# Patient Record
Sex: Male | Born: 1965 | Race: White | Hispanic: No | Marital: Married | Smoking: Current every day smoker
Health system: Southern US, Community
[De-identification: ages and names within clinical notes are randomized; demographics above are authoritative.]

## PROBLEM LIST (undated history)

## (undated) DIAGNOSIS — Z1211 Encounter for screening for malignant neoplasm of colon: Secondary | ICD-10-CM

## (undated) DIAGNOSIS — G5603 Carpal tunnel syndrome, bilateral upper limbs: Secondary | ICD-10-CM

## (undated) DIAGNOSIS — M792 Neuralgia and neuritis, unspecified: Secondary | ICD-10-CM

## (undated) DIAGNOSIS — E039 Hypothyroidism, unspecified: Secondary | ICD-10-CM

## (undated) DIAGNOSIS — R748 Abnormal levels of other serum enzymes: Secondary | ICD-10-CM

## (undated) DIAGNOSIS — E871 Hypo-osmolality and hyponatremia: Secondary | ICD-10-CM

## (undated) DIAGNOSIS — M4802 Spinal stenosis, cervical region: Secondary | ICD-10-CM

## (undated) DIAGNOSIS — M549 Dorsalgia, unspecified: Secondary | ICD-10-CM

## (undated) DIAGNOSIS — R0683 Snoring: Secondary | ICD-10-CM

## (undated) DIAGNOSIS — I700 Atherosclerosis of aorta: Secondary | ICD-10-CM

## (undated) DIAGNOSIS — R202 Paresthesia of skin: Secondary | ICD-10-CM

## (undated) DIAGNOSIS — Z8739 Personal history of other diseases of the musculoskeletal system and connective tissue: Secondary | ICD-10-CM

## (undated) DIAGNOSIS — M19031 Primary osteoarthritis, right wrist: Secondary | ICD-10-CM

## (undated) DIAGNOSIS — M19041 Primary osteoarthritis, right hand: Secondary | ICD-10-CM

## (undated) DIAGNOSIS — K760 Fatty (change of) liver, not elsewhere classified: Secondary | ICD-10-CM

## (undated) DIAGNOSIS — E139 Other specified diabetes mellitus without complications: Secondary | ICD-10-CM

## (undated) DIAGNOSIS — B070 Plantar wart: Secondary | ICD-10-CM

## (undated) DIAGNOSIS — M19032 Primary osteoarthritis, left wrist: Secondary | ICD-10-CM

## (undated) DIAGNOSIS — K3189 Other diseases of stomach and duodenum: Secondary | ICD-10-CM

## (undated) DIAGNOSIS — E785 Hyperlipidemia, unspecified: Secondary | ICD-10-CM

## (undated) DIAGNOSIS — G43909 Migraine, unspecified, not intractable, without status migrainosus: Secondary | ICD-10-CM

## (undated) DIAGNOSIS — F17200 Nicotine dependence, unspecified, uncomplicated: Secondary | ICD-10-CM

## (undated) DIAGNOSIS — I2510 Atherosclerotic heart disease of native coronary artery without angina pectoris: Secondary | ICD-10-CM

## (undated) HISTORY — PX: OTHER SURGICAL HISTORY: SHX169

## (undated) HISTORY — DX: Personal history of other diseases of the musculoskeletal system and connective tissue: Z87.39

## (undated) HISTORY — DX: Fatty (change of) liver, not elsewhere classified: K76.0

## (undated) HISTORY — DX: Primary osteoarthritis, right wrist: M19.031

## (undated) HISTORY — DX: Hyperlipidemia, unspecified: E78.5

## (undated) HISTORY — DX: Other diseases of stomach and duodenum: K31.89

## (undated) HISTORY — DX: Encounter for screening for malignant neoplasm of colon: Z12.11

## (undated) HISTORY — DX: Carpal tunnel syndrome, bilateral upper limbs: G56.03

## (undated) HISTORY — PX: WRIST SURGERY: SHX841

## (undated) HISTORY — DX: Nicotine dependence, unspecified, uncomplicated: F17.200

## (undated) HISTORY — DX: Other specified diabetes mellitus without complications: E13.9

## (undated) HISTORY — DX: Paresthesia of skin: R20.2

## (undated) HISTORY — DX: Abnormal levels of other serum enzymes: R74.8

## (undated) HISTORY — DX: Atherosclerosis of aorta: I70.0

## (undated) HISTORY — DX: Neuralgia and neuritis, unspecified: M79.2

## (undated) HISTORY — DX: Migraine, unspecified, not intractable, without status migrainosus: G43.909

## (undated) HISTORY — DX: Plantar wart: B07.0

## (undated) HISTORY — PX: CARPAL TUNNEL RELEASE: SHX101

## (undated) HISTORY — DX: Spinal stenosis, cervical region: M48.02

## (undated) HISTORY — DX: Atherosclerotic heart disease of native coronary artery without angina pectoris: I25.10

## (undated) HISTORY — DX: Primary osteoarthritis, right hand: M19.041

## (undated) HISTORY — DX: Primary osteoarthritis, right wrist: M19.032

## (undated) HISTORY — DX: Hypo-osmolality and hyponatremia: E87.1

## (undated) HISTORY — DX: Hypothyroidism, unspecified: E03.9

## (undated) HISTORY — DX: Snoring: R06.83

---

## 1998-01-15 ENCOUNTER — Emergency Department (HOSPITAL_COMMUNITY): Admission: EM | Admit: 1998-01-15 | Discharge: 1998-01-15 | Payer: Self-pay | Admitting: Emergency Medicine

## 1998-01-15 ENCOUNTER — Encounter: Payer: Self-pay | Admitting: Emergency Medicine

## 2002-04-21 ENCOUNTER — Emergency Department (HOSPITAL_COMMUNITY): Admission: EM | Admit: 2002-04-21 | Discharge: 2002-04-21 | Payer: Self-pay | Admitting: Emergency Medicine

## 2002-04-21 ENCOUNTER — Encounter: Payer: Self-pay | Admitting: Emergency Medicine

## 2003-04-30 ENCOUNTER — Encounter: Admission: RE | Admit: 2003-04-30 | Discharge: 2003-04-30 | Payer: Self-pay | Admitting: Family Medicine

## 2004-06-27 ENCOUNTER — Emergency Department (HOSPITAL_COMMUNITY): Admission: EM | Admit: 2004-06-27 | Discharge: 2004-06-27 | Payer: Self-pay | Admitting: Emergency Medicine

## 2004-07-02 ENCOUNTER — Ambulatory Visit (HOSPITAL_COMMUNITY): Admission: RE | Admit: 2004-07-02 | Discharge: 2004-07-03 | Payer: Self-pay | Admitting: Otolaryngology

## 2004-07-02 ENCOUNTER — Encounter (INDEPENDENT_AMBULATORY_CARE_PROVIDER_SITE_OTHER): Payer: Self-pay | Admitting: Specialist

## 2005-02-11 HISTORY — PX: NASAL SINUS SURGERY: SHX719

## 2007-02-12 HISTORY — PX: BACK SURGERY: SHX140

## 2007-03-04 ENCOUNTER — Inpatient Hospital Stay (HOSPITAL_COMMUNITY): Admission: RE | Admit: 2007-03-04 | Discharge: 2007-03-06 | Payer: Self-pay | Admitting: Neurosurgery

## 2007-03-04 ENCOUNTER — Ambulatory Visit: Payer: Self-pay | Admitting: *Deleted

## 2008-03-16 ENCOUNTER — Emergency Department (HOSPITAL_BASED_OUTPATIENT_CLINIC_OR_DEPARTMENT_OTHER): Admission: EM | Admit: 2008-03-16 | Discharge: 2008-03-16 | Payer: Self-pay | Admitting: Emergency Medicine

## 2008-03-16 ENCOUNTER — Ambulatory Visit: Payer: Self-pay | Admitting: Radiology

## 2009-10-09 ENCOUNTER — Ambulatory Visit: Payer: Self-pay | Admitting: Internal Medicine

## 2009-10-09 ENCOUNTER — Encounter (INDEPENDENT_AMBULATORY_CARE_PROVIDER_SITE_OTHER): Payer: Self-pay | Admitting: *Deleted

## 2009-10-09 DIAGNOSIS — B070 Plantar wart: Secondary | ICD-10-CM | POA: Insufficient documentation

## 2009-10-09 DIAGNOSIS — M6740 Ganglion, unspecified site: Secondary | ICD-10-CM | POA: Insufficient documentation

## 2009-10-09 DIAGNOSIS — E039 Hypothyroidism, unspecified: Secondary | ICD-10-CM | POA: Insufficient documentation

## 2009-10-09 DIAGNOSIS — R599 Enlarged lymph nodes, unspecified: Secondary | ICD-10-CM | POA: Insufficient documentation

## 2009-10-09 HISTORY — DX: Plantar wart: B07.0

## 2009-10-09 LAB — CONVERTED CEMR LAB
Alkaline Phosphatase: 115 units/L (ref 39–117)
BUN: 3 mg/dL — ABNORMAL LOW (ref 6–23)
Basophils Relative: 0 % (ref 0–1)
CO2: 30 meq/L (ref 19–32)
Chloride: 99 meq/L (ref 96–112)
Creatinine, Ser: 0.8 mg/dL (ref 0.40–1.50)
HCT: 41.2 % (ref 39.0–52.0)
Hemoglobin: 14.4 g/dL (ref 13.0–17.0)
Indirect Bilirubin: 0.4 mg/dL (ref 0.0–0.9)
LDL Cholesterol: 72 mg/dL (ref 0–99)
MCHC: 35 g/dL (ref 30.0–36.0)
Monocytes Absolute: 0.8 10*3/uL (ref 0.1–1.0)
Monocytes Relative: 8 % (ref 3–12)
Neutro Abs: 5.2 10*3/uL (ref 1.7–7.7)
RBC: 4.57 M/uL (ref 4.22–5.81)
Thyroperoxidase Ab SerPl-aCnc: 1680 — ABNORMAL HIGH (ref <35.0)
Total Bilirubin: 0.5 mg/dL (ref 0.3–1.2)
Triglycerides: 90 mg/dL (ref <150)

## 2009-10-12 ENCOUNTER — Encounter: Payer: Self-pay | Admitting: Internal Medicine

## 2009-10-12 ENCOUNTER — Telehealth: Payer: Self-pay | Admitting: Internal Medicine

## 2009-10-17 ENCOUNTER — Encounter: Payer: Self-pay | Admitting: Internal Medicine

## 2009-10-24 ENCOUNTER — Encounter: Payer: Self-pay | Admitting: Internal Medicine

## 2009-10-30 ENCOUNTER — Telehealth: Payer: Self-pay | Admitting: Internal Medicine

## 2010-03-13 NOTE — Letter (Signed)
Summary: Generic Letter  [place] at [facility]  [address]   [place], [place] [address]   Phone: [phone]  Fax: [phone]      10/17/2009    Daniel Stevenson [address] [place], [place]  [address]     Dear Mr. Miler,  I have attempted to contact you at [phone], a recording was reached stating the number was no longer is service. A call was placed to [phone], voice recording reached, however a different name was stated in the recording. Please call our office to update your contact number.  Dr Artist Pais asked that you be made aware your blood test has confirmed that you have hypothyroidism. [facility] rx [facility]. Dr Artist Pais would like for you to return in 2 months to recheck your thyroid level.   If there are any questions regarding this letter. please call our office.         Sincerely,   Glendell Docker CMA Dr Thomos Lemons

## 2010-03-13 NOTE — Letter (Signed)
Summary: Primary Care Consult Scheduled Letter  [place] at [facility]  [address]   [place], [place] [address]   Phone: [phone]  Fax: [phone]      10/09/2009 MRN: [id number]  Daniel Stevenson [address] [place], [place]  [address]    Dear [name],      We have scheduled an appointment for you.  At the recommendation of Dr.YOO, we have scheduled you a consult with [facility] , DR Ralene Cork on _SEPTEMBER 13, 2011 at 9AM, ARRIVE  8:45AM.  Their address is_[address] . The office phone number is [phone].  If this appointment day and time is not convenient for you, please feel free to call the office of the doctor you are being referred to at the number listed above and reschedule the appointment.     It is important for you to keep your scheduled appointments. We are here to make sure you are given good patient care.     Thank you,  Darral Dash Patient Care Coordinator [facility]

## 2010-03-13 NOTE — Assessment & Plan Note (Signed)
Summary: new to est/mhf-- Rm 3   Vital Signs:  Patient profile:   45 year old male Height:      65 inches Weight:      150.50 pounds BMI:     25.14 O2 Sat:      100 % on Room air Temp:     98.1 degrees F oral Pulse rate:   87 / minute Pulse rhythm:   regular Resp:     12 per minute BP sitting:   112 / 72  (right arm) Cuff size:   regular  Vitals Entered By: Mervin Kung CMA (AAMA) (October 09, 2009 1:28 PM)  O2 Flow:  Room air Is Patient Diabetic? No Pain Assessment Patient in pain? yes     Location: back, foot and wrist Comments Pt states he is having pain and swelling in right foot x 2 months. Pt has knot on left wrist x 1 year. Nicki Guadalajara Fergerson CMA Duncan Dull)  October 09, 2009 1:39 PM    Primary Care Provider:  Dondra Spry DO   History of Present Illness: 45 y/o white male to establish (wife is Seward Meth) Prev PCP - Dr. Yehuda Budd  hx of hypothyroidism   started taking synthroid 2007 stopped taking thyroid medication 2009 after back surgery stopped taking all meds - no specific reason (hard headed)  hx of low back pain - Jan, 2009 - Dr. Lovell Sheehan [facility]) takes hydrocodone since surgery also valium for muscle spasm last seen by Dr. Lovell Sheehan  - 6/20.  he gets checks q 6 months  knot on left wrist  3 months - right foot pain,  wart - becoming more painful      Preventive Screening-Counseling & Management  Alcohol-Tobacco     Alcohol drinks/day: 6 pack q weekend     Alcohol type: beer     Smoking Status: current     Smoking Cessation Counseling: yes     Packs/Day: 1.0     Pack years: 18  Caffeine-Diet-Exercise     Caffeine use/day: 3-4 sodas daily     Does Patient Exercise: yes     Type of exercise: walking     Exercise (avg: min/session): 30-60     Times/week: 3  Allergies (verified): 1)  ! Tylox  Past History:  Past Medical History: ? Hx of thyroid disorder--stopped med 2009 Herniated Disc-- January, 2009 Hx of Hematuria? Tobacco user  - since age  Past Surgical History: herniated disc--2009  Family History: Mother-- alive and well Father-- alive and well 1 daughter-- alive and well no colon cancer no prostate cancer  Social History: Occupation: Probation officer Married - wife (Diane Field seismologist) Current Smoker Alcohol use-yes Regular exercise-yes Smoking Status:  current Packs/Day:  1.0 Caffeine use/day:  3-4 sodas daily Does Patient Exercise:  yes  Review of Systems       The patient complains of dyspnea on exertion.  The patient denies fever, weight gain, chest pain, abdominal pain, melena, hematochezia, severe indigestion/heartburn, depression, and unusual weight change.    Physical Exam  General:  alert, well-developed, and well-nourished.   Head:  normocephalic and atraumatic.   Mouth:  pharynx pink and moist and poor dentition.   Neck:  No deformities, masses, or tenderness noted. Lungs:  normal respiratory effort, normal breath sounds, no crackles, and no wheezes.   Heart:  normal rate, regular rhythm, and no gallop.   Abdomen:  soft, non-tender, normal bowel sounds, no hepatomegaly, and no splenomegaly.   Extremities:  No lower extremity edema  Neurologic:  cranial nerves II-XII intact and gait normal.   Skin:  right foot - plantar wart Cervical Nodes:  right ant LN slightly prominent Psych:  normally interactive, good eye contact, not anxious appearing, and not depressed appearing.     Impression & Recommendations:  Problem # 1:  PLANTAR WART, RIGHT (BTD-176.16)  Orders: Podiatry Referral (Podiatry)  Problem # 2:  CERVICAL LYMPHADENOPATHY (ICD-785.6)  slight prominence of right ant cervical chain LN.  pt has very poor dentition.   I suspect LN reactive to dental issues.  pt has appt with dentist to have all his teeth pulled.  tx with augmentin.  reassess in 2 months.  If persistent prominence, we discussed obtaining CT of Neck.  pt advised to notify us if any significant change in size of  LN.  His updated medication list for this problem includes:    Amoxicillin-pot Clavulanate 500-125 Mg Tabs (Amoxicillin-pot clavulanate) ..... One by mouth two times a day  Orders: T-CBC w/Diff ([id number])  Problem # 3:  HYPOTHYROIDISM (ICD-244.9)  non compliance in the past.   restart thyroid replacement.    Orders: T-Basic Metabolic Panel [phone]) T-TSH [phone]) T- * Misc. Laboratory test [phone])  His updated medication list for this problem includes:    Levothyroxine Sodium 50 Mcg Tabs (Levothyroxine sodium) .Marland Kitchen... 1/2 by mouth once daily x 2 weeks, then one tab by mouth once daily  Complete Medication List: 1)  Valium 5 Mg Tabs (Diazepam) .... Take 1 tablet by mouth three times a day 2)  Vicodin 5-500 Mg Tabs (Hydrocodone-acetaminophen) .... Take 1 tablet by mouth two times a day 3)  Amoxicillin-pot Clavulanate 500-125 Mg Tabs (Amoxicillin-pot clavulanate) .... One by mouth two times a day 4)  Levothyroxine Sodium 50 Mcg Tabs (Levothyroxine sodium) .... 1/2 by mouth once daily x 2 weeks, then one tab by mouth once daily  Other Orders: T-Lipid Profile ([id number]) T-Hepatic Function ([id number])  Contraindications/Deferment of Procedures/Staging:    Test/Procedure: FLU VAX    Reason for deferment: patient declined   Patient Instructions: 1)  Please schedule a follow-up appointment in 2 months. Prescriptions: AMOXICILLIN-POT CLAVULANATE 500-125 MG TABS (AMOXICILLIN-POT CLAVULANATE) one by mouth two times a day  #20 x 0   Entered and Authorized by:   D. Thomos Lemons DO   Signed by:   D. Thomos Lemons DO on 10/09/2009   Method used:   [facility]  [address] [id number]       [address]       [place], [place]  [address]       Ph: [phone] or [phone]       Fax: [phone]   RxID:   [id number]    Current Allergies (reviewed today): ! TYLOX   Preventive Care Screening  Last Tetanus Booster:    Date:   02/12/2007    Results:  Historical     Vital Signs:  Patient Profile:   45 year old male Height:     65 inches Weight:      150.50 pounds BMI:     25.14 O2 Sat:      100 % Temp:     98.1 degrees F oral Pulse rate:   87 / minute Pulse rhythm:   regular Resp:     12 per minute BP sitting:   112 / 72 Cuff size:   regular    Location:  back, foot and wrist

## 2010-03-13 NOTE — Letter (Signed)
   [place] at [facility] [address] [place], [place]  [address]  [place] Phone: [phone]      October 12, 2009   Daniel Stevenson [address] [place], [place] [address]  RE:  LAB RESULTS  Dear  Mr. Dulac,  The following is an interpretation of your most recent lab tests.  Please take note of any instructions provided or changes to medications that have resulted from your lab work.  ELECTROLYTES:  Good - no changes needed  KIDNEY FUNCTION TESTS:  Good - no changes needed  LIVER FUNCTION TESTS:  Good - no changes needed  LIPID PANEL:  Good - no changes needed Triglyceride: 90   Cholesterol: 132   LDL: 72   HDL: 42   Chol/HDL%:  3.1 Ratio  THYROID STUDIES:  Thyroid level too low TSH: 24.369     CBC:  Good - no changes needed Thyroid antibodies - positive       Sincerely Yours,    Dr. Thomos Lemons

## 2010-03-13 NOTE — Progress Notes (Signed)
Summary: Reaction to thyroid med  Phone Note Call from Patient Call back at [phone]   Caller: Spouse Summary of Call: Pt having allergic reaction, sick on stomach, hoarse, not sleeping well, is "itching all over" , today has been worse, he took his first full dose of thyroid meds, the reaction wasn't as bad when he was taking 1/2 pill Initial call taken by: Lannette Donath,  October 30, 2009 4:56 PM  Follow-up for Phone Call        change to armour thyroid -  see rx Follow-up by: D. Thomos Lemons DO,  October 30, 2009 5:50 PM  Additional Follow-up for Phone Call Additional follow up Details #1::        patient wife called advised what Dr Artist Pais had said and told her where the new rx is Daniel Stevenson  October 31, 2009 9:23 AM    New/Updated Medications: NP THYROID 30 MG TABS (THYROID) one by mouth once daily Prescriptions: NP THYROID 30 MG TABS (THYROID) one by mouth once daily  #30 x 1   Entered and Authorized by:   D. Thomos Lemons DO   Signed by:   D. Thomos Lemons DO on 10/30/2009   Method used:   [facility]  [id number]       [address]       [place], [place]  [address]       Ph: [phone] or [phone]       Fax: [phone]   RxID:   [id number]

## 2010-03-13 NOTE — Consult Note (Signed)
Summary: [facility]  [facility]   Imported By: Lanelle Bal 11/09/2009 12:44:19  _____________________________________________________________________  External Attachment:    Type:   Image     Comment:   External Document

## 2010-03-13 NOTE — Progress Notes (Signed)
Summary: Thyroid Results  Phone Note Outgoing Call   Summary of Call: call pt - thyroid blood test confirms hypothyroid.  see rx for thyroid medication.  repeat TSH in 2 months 244.9 Initial call taken by: D. Thomos Lemons DO,  October 12, 2009 6:39 PM  Follow-up for Phone Call        attempted to contact patient at [phone], recording reached stating the number is no longer in service,c all placed to [phone], voice recording stating you have reached the [facility] family, no voice message left. A letter has been mailed to patient regarding instructions Follow-up by: Glendell Docker CMA,  October 17, 2009 8:05 AM    New/Updated Medications: LEVOTHYROXINE SODIUM 50 MCG TABS (LEVOTHYROXINE SODIUM) 1/2 by mouth once daily x 2 weeks, then one tab by mouth once daily Prescriptions: LEVOTHYROXINE SODIUM 50 MCG TABS (LEVOTHYROXINE SODIUM) 1/2 by mouth once daily x 2 weeks, then one tab by mouth once daily  #30 x 2   Entered and Authorized by:   D. Thomos Lemons DO   Signed by:   D. Thomos Lemons DO on 10/12/2009   Method used:   [id number]       [address]       [place], [place]  [address]       Ph: [phone] or [phone]       Fax: [phone]   RxID:   [id number]

## 2010-06-26 NOTE — Op Note (Signed)
[name], Daniel Stevenson NO.:  [id number]   MEDICAL RECORD NO.:  [id number]          PATIENT TYPE:  INP   LOCATION:  3172                         FACILITY:  [facility]   PHYSICIAN:  Cristi Loron, M.D.DATE OF BIRTH:  [birth year]   DATE OF PROCEDURE:  DATE OF DISCHARGE:                               OPERATIVE REPORT   BRIEF HISTORY:  The patient is a 45 year old white male who was  suffering from back and leg pain.  He failed medical management worked  up with a lumbar MRI which demonstrated he had spondylosis.  He had  degenerative disc disease and had mild spondylolisthesis at L4-5.  He  failed nonsurgical management.  I discussed surgery with him.  The  patient has weighed the risks, benefits, and alternatives of surgery,  and has decided to proceed with L4-5 anterior lumbar interbody fusion  and placement of interbody prosthesis and anterior instrumentation.   PREOPERATIVE DIAGNOSES:  L4-5 degenerative disc disease,  spondylolisthesis, lumbago, and radiculopathy.   POSTOPERATIVE DIAGNOSES:  L4-5 degenerative disc disease,  spondylolisthesis, lumbago, and radiculopathy.   PROCEDURE:  L4-5 anterior lumbar interbody fusion; insertion of L4-5  interbody prosthesis (SynFix 15 mm 8-degree prosthesis); anterior spinal  instrumentation with vertebral body screws.   SURGEON:  Cristi Loron, M.D.   ASSISTANT:  Stefani Dama, M.D. (Vascular Access surgeon is Dr. Liliane Bade).   DRAINS:  None.   COMPLICATIONS:  None.   SPECIMEN:  None.   DESCRIPTION OF PROCEDURE:  The patient was brought to operating room by  anesthesia team.  General endotracheal anesthesia was induced.  The  patient remained in supine position where the anterior abdomen was then  prepared with Betadine scrub and Betadine solution.  Sterile drapes were  applied.  Dr. Madilyn Fireman performed the vascular access surgery.  For that  part of the operation, please refer to his operative note.   After  Dr. Madilyn Fireman had provided exposure at L4-5 disc anteriorly and placed  retractors, I began my part of the operation.  We confirmed that we are  indeed at the right level using fluoroscopy.  I then incised the L4-5  intervertebral disc with 15 blade scalpel.  We perform a intervertebral  discectomy using a pituitary forceps and the curettes.  After we had a  good intervertebral discectomy, we prepared the vertebral endplates for  fusion by removing all the soft tissue using curettes and the rasp.  We  then used trial spacers and determined to use a 15-mm 8-degree interbody  prosthesis.  We prefilled this prosthesis with combination of bone  morphogenic protein soaked collagen sponges and  bone graft extender.  We then inserted the prosthesis into the interspace.  There was a good  snug fit.  We confirmed this as a good position with AP and lateral  fluoroscopy.  We then used the awl to create the pilot holes in L4-L5  vertebral bodies.  We then secured the prosthesis with the titanium  screws which were tightened appropriately.  We then filled laterally to  the prosthesis with Actifuse bone graft extender.  This completed  the  instrumentation, insertion of prosthesis, and anterior fusion.  We  obtained hemostasis using bipolar electrocautery and then irrigated the  wound out with bacitracin solution.  We removed the retractors.  Felt  the iliac vessels, there were good pulsations.  We then reapproximated  the patient's anterior rectus sheath with a running 0-0 Vicryl suture  and the subcutaneous tissue with interrupted 2-0 Vicryl suture.  The  skin was reapproximated using Dermabond.  Drapes were then removed.  The  patient was subsequently extubated by the anesthesia team and  transported to post-anesthesia care unit in stable condition.  All  sponge, instrument, and needle counts were correct in this case.      Cristi Loron, M.D.  Electronically Signed     JDJ/MEDQ  D:   03/04/2007  T:  03/05/2007  Job:  [id number]

## 2010-06-27 ENCOUNTER — Emergency Department (INDEPENDENT_AMBULATORY_CARE_PROVIDER_SITE_OTHER): Payer: 59

## 2010-06-27 ENCOUNTER — Emergency Department (HOSPITAL_BASED_OUTPATIENT_CLINIC_OR_DEPARTMENT_OTHER)
Admission: EM | Admit: 2010-06-27 | Discharge: 2010-06-27 | Disposition: A | Discharge status: Home / Self Care | Payer: 59 | Attending: Emergency Medicine | Admitting: Emergency Medicine

## 2010-06-27 DIAGNOSIS — E039 Hypothyroidism, unspecified: Secondary | ICD-10-CM | POA: Insufficient documentation

## 2010-06-27 DIAGNOSIS — R1110 Vomiting, unspecified: Secondary | ICD-10-CM

## 2010-06-27 DIAGNOSIS — R51 Headache: Secondary | ICD-10-CM

## 2010-06-27 DIAGNOSIS — R112 Nausea with vomiting, unspecified: Secondary | ICD-10-CM | POA: Insufficient documentation

## 2010-06-27 DIAGNOSIS — J329 Chronic sinusitis, unspecified: Secondary | ICD-10-CM | POA: Insufficient documentation

## 2010-06-29 NOTE — H&P (Signed)
[name], Daniel Stevenson NO.:  [id number]   MEDICAL RECORD NO.:  [id number]          PATIENT TYPE:  OIB   LOCATION:  2899                         FACILITY:  [facility]   PHYSICIAN:  Hermelinda Medicus, M.D.   DATE OF BIRTH:  24-May-1965   DATE OF ADMISSION:  07/02/2004  DATE OF DISCHARGE:                                HISTORY & PHYSICAL   This patient is a 45 year old male who has had considerable migraine  headaches in the past.  He has also had sinusitis problems in the past.  Has  a severe septal deviation.  Had been evaluated for headache and has been  seen also in the emergency room with severe headache and was felt to have a  combination of migraine and sinus difficulties where on CAT scan he showed a  left frontal sinus obstruction with polyp, bilateral ethmoid sinusitis, and  a severe spur septal deviation.  The spur on the left was pushing into the  side of his nose.  He now enters for a functional endoscopic sinus surgery  with a left frontolotomy, bilateral ethmoidectomies, reduction of inferior  turbinates, and a septal reconstruction.  The patient has been on  antibiotics, the Avelox 400 mg daily.  He has also been with severe pain,  was given Dilaudid 2 mg.  He is also on Synthroid 50 mcg daily.  He has no  allergies to medication.  He does smoke a pack a day and does not drink  alcohol.  He does have some allergy history also to pollen and also to bee  sting.  His past history is quite unremarkable.  Further, he has never had  surgery before.  He has had the headache with vertigo typical of migraine,  but also has had some sinus congestion and his really severe septal  deviation with sinusitis as seen on CAT scan and MRI.   PHYSICAL EXAMINATION:  VITAL SIGNS:  Blood pressure 117/73, weight 149,  height 5 feet 5 inches, heart rate 83, temperature 97.3.  HEENT:  Ears are clear.  Tympanic membranes are clear.  His nose shows a  severe septal deviation with  spurring to the left and an ethmoid deviation  off to the right.  His sinuses on CAT scan are showing frontoethmoid left  and ethmoid right.  The MRI done shows a normal examination of his head just  showing the sinus problem.  The oral cavity is clear.  He has some dentition  problems, but no ulceration or abnormalities, tumor, or mass.  His larynx is  clear.  True cord, false cord, epiglottis, base of tongue are clear.  True  cord mobility, gag reflex, tongue mobility, EOMs, facial nerve are all  symmetrical.  LUNGS:  Clear.  No rales, rhonchi, or wheezes.  CARDIOVASCULAR:  No snaps, rubs, murmurs, or gallops.  EXTREMITIES:  Essentially unremarkable.   INITIAL DIAGNOSES:  Bilateral ethmoid sinusitis, left frontal sinusitis with  septal deviation with history of severe pain and history of migraine  headache, history of hypothyroidism.      JC/MEDQ  D:  07/02/2004  T:  07/02/2004  Job:  [id number]   cc:   Clabe Seal. Meryl Crutch, M.D.  [address]  [place]  [place] [address]  Fax: [phone]   Nelva Nay, MD  Fax: [phone]   Reather Littler, M.D.  [address]  [place]  [place] [address]  Fax: [phone]   Quintella Reichert, M.D.  [facility]

## 2010-06-29 NOTE — H&P (Signed)
[name], TESTA NO.:  [id number]   MEDICAL RECORD NO.:  [id number]          PATIENT TYPE:  EMS   LOCATION:  MAJO                         FACILITY:  [facility]   PHYSICIAN:  Nelva Nay, MD      DATE OF BIRTH:  [birth year]   DATE OF ADMISSION:  06/27/2004  DATE OF DISCHARGE:                                HISTORY & PHYSICAL   CHIEF COMPLAINT:  Headache.   HISTORY:  A 45 year old male [facility].  Recently he had an MRI which showed a frontal sinusitis.  Patient  sneezed last night and the headache got worse.  He came in for unremitting  headache.  He has an appointment scheduled for a week from tomorrow with Dr.  Esaw Dace, at [facility].   PHYSICAL EXAMINATION:  GENERAL APPEARANCE:  He is awake, alert and oriented.  VITAL SIGNS:  Afebrile with a blood pressure of 113/39, temperature 97.7.  HEENT:  Normal cranial nerves.  TMs are clear. His headache is located over  the frontal area.  NECK:  Supple with no meningeal signs.  CHEST:  Clear to auscultation.  ABDOMEN:  Soft.  NEUROLOGIC:  He moves all extremities.  No focal or lateralized neurologic  findings.  No sensory deficit.   TREATMENT IN THE EMERGENCY ROOM:  IV Dilaudid was given which offered him a  moderate amount of headache relief.  MRI scan was obtained. Results from the  [facility] reveal sinusitis, otherwise unremarkable.   PLAN:  I spoke with Dr. Allayne Stack office and they moved the appointment up  to 48 hours at 1:30 on Friday, which is 48 hours from this time.  I plan on  treating outpatient with Dilaudid p.o. for headache control so will manage  his headache until he can seen Dr. Haroldine Laws for evaluation of his chronic  sinusitis.  Instructed to return should he develop fever, chills or change  in mental status.      RB/MEDQ  D:  06/27/2004  T:  06/27/2004  Job:  [id number]

## 2010-06-29 NOTE — Op Note (Signed)
[name], Daniel Stevenson              ACCOUNT NO.:  [id number]   MEDICAL RECORD NO.:  [id number]          PATIENT TYPE:  OIB   LOCATION:  2899                         FACILITY:  [facility]   PHYSICIAN:  Hermelinda Medicus, M.D.   DATE OF BIRTH:  [birth year]   DATE OF PROCEDURE:  07/02/2004  DATE OF DISCHARGE:                                 OPERATIVE REPORT   PREOPERATIVE DIAGNOSIS:  Left frontal ethmoid sinusitis with ethmoid  sinusitis, right, with a severe septal deviation and spurring to the left  and ethmoid to the right, turbinate hypertrophy.   POSTOPERATIVE DIAGNOSIS:  Left frontal ethmoid sinusitis with ethmoid  sinusitis, right, with a severe septal deviation and spurring to the left  and ethmoid to the right, turbinate hypertrophy.   OPERATION:  Septal reconstruction, turbinate reduction, with a left frontal  ethmoidectomy and right ethmoidectomy via functional endoscopic sinus  surgery.   SURGEON:  Hermelinda Medicus, M.D.   ANESTHESIA:  Local MAC with Dr. Ivin Booty and Dr. Noreene Larsson.   DESCRIPTION OF PROCEDURE:  The patient was placed in the supine position and  under local anesthesia, was prepped and draped under the usual plan and then  1% Xylocaine with epinephrine and topical cocaine 200 mg was used. A total  use of 1% Xylocaine with epinephrine was 11 mL. Once anesthesia was  completed, we first approached the septum with a hemitransfixion incision on  the right, carried around to the left back to the quadrilateral cartilage. A  strip of cartilage was taken and then a portion of ethmoid septum, which was  deviated to the right, was also taken. Then, the spur which was on the left  representing the vomerian septum was also corrected by using the 4 mm  chisel, and the open and close Laren Boom, as well as the [employer]  forceps. Once we removed this huge spur as seen on CAT scan at least a  centimeter in length or deviation, we were able to see better into the  ethmoid and  frontal sinus region, and the septum was established on the  midline. Closure of this incision was with 5-0 plain catgut and a through-  and-through septal suture using 4-0 plain. Once that was completed, we then  approached the left ethmoid sinus. We re-anesthetized around the ethmoid and  pushed the inferior turbinate and inferiorly we used the [employer]Med 12 watts as  a coagulation, the bipolar cautery, and then we approached the middle  turbinates by pushing those medial. The frontal ethmoid area was viewed  using the zero and 70 degrees scope. We could see the polypoid debris going  up into the left frontal sinuses, seen on CAT scan, and we were able to  remove this using the upbiting 90 and 70 degrees Blakesley-Wilder forceps.  We also removed a considerable amount of debris from his ethmoid sinus which  was impairing this drainage from his frontal. On the right side, the ethmoid  sinus again had a considerable amount of debris, and using the zero  and 70  degree scopes, we removed this but the frontal sinus of the natural ostium  was in quite good condition as was seen again on CAT scan. Once this was  completed, we then placed Gelfoam within the ethmoid sinuses, Gelfilm to  hold the medial turbinates, middle  turbinates medial, and then we used Merocel packing to act as a nasal of  dressing. The patient tolerated the procedure well. Loss of blood was  approximately 30 mL and the patient is doing well in the recovery room. Will  follow him tomorrow and then will see him in one week, then 3 weeks, 6  weeks, 3 months, 6 months, and a year.      JC/MEDQ  D:  07/02/2004  T:  07/02/2004  Job:  [id number]   cc:   Clabe Seal. Meryl Crutch, M.D.  [address]  [place]  [place] [address]  Fax: [phone]   Lacretia Leigh. Quintella Reichert, M.D.   Nelva Nay, MD  Fax: [phone]   Reather Littler, M.D.  [address], [address]  [place]  [place] [address]  Fax: [phone]

## 2010-10-10 ENCOUNTER — Encounter: Payer: Self-pay | Admitting: *Deleted

## 2010-10-10 ENCOUNTER — Emergency Department (INDEPENDENT_AMBULATORY_CARE_PROVIDER_SITE_OTHER): Payer: 59

## 2010-10-10 ENCOUNTER — Emergency Department (HOSPITAL_BASED_OUTPATIENT_CLINIC_OR_DEPARTMENT_OTHER)
Admission: EM | Admit: 2010-10-10 | Discharge: 2010-10-10 | Disposition: A | Discharge status: Home / Self Care | Payer: 59 | Attending: Emergency Medicine | Admitting: Emergency Medicine

## 2010-10-10 DIAGNOSIS — R51 Headache: Secondary | ICD-10-CM

## 2010-10-10 DIAGNOSIS — E079 Disorder of thyroid, unspecified: Secondary | ICD-10-CM | POA: Insufficient documentation

## 2010-10-10 DIAGNOSIS — F17200 Nicotine dependence, unspecified, uncomplicated: Secondary | ICD-10-CM | POA: Insufficient documentation

## 2010-10-10 HISTORY — DX: Dorsalgia, unspecified: M54.9

## 2010-10-10 MED ORDER — KETOROLAC TROMETHAMINE 30 MG/ML IJ SOLN
30.0000 mg | Freq: Once | INTRAMUSCULAR | Status: AC
  Administered 2010-10-10: 30 mg via INTRAVENOUS
  Filled 2010-10-10: qty 1

## 2010-10-10 MED ORDER — SODIUM CHLORIDE 0.9 % IV SOLN
Freq: Once | INTRAVENOUS | Status: AC
  Administered 2010-10-10: 16:00:00 via INTRAVENOUS

## 2010-10-10 MED ORDER — DEXAMETHASONE SODIUM PHOSPHATE 10 MG/ML IJ SOLN
10.0000 mg | Freq: Once | INTRAMUSCULAR | Status: AC
  Administered 2010-10-10: 10 mg via INTRAVENOUS
  Filled 2010-10-10: qty 1

## 2010-10-10 MED ORDER — DIPHENHYDRAMINE HCL 50 MG/ML IJ SOLN
25.0000 mg | Freq: Once | INTRAMUSCULAR | Status: AC
  Administered 2010-10-10: 25 mg via INTRAVENOUS
  Filled 2010-10-10: qty 1

## 2010-10-10 NOTE — ED Notes (Signed)
Continues to have headache 8/10

## 2010-10-10 NOTE — ED Provider Notes (Signed)
History     CSN: [id number] Arrival date & time: 10/10/2010  2:28 PM  Chief Complaint  Patient presents with  . Headache   Patient is a 44 y.o. male presenting with headaches. The history is provided by the patient.  Headache  The current episode started 2 days ago. The problem occurs constantly. The problem has been gradually worsening. The headache is associated with nothing. The pain is located in the left unilateral region. The quality of the pain is described as dull and throbbing. The pain is severe. The pain does not radiate. Pertinent negatives include no fever, no malaise/fatigue, no near-syncope, no syncope, no shortness of breath, no nausea and no vomiting. He has tried oral narcotic analgesics for the symptoms. The treatment provided no relief.    Past Medical History  Diagnosis Date  . Thyroid disease   . Back pain     Past Surgical History  Procedure Date  . Back surgery     History reviewed. No pertinent family history.  History  Substance Use Topics  . Smoking status: Current Everyday Smoker -- 1.0 packs/day  . Smokeless tobacco: Not on file  . Alcohol Use: No      Review of Systems  Constitutional: Negative for fever, chills, malaise/fatigue, diaphoresis and fatigue.  HENT: Negative for ear pain, congestion, rhinorrhea, sneezing, neck pain, neck stiffness and tinnitus.   Eyes: Negative.   Respiratory: Negative for cough, chest tightness and shortness of breath.   Cardiovascular: Negative for chest pain, leg swelling, syncope and near-syncope.  Gastrointestinal: Negative for nausea, vomiting, abdominal pain, diarrhea and blood in stool.  Genitourinary: Negative for frequency, hematuria, flank pain and difficulty urinating.  Musculoskeletal: Negative for back pain and arthralgias.  Skin: Negative for rash.  Neurological: Positive for headaches. Negative for dizziness, speech difficulty, weakness and numbness.    Physical Exam  BP 136/88  Pulse 67   Temp(Src) 98.3 F (36.8 C) (Oral)  Resp 16  Wt 130 lb (58.968 kg)  SpO2 100%  Physical Exam  Constitutional: He is oriented to person, place, and time. He appears well-developed and well-nourished.  HENT:  Head: Normocephalic and atraumatic.  Eyes: Pupils are equal, round, and reactive to light.  Neck: Normal range of motion. Neck supple.  Cardiovascular: Normal rate, regular rhythm and normal heart sounds.   Pulmonary/Chest: Effort normal and breath sounds normal. No respiratory distress. He has no wheezes. He has no rales. He exhibits no tenderness.  Abdominal: Soft. Bowel sounds are normal. There is no tenderness. There is no rebound and no guarding.  Musculoskeletal: Normal range of motion. He exhibits no edema.  Lymphadenopathy:    He has no cervical adenopathy.  Neurological: He is alert and oriented to person, place, and time. He has normal strength. No cranial nerve deficit or sensory deficit. Coordination normal.  Skin: Skin is warm and dry. No rash noted.  Psychiatric: He has a normal mood and affect.    ED Course  Procedures  MDM  Results for orders placed in visit on 10/09/09  CONVERTED [facility] LAB      Component Value Range   Thyroid Peroxidase Antibody 1680.0 (*) <35.0    Thyroglobulin Ab <20.0 U/mL  <40.0    WBC 9.0  4.0-10.5 (10*3/microliter)   RBC 4.57  4.22-5.81 (M/uL)   Hemoglobin 14.4  13.0-17.0 (g/dL)   HCT 91.4  78.2-95.6 (%)   MCV 90.2  78.0-100.0 (fL)   MCHC 35.0  30.0-36.0 (g/dL)   RDW 21.3  08.6-57.8 (%)  Platelets 293  150-400 (K/uL)   Neutrophils Relative 57  43-77 (%)   Neutro Abs 5.2  1.7-7.7 (K/uL)   Lymphocytes Relative 33  12-46 (%)   Lymphs Abs 2.9  0.7-4.0 (K/uL)   Monocytes Relative 8  3-12 (%)   Monocytes Absolute 0.8  0.1-1.0 (K/uL)   Eosinophils Relative 2  0-5 (%)   Eosinophils Absolute 0.1  0.0-0.7 (K/uL)   Basophils Relative 0  0-1 (%)   Basophils Absolute 0.0  0.0-0.1 (K/uL)   Sodium 137  135-145 (meq/L)   Potassium 4.5   3.5-5.3 (meq/L)   Chloride 99  96-112 (meq/L)   CO2 30  19-32 (meq/L)   Glucose, Bld 80  70-99 (mg/dL)   BUN 3 (*) 4-69 (mg/dL)   Creatinine, Ser 6.29  0.40-1.50 (mg/dL)   Calcium 9.4  5.2-84.1 (mg/dL)   Cholesterol 324  4-010 (mg/dL)   Triglycerides 90  <272 (mg/dL)   HDL 42  >53 (mg/dL)   Total CHOL/HDL Ratio 3.1 Ratio     VLDL 18  0-40 (mg/dL)   LDL Cholesterol 72  0-99 (mg/dL)   Total Bilirubin 0.5  0.3-1.2 (mg/dL)   Bilirubin, Direct 0.1  0.0-0.3 (mg/dL)   Indirect Bilirubin 0.4  0.0-0.9 (mg/dL)   Alkaline Phosphatase 115  39-117 (units/L)   AST 16  0-37 (units/L)   ALT 12  0-53 (units/L)   Total Protein 7.4  6.0-8.3 (g/dL)   Albumin 4.6  6.6-4.4 (g/dL)   TSH 03.474 (*) 2.595-6.387 (microintl units/mL)   Ct Head Wo Contrast  10/10/2010   *RADIOLOGY REPORT*  Clinical Data:  Headache  CT HEAD WITHOUT CONTRAST  Technique:  Contiguous axial images were obtained from the base of the skull through the vertex without contrast  Comparison:  06/27/2010  Findings:  The brain has a normal appearance without evidence for hemorrhage, acute infarction, hydrocephalus, or mass lesion.  There is no extra axial fluid collection.  The skull and paranasal sinuses are normal.  IMPRESSION: Normal CT of the head without contrast.  Original Report Authenticated By: Camelia Phenes, M.D.    PT given migraine cocktail.  Feeling much better.  Pain feels the same as his past migraines.  I did a head CT because he said he has had this headache before with a nasal mass which was removed.  Headaches are increasing in frequency.  CT done to r/o mass.  Will refer to neuro.  No unusual symptoms to suggest other etiology such as meningitis or SAH.  Not the worst headache of his life      Rolan Bucco, MD 10/10/10 [phone]

## 2010-10-10 NOTE — ED Notes (Signed)
Pt acuity changed from 4 to 3 based on evaluation and treatment plan.

## 2010-10-10 NOTE — ED Notes (Signed)
Pt c/o h/a and vomiting x 2 days

## 2010-11-01 LAB — CBC
HCT: 43.5
Hemoglobin: 12.8 — ABNORMAL LOW
Hemoglobin: 15
MCHC: 34.6
MCHC: 34.6
MCV: 93.1
MCV: 94.1
RBC: 3.92 — ABNORMAL LOW
RBC: 4.67

## 2010-11-01 LAB — BASIC METABOLIC PANEL
CO2: 33 — ABNORMAL HIGH
Glucose, Bld: 118 — ABNORMAL HIGH
Potassium: 4.1
Sodium: 134 — ABNORMAL LOW

## 2010-11-20 ENCOUNTER — Encounter: Payer: Self-pay | Admitting: Family Medicine

## 2010-11-20 ENCOUNTER — Ambulatory Visit (INDEPENDENT_AMBULATORY_CARE_PROVIDER_SITE_OTHER): Payer: 59 | Admitting: Family Medicine

## 2010-11-20 ENCOUNTER — Telehealth: Payer: Self-pay | Admitting: Family Medicine

## 2010-11-20 DIAGNOSIS — J410 Simple chronic bronchitis: Secondary | ICD-10-CM

## 2010-11-20 DIAGNOSIS — Z720 Tobacco use: Secondary | ICD-10-CM

## 2010-11-20 DIAGNOSIS — E039 Hypothyroidism, unspecified: Secondary | ICD-10-CM

## 2010-11-20 DIAGNOSIS — F17200 Nicotine dependence, unspecified, uncomplicated: Secondary | ICD-10-CM

## 2010-11-20 DIAGNOSIS — G43009 Migraine without aura, not intractable, without status migrainosus: Secondary | ICD-10-CM

## 2010-11-20 DIAGNOSIS — J40 Bronchitis, not specified as acute or chronic: Secondary | ICD-10-CM

## 2010-11-20 MED ORDER — RIZATRIPTAN BENZOATE 10 MG PO TABS: 10.0000 mg | ORAL_TABLET | Freq: Once | ORAL | Status: DC | PRN

## 2010-11-20 MED ORDER — AZITHROMYCIN 250 MG PO TABS: ORAL_TABLET | ORAL | Status: AC

## 2010-11-20 MED ORDER — VARENICLINE TARTRATE 1 MG PO TABS: 1.0000 mg | ORAL_TABLET | Freq: Two times a day (BID) | ORAL | Status: AC

## 2010-11-20 MED ORDER — VARENICLINE TARTRATE 0.5 MG X 11 & 1 MG X 42 PO MISC: ORAL | Status: DC

## 2010-11-20 MED ORDER — PREDNISONE 20 MG PO TABS: ORAL_TABLET | ORAL | Status: DC

## 2010-11-20 NOTE — Telephone Encounter (Signed)
Done

## 2010-11-20 NOTE — Progress Notes (Signed)
Office Note 11/20/2010  CC:  Chief Complaint  Patient presents with  . Establish Care    new patient (transfer from [place]) and smoking cessation  . Cough    HPI:  Daniel Stevenson is a 45 y.o. White male who is here to establish care/transfer from Dr. Olegario Messier care. Old records in [facility] were reviewed prior to or during today's visit.  Pt c/o frequent recurrent episodes of coughing, chest hurting w/cough, increased mucous production, nasal congestion/runny nose, mild ST. Latest episode lasting the last 10d or so.  No fevers, no prolonged SOB or wheezing or chest tightness.  No difference in sx's inside home vs outside (has pets in home, though).  He is a long time smoker of marlboro reds, usually smokes w/in 30 min of waking up, wants to try chantix now--is ready to quit.  Has never tried to quit with smoking aid before.  Denies hx of nightmare problems and denies hx of depression or psych instability of any kind.    Has long hx of HAs, seems to be getting worse last 8-12 mo; 2-3 HA's per week, always start right after waking up in the morning, throbbing pain in frontal area/forehead diffusely, always with nausea and occasionally with vomiting.  No visual sx's and no dizziness.  Bayer migraine med OTC sometimes helpful.  No migraine meds rx'd in the past per pt report.  No triggers identified.  Recent poor control of hypothyroidism but pt says HAs no different before this. No FH of episodic HA syndrome.  Recent hx: hypothyroidism dx'd several years ago and stopped taking synthroid at some point and it is unclear why. Then at some point he started seeing Dr. Morrison Old (endo) in [place] and has been getting dose titrated up: last TSH in [facility] was about 6 wks ago.  Pt says he started his latest dose increase (112 mcg qd) on 11/08/10 and there was no plan made for f/u TSH per pt report. Still c/o fatigue, also feels like his throat is more sore since the increase in dose.   Past Medical History    Diagnosis Date  . Hypothyroidism   . Back pain     s/p back surgery  . Migraines   . Tobacco dependence   . PLANTAR WART, RIGHT 10/09/2009    Past Surgical History  Procedure Date  . Back surgery 2009    lower lumbar fusion- rubber disc in back  . Nasal sinus surgery 2007    Family History  Problem Relation Age of Onset  . Emphysema Mother     smoker  . Obesity Father     History   Social History  . Marital Status: Married    Spouse Name: N/A    Number of Children: N/A  . Years of Education: N/A   Occupational History  . Not on file.   Social History Main Topics  . Smoking status: Current Everyday Smoker -- 1.5 packs/day for 27 years    Types: Cigarettes  . Smokeless tobacco: Never Used  . Alcohol Use: Yes     rarely  . Drug Use: No  . Sexually Active: No   Other Topics Concern  . 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  Rare ETOH, no drugs.No exercise.    Outpatient Encounter Prescriptions as of 11/20/2010  Medication Sig Dispense Refill  . diazepam (VALIUM)  5 MG tablet Take 5 mg by mouth at bedtime as needed.        Marland Kitchen HYDROcodone-acetaminophen (NORCO) 5-325 MG per tablet Take 1 tablet by mouth every 6 (six) hours as needed.        Marland Kitchen levothyroxine (SYNTHROID, LEVOTHROID) 100 MCG tablet Take 100 mcg by mouth daily.        Marland Kitchen azithromycin (ZITHROMAX) 250 MG tablet 2 tabs po qd x 1d, then 1 tab po qd x 4d  6 each  0  . predniSONE (DELTASONE) 20 MG tablet 3 tabs po qd x 5d, then 2 tabs po qd x 5d  25 tablet  0  . rizatriptan (MAXALT) 10 MG tablet Take 1 tablet (10 mg total) by mouth once as needed for migraine. May repeat in 2 hours if needed  12 tablet  1  . varenicline (CHANTIX CONTINUING MONTH PAK) 1 MG tablet Take 1 tablet (1 mg total) by mouth 2 (two) times daily.  60 tablet  1  . varenicline (CHANTIX STARTING  MONTH PAK) 0.5 MG X 11 & 1 MG X 42 tablet Take one 0.5mg  tablet by mouth once daily for 3 days, then increase to one 0.5mg  tablet twice daily for 3 days, then increase to one 1mg  tablet twice daily.  53 tablet  0  . DISCONTD: cyclobenzaprine (FLEXERIL) 10 MG tablet Take 10 mg by mouth 3 (three) times daily as needed.          Allergies  Allergen Reactions  . Oxycodone-Acetaminophen     REACTION: itching    ROS Review of Systems  HENT: Negative for hearing loss, neck pain and tinnitus.   Respiratory:       No exertional CP  Cardiovascular: Negative for palpitations and leg swelling.  Gastrointestinal: Negative for nausea and abdominal pain.  Musculoskeletal: Positive for back pain (chronic mild; long term narcotic managment as per Dr. Lovell Sheehan (Neurosurgery)). Negative for joint swelling.  Neurological: Positive for headaches (as per HPI). Negative for dizziness, tremors, seizures and syncope.  Psychiatric/Behavioral: Negative for suicidal ideas, behavioral problems, confusion, dysphoric mood and agitation. The patient is not nervous/anxious.      PE; Blood pressure 131/88, pulse 71, temperature 98.1 F (36.7 C), temperature source Oral, height 5\' 5"  (1.651 m), weight 139 lb (63.05 kg), SpO2 99.00%. Gen: Alert, well appearing.  Patient is oriented to person, place, time, and situation. VS: noted--normal. Gen: alert, NAD, NONTOXIC APPEARING. HEENT: eyes without injection, drainage, or swelling.  Ears: EACs clear, TMs with normal light reflex and landmarks.  Nose: Clear rhinorrhea, with some dried, crusty exudate adherent to mildly injected mucosa.  No purulent d/c.  No paranasal sinus TTP.  No facial swelling.  Throat and mouth without focal lesion.  No pharyngial swelling, erythema, or exudate.   Neck: supple, no LAD.   LUNGS: CTA bilat, nonlabored resps.  Exp phase not prolonged. CV: RRR, no m/r/g. EXT: no c/c/e SKIN: no rash   Pertinent labs:  None today.  ASSESSMENT AND PLAN:     Transfer pt: need old records from Dr. Yehuda Budd and Dr. Morrison Old  Bronchitis due to tobacco use With sx's of superimposed infection.   Viral more likely etiology but smoking makes him higher risk of bacterial infection so will start empiric azithromycin x 5d. Prednisone 60mg  qd x 5d, then 40mg  qd x 5d, then stop. He will quit smoking --quit date 1 wk.  Tobacco dependence Discussed cessation plan in detail: quit date 1 wk after starting chantix -rxs for  start pack and maintenance dose given today. Therapeutic expectations and side effect profile of medication discussed today.  Patient's questions answered. F/u 2 wks.  HYPOTHYROIDISM Lab Results  Component Value Date   TSH 24.369* 10/09/2009   Last dose increase per pt was 11/08/10 (increased to 112 mcg qd), so will recheck TSH around 12/22/10 at the earliest.  Migraine headache without aura Discussed basic approach to tx: avoid triggers (none identified), find an abortive med that works and then start prophylactic med. Maxalt 10mg  rx'd today.  Therapeutic expectations and side effect profile of medication discussed today.  Patient's questions answered.    He is UTD on flu vaccine already (10/25/10 at his employer's).  Return in about 2 weeks (around 12/04/2010) for f/u bronchitis, tob cess, migraines.

## 2010-11-20 NOTE — Assessment & Plan Note (Signed)
With sx's of superimposed infection.   Viral more likely etiology but smoking makes him higher risk of bacterial infection so will start empiric azithromycin x 5d. Prednisone 60mg  qd x 5d, then 40mg  qd x 5d, then stop. He will quit smoking --quit date 1 wk.

## 2010-11-20 NOTE — Telephone Encounter (Signed)
Pls request records from Dr. Yehuda Budd at [facility] and from Dr. Morrison Old (Endocrinologist in [place]).  Thx--PM

## 2010-11-20 NOTE — Assessment & Plan Note (Signed)
Discussed basic approach to tx: avoid triggers (none identified), find an abortive med that works and then start prophylactic med. Maxalt 10mg  rx'd today.  Therapeutic expectations and side effect profile of medication discussed today.  Patient's questions answered.

## 2010-11-20 NOTE — Assessment & Plan Note (Signed)
Discussed cessation plan in detail: quit date 1 wk after starting chantix -rxs for start pack and maintenance dose given today. Therapeutic expectations and side effect profile of medication discussed today.  Patient's questions answered. F/u 2 wks.

## 2010-11-20 NOTE — Assessment & Plan Note (Signed)
Lab Results  Component Value Date   TSH 24.369* 10/09/2009   Last dose increase per pt was 11/08/10 (increased to 112 mcg qd), so will recheck TSH around 12/22/10 at the earliest.

## 2010-12-04 ENCOUNTER — Encounter: Payer: Self-pay | Admitting: Family Medicine

## 2010-12-04 ENCOUNTER — Ambulatory Visit (INDEPENDENT_AMBULATORY_CARE_PROVIDER_SITE_OTHER): Payer: 59 | Admitting: Family Medicine

## 2010-12-04 DIAGNOSIS — J410 Simple chronic bronchitis: Secondary | ICD-10-CM

## 2010-12-04 DIAGNOSIS — F17200 Nicotine dependence, unspecified, uncomplicated: Secondary | ICD-10-CM

## 2010-12-04 DIAGNOSIS — J40 Bronchitis, not specified as acute or chronic: Secondary | ICD-10-CM

## 2010-12-04 DIAGNOSIS — E039 Hypothyroidism, unspecified: Secondary | ICD-10-CM

## 2010-12-04 NOTE — Assessment & Plan Note (Addendum)
MUCH IMPROVED. Apparently he had a good amount of sinusitis that cleared up as well.

## 2010-12-04 NOTE — Progress Notes (Signed)
OFFICE NOTE  12/04/2010  CC:  Chief Complaint  Patient presents with  . Follow-up    2 week follow up on Bronchitis     HPI:   Patient is a 45 y.o. Caucasian male who is here for f/u sinusits/bronchitis and tobacco cessation. Says he feels much better since taking prednisone and zithromax.  Says a large amount of mucous and some bleeding came from nose and he felt much better after that.  Says he has not had a headache again since his sinusitis sx's resolved.  Says he had pharmacy/insurer problems with getting the maxalt filled, but did not have opportunity to use it anyway.  He's cut back to about 5 cigarettes a day since starting chantix 2 wks ago.  Denies side effect from this med. He does report having a few mouth ulcers (inside of cheeks and tip of tongue)and these started clearing up right after finishing his 5d of steroids, and he wonders if this could have been from one of his new meds recently.   Pertinent PMH:  Tobacco dependence Smoker's bronchitis Hypothyroidism  MEDS;   Outpatient Prescriptions Prior to Visit  Medication Sig Dispense Refill  . diazepam (VALIUM) 5 MG tablet Take 5 mg by mouth at bedtime as needed.        Marland Kitchen HYDROcodone-acetaminophen (NORCO) 5-325 MG per tablet Take 1 tablet by mouth every 6 (six) hours as needed.        Marland Kitchen levothyroxine (SYNTHROID, LEVOTHROID) 100 MCG tablet Take 100 mcg by mouth daily.        . varenicline (CHANTIX CONTINUING MONTH PAK) 1 MG tablet Take 1 tablet (1 mg total) by mouth 2 (two) times daily.  60 tablet  1  . rizatriptan (MAXALT) 10 MG tablet Take 1 tablet (10 mg total) by mouth once as needed for migraine. May repeat in 2 hours if needed  12 tablet  1  . predniSONE (DELTASONE) 20 MG tablet 3 tabs po qd x 5d, then 2 tabs po qd x 5d  25 tablet  0  . varenicline (CHANTIX STARTING MONTH PAK) 0.5 MG X 11 & 1 MG X 42 tablet Take one 0.5mg  tablet by mouth once daily for 3 days, then increase to one 0.5mg  tablet twice daily for 3  days, then increase to one 1mg  tablet twice daily.  53 tablet  0    PE: Blood pressure 113/71, pulse 65, temperature 98.1 F (36.7 C), temperature source Oral, height 5\' 5"  (1.651 m), weight 142 lb 1.9 oz (64.465 kg), SpO2 99.00%. Gen: Alert, well appearing.  Patient is oriented to person, place, time, and situation. HEENT:  Ears: EACs clear, normal epithelium.  TMs with good light reflex and landmarks bilaterally.  Eyes: no injection, icteris, swelling, or exudate.  EOMI, PERRLA. Nose: no drainage or turbinate edema/swelling.  No injection or focal lesion.  Mouth: lips without lesion/swelling.  Oral mucosa pink and moist.  Left buccal mucosa with a 2mm scabbed-over ulcer, otherwise no focal lesions.   Oropharynx without erythema, exudate, or swelling.  Neck: supple, ROM full.   No lymphadenopathy, thyromegaly, or mass. Chest: symmetric expansion, nonlabored respirations.  Clear and equal breath sounds in all lung fields.   CV: RRR, no m/r/g.   EXT: no clubbing, cyanosis, or edema.    IMPRESSION AND PLAN:  Bronchitis due to tobacco use MUCH IMPROVED. Apparently he had a good amount of sinusitis that cleared up as well.     Tobacco dependence He's doing pretty well with quitting/cutting  back. Encouraged him.  Will continue with Chantix--plan for 37mo of this.  Encouraged pt to use the 1800-QUITNOW line.   HYPOTHYROIDISM Needs TSH recheck on/after 12/22/10 (this will be about 6 wks after his most recent dose increase).    Headaches: sinusitis-related.  If he starts having any recurrence of episodic HA's with nausea and/or photophobia then we can reconsider a triptan.  FOLLOW UP:  Return in about 3 weeks (around 12/25/2010) for f/u hypothyroidism and tob cess.

## 2010-12-04 NOTE — Assessment & Plan Note (Signed)
Needs TSH recheck on/after 12/22/10 (this will be about 6 wks after his most recent dose increase).

## 2010-12-04 NOTE — Assessment & Plan Note (Signed)
He's doing pretty well with quitting/cutting back. Encouraged him.  Will continue with Chantix--plan for 45mo of this.  Encouraged pt to use the 1800-QUITNOW line.

## 2010-12-25 ENCOUNTER — Encounter: Payer: Self-pay | Admitting: Family Medicine

## 2010-12-25 ENCOUNTER — Ambulatory Visit (INDEPENDENT_AMBULATORY_CARE_PROVIDER_SITE_OTHER): Payer: 59 | Admitting: Family Medicine

## 2010-12-25 VITALS — BP 110/73 | HR 71 | Ht 65.0 in | Wt 143.0 lb

## 2010-12-25 DIAGNOSIS — F17200 Nicotine dependence, unspecified, uncomplicated: Secondary | ICD-10-CM

## 2010-12-25 DIAGNOSIS — G43009 Migraine without aura, not intractable, without status migrainosus: Secondary | ICD-10-CM

## 2010-12-25 DIAGNOSIS — E039 Hypothyroidism, unspecified: Secondary | ICD-10-CM

## 2010-12-25 NOTE — Assessment & Plan Note (Signed)
He has been HA-free since his relatively recent bout of sinusitis.   It seems that these were sinusitis induced/related, not true migraine HA's.

## 2010-12-25 NOTE — Progress Notes (Signed)
OFFICE NOTE  12/25/2010  CC:  Chief Complaint  Patient presents with  . Hypothyroidism  . Tobacco Cessation     HPI:   Patient is a 45 y.o. Caucasian male who is here for f/u hypothyroidism and tobacco cessation. He has been on chantix about 5 wks now and his last cigarette was 6 days ago!.  Doing well, feels great. Breathing is feeling better already. Continues to be completely without any headaches since his sinusitis resolved last visit.   He is due for TSH recheck today. No new complaints.  Pertinent PMH:  Hypothyroidism Tobacco dependence Anxiety  MEDS;  Currently on levothyroxine 112 mcg once daily, chantix 1mg  bid, Norco q6h prn, valium 5mg  qhs prn  Outpatient Prescriptions Prior to Visit  Medication Sig Dispense Refill  . diazepam (VALIUM) 5 MG tablet Take 5 mg by mouth at bedtime as needed.        Marland Kitchen HYDROcodone-acetaminophen (NORCO) 5-325 MG per tablet Take 1 tablet by mouth every 6 (six) hours as needed.        . varenicline (CHANTIX CONTINUING MONTH PAK) 1 MG tablet Take 1 tablet (1 mg total) by mouth 2 (two) times daily.  60 tablet  1  . rizatriptan (MAXALT) 10 MG tablet Take 1 tablet (10 mg total) by mouth once as needed for migraine. May repeat in 2 hours if needed  12 tablet  1  . levothyroxine (SYNTHROID, LEVOTHROID) 100 MCG tablet Take 100 mcg by mouth daily.          PE: Blood pressure 110/73, pulse 71, height 5\' 5"  (1.651 m), weight 143 lb (64.864 kg), SpO2 98.00%. Gen: Alert, well appearing.  Patient is oriented to person, place, time, and situation. CV: RRR, no m/r/g.   LUNGS: CTA bilat, nonlabored resps, good aeration in all lung fields. EXT: no clubbing, cyanosis, or edema.   LAB: none today  IMPRESSION AND PLAN:  Tobacco dependence Doing great: last cig was 6d/a! Encouraged pt.   Finish chantix 46mo course.  HYPOTHYROIDISM Feeling well. Recheck TSH today (it has been about 6+ wks since last dose increase from 100 to 112 mcg  daily).  Migraine headache without aura He has been HA-free since his relatively recent bout of sinusitis.   It seems that these were sinusitis induced/related, not true migraine HA's.     FOLLOW UP:  Return in about 6 months (around 06/24/2011).

## 2010-12-25 NOTE — Assessment & Plan Note (Signed)
Doing great: last cig was 6d/a! Encouraged pt.   Finish chantix 57mo course.

## 2010-12-25 NOTE — Assessment & Plan Note (Signed)
Feeling well. Recheck TSH today (it has been about 6+ wks since last dose increase from 100 to 112 mcg daily).

## 2011-03-20 ENCOUNTER — Other Ambulatory Visit: Payer: Self-pay | Admitting: Neurosurgery

## 2011-03-20 ENCOUNTER — Other Ambulatory Visit (HOSPITAL_COMMUNITY): Payer: Self-pay | Admitting: Neurosurgery

## 2011-03-20 DIAGNOSIS — M5416 Radiculopathy, lumbar region: Secondary | ICD-10-CM

## 2011-03-27 ENCOUNTER — Other Ambulatory Visit: Payer: Self-pay | Admitting: Neurosurgery

## 2011-04-09 ENCOUNTER — Ambulatory Visit (HOSPITAL_COMMUNITY)
Admission: RE | Admit: 2011-04-09 | Discharge: 2011-04-09 | Disposition: A | Discharge status: Home / Self Care | Payer: 59 | Source: Ambulatory Visit | Attending: Neurosurgery | Admitting: Neurosurgery

## 2011-04-09 DIAGNOSIS — G43909 Migraine, unspecified, not intractable, without status migrainosus: Secondary | ICD-10-CM | POA: Insufficient documentation

## 2011-04-09 DIAGNOSIS — F17200 Nicotine dependence, unspecified, uncomplicated: Secondary | ICD-10-CM | POA: Insufficient documentation

## 2011-04-09 DIAGNOSIS — M5416 Radiculopathy, lumbar region: Secondary | ICD-10-CM

## 2011-04-09 DIAGNOSIS — M545 Low back pain, unspecified: Secondary | ICD-10-CM | POA: Insufficient documentation

## 2011-04-09 DIAGNOSIS — M79609 Pain in unspecified limb: Secondary | ICD-10-CM | POA: Insufficient documentation

## 2011-04-09 DIAGNOSIS — M25559 Pain in unspecified hip: Secondary | ICD-10-CM | POA: Insufficient documentation

## 2011-04-09 DIAGNOSIS — E039 Hypothyroidism, unspecified: Secondary | ICD-10-CM | POA: Insufficient documentation

## 2011-04-09 DIAGNOSIS — Z981 Arthrodesis status: Secondary | ICD-10-CM | POA: Insufficient documentation

## 2011-04-09 MED ORDER — IOHEXOL 180 MG/ML  SOLN
20.0000 mL | Freq: Once | INTRAMUSCULAR | Status: AC | PRN
  Administered 2011-04-09: 20 mL via INTRATHECAL

## 2011-04-09 MED ORDER — ONDANSETRON HCL 4 MG/2ML IJ SOLN: 4.0000 mg | Freq: Four times a day (QID) | INTRAMUSCULAR | Status: DC | PRN

## 2011-04-09 MED ORDER — HYDROMORPHONE HCL 4 MG PO TABS
4.0000 mg | ORAL_TABLET | ORAL | Status: DC | PRN
  Administered 2011-04-09: 4 mg via ORAL

## 2011-04-09 MED ORDER — DIAZEPAM 5 MG PO TABS
ORAL_TABLET | ORAL | Status: AC
  Filled 2011-04-09: qty 2

## 2011-04-09 MED ORDER — DIAZEPAM 5 MG PO TABS: 10.0000 mg | ORAL_TABLET | Freq: Once | ORAL | Status: DC

## 2011-04-09 MED ORDER — MORPHINE SULFATE 4 MG/ML IJ SOLN: 2.0000 mg | INTRAMUSCULAR | Status: DC | PRN

## 2011-04-09 NOTE — Op Note (Signed)
Brief history: The patient complains of back pain.  Procedure: Lumbar myelogram  Surgeon: Dr. Jeff Nickalos Petersen  Asst.: None  Level injected:L5/S1  Dye injected: 15 cc of Omnipaque 180  CSF appearance: Clear  Estimated blood loss minimal  Complications: None    

## 2011-04-09 NOTE — H&P (Signed)
Subjective: The patient is a 46 year old white male who I performed an L4-5-1 level in several years ago. The patient has had recurrent back and leg pain. I discussed working up further with a lumbar mild CT. Patient was to proceed with that study.   Past Medical History  Diagnosis Date  . Hypothyroidism   . Back pain     s/p back surgery  . Migraines   . Tobacco dependence   . PLANTAR WART, RIGHT 10/09/2009    Past Surgical History  Procedure Date  . Back surgery 2009    lower lumbar fusion- rubber disc in back  . Nasal sinus surgery 2007    Allergies  Allergen Reactions  . Oxycodone-Acetaminophen     REACTION: itching    History  Substance Use Topics  . Smoking status: Former Smoker -- 0.5 packs/day for 27 years    Types: Cigarettes    Quit date: 12/19/2010  . Smokeless tobacco: Never Used  . Alcohol Use: Yes     rarely    Family History  Problem Relation Age of Onset  . Emphysema Mother     smoker  . Obesity Father    Prior to Admission medications   Medication Sig Start Date End Date Taking? Authorizing Provider  diazepam (VALIUM) 5 MG tablet Take 5 mg by mouth at bedtime as needed.     Yes Historical Provider, MD  HYDROcodone-acetaminophen (NORCO) 5-325 MG per tablet Take 1 tablet by mouth every 6 (six) hours as needed.     Yes Historical Provider, MD  levothyroxine (SYNTHROID) 112 MCG tablet Take 112 mcg by mouth daily. Allergy to generic.  Brand only.    Yes Historical Provider, MD     Review of Systems  Positive ROS: As above  All other systems have been reviewed and were otherwise negative with the exception of those mentioned in the HPI and as above.  Objective: Vital signs in last 24 hours: Temp:  [96.7 F (35.9 C)] 96.7 F (35.9 C) (02/26 0630) Pulse Rate:  [64] 64  (02/26 0630) Resp:  [18] 18  (02/26 0630) BP: (142)/(84) 142/84 mmHg (02/26 0630) SpO2:  [99 %] 99 % (02/26 0630) Weight:  [66.679 kg (147 lb)] 66.679 kg (147 lb) (02/26  0630)  General Appearance: Alert, cooperative, no distress, appears stated age Head: Normocephalic, without obvious abnormality, atraumatic Eyes: PERRL, conjunctiva/corneas clear, EOM's intact, fundi benign, both eyes      Ears: Normal TM's and external ear canals, both ears Throat: Lips, mucosa, and tongue normal; teeth and gums normal Neck: Supple, symmetrical, trachea midline, no adenopathy; thyroid: No enlargement/tenderness/nodules; no carotid bruit or JVD Back: Symmetric, no curvature, ROM normal, no CVA tenderness Lungs: Clear to auscultation bilaterally, respirations unlabored Heart: Regular rate and rhythm, S1 and S2 normal, no murmur, rub or gallop Abdomen: Soft, non-tender, bowel sounds active all four quadrants, no masses, no organomegaly Extremities: Extremities normal, atraumatic, no cyanosis or edema Pulses: 2+ and symmetric all extremities Skin: Skin color, texture, turgor normal, no rashes or lesions  NEUROLOGIC:   Mental status: alert and oriented, no aphasia, good attention span, Fund of knowledge/ memory ok Motor Exam - grossly normal Sensory Exam - grossly normal Reflexes:  Coordination - grossly normal Gait - grossly normal Balance - grossly normal Cranial Nerves: I: smell Not tested  II: visual acuity  OS: Normal    OD: Normal   II: visual fields Full to confrontation  II: pupils Equal, round, reactive to light  III,VII: ptosis  None  III,IV,VI: extraocular muscles  Full ROM  V: mastication Normal  V: facial light touch sensation  Normal  V,VII: corneal reflex  Present  VII: facial muscle function - upper  Normal  VII: facial muscle function - lower Normal  VIII: hearing Not tested  IX: soft palate elevation  Normal  IX,X: gag reflex Present  XI: trapezius strength  5/5  XI: sternocleidomastoid strength 5/5  XI: neck flexion strength  5/5  XII: tongue strength  Normal    Data Review Lab Results  Component Value Date   WBC 9.0 10/09/2009   HGB  14.4 10/09/2009   HCT 41.2 10/09/2009   MCV 90.2 10/09/2009   PLT 293 10/09/2009   Lab Results  Component Value Date   NA 137 10/09/2009   K 4.5 10/09/2009   CL 99 10/09/2009   CO2 30 10/09/2009   BUN 3* 10/09/2009   CREATININE 0.80 10/09/2009   GLUCOSE 80 10/09/2009   No results found for this basename: INR, PROTIME    Assessment/Plan: Lumbago, lumbar radiculopathy: As above I discussed a lumbar mild CT would patient want to proceed with that study.   Elyce Zollinger D 04/09/2011 8:01 AM

## 2011-04-10 ENCOUNTER — Telehealth (HOSPITAL_COMMUNITY): Payer: Self-pay

## 2011-06-11 ENCOUNTER — Other Ambulatory Visit: Payer: Self-pay | Admitting: Family Medicine

## 2011-06-11 MED ORDER — LEVOTHYROXINE SODIUM 112 MCG PO TABS: 112.0000 ug | ORAL_TABLET | Freq: Every day | ORAL | Status: DC

## 2011-06-11 NOTE — Telephone Encounter (Signed)
RX sent

## 2011-06-24 ENCOUNTER — Encounter: Payer: Self-pay | Admitting: Family Medicine

## 2011-06-24 ENCOUNTER — Ambulatory Visit (INDEPENDENT_AMBULATORY_CARE_PROVIDER_SITE_OTHER): Payer: 59 | Admitting: Family Medicine

## 2011-06-24 VITALS — BP 130/87 | HR 72 | Ht 65.0 in | Wt 149.0 lb

## 2011-06-24 DIAGNOSIS — E039 Hypothyroidism, unspecified: Secondary | ICD-10-CM

## 2011-06-24 DIAGNOSIS — Z8739 Personal history of other diseases of the musculoskeletal system and connective tissue: Secondary | ICD-10-CM

## 2011-06-24 DIAGNOSIS — R5381 Other malaise: Secondary | ICD-10-CM

## 2011-06-24 DIAGNOSIS — R5383 Other fatigue: Secondary | ICD-10-CM

## 2011-06-24 DIAGNOSIS — F17200 Nicotine dependence, unspecified, uncomplicated: Secondary | ICD-10-CM

## 2011-06-24 HISTORY — DX: Personal history of other diseases of the musculoskeletal system and connective tissue: Z87.39

## 2011-06-24 NOTE — Progress Notes (Signed)
OFFICE VISIT  06/24/2011   CC:  Chief Complaint  Patient presents with  . Follow-up    migraines-better; also still has no energy; seeing pain management-has had nerve block for leg     HPI:    Patient is a 46 y.o. Caucasian male who presents for f/u hypothyroidism, chronic pain.   Unfortunately, he has begun smoking again some: "a few on the weekend only".  Got injection--nerve block--and started neurontin recently and feels like his back and leg pain is improved. Now takes avg of 1 vicodin per day. Says energy level still poor, no interest in sex with wife, has a bit of anhedonia.  No crying spells, no hx of panic/severe anxiety.  He used to think it was related to his back pain problem, but since this is better he hasn't noticed any difference in energy. No snoring with sleep.  No witnessed apneic events in sleep. Typically gets 6 hours of sleep per night--no recent change in this.  No relation to any specific medication--was feeling this prior to starting the neurontin. He has been on vicodin since 2008 for chronic back pain. Last TSH:  Lab Results  Component Value Date   TSH 1.463 12/25/2010   ROS: No GI c/o, no urinary c/o. Appetite is real good.  No new skin problems (chronic dryness). No HA's.      Past Medical History  Diagnosis Date  . Hypothyroidism   . Back pain     s/p back surgery  . Migraines   . Tobacco dependence   . PLANTAR WART, RIGHT 10/09/2009    Past Surgical History  Procedure Date  . Back surgery 2009    lower lumbar fusion- rubber disc in back  . Nasal sinus surgery 2007    MEDS: levothyroxine 112 mcg qd, cyclobenzaprine 10mg  q8h prn, vicodin q6h prn, neurontin 300mg  tid   Allergies  Allergen Reactions  . Oxycodone-Acetaminophen     REACTION: itching    ROS As per HPI  PE: Blood pressure 130/87, pulse 72, height 5\' 5"  (1.651 m), weight 149 lb (67.586 kg). Gen: Alert, well appearing.  Patient is oriented to person, place, time, and  situation. ENT: Ears: EACs clear, normal epithelium.  TMs with good light reflex and landmarks bilaterally.  Eyes: no injection, icteris, swelling, or exudate.  EOMI, PERRLA. Nose: no drainage or turbinate edema/swelling.  No injection or focal lesion.  Mouth: lips without lesion/swelling.  Oral mucosa pink and moist.  Dentition intact and without obvious caries or gingival swelling.  Oropharynx without erythema, exudate, or swelling.  Neck - No masses or thyromegaly or limitation in range of motion CV: RRR, no m/r/g.   LUNGS: CTA bilat, nonlabored resps, good aeration in all lung fields. ABD: soft, NT, ND, BS normal.  No hepatospenomegaly or mass.  No bruits. EXT: no clubbing, cyanosis, or edema.   LABS:  None today  IMPRESSION AND PLAN:  Fatigue Nonspecific anhedonia/dythymia, but will r/o hypogonadism as well as anemia and will check metabolic panel and TSH. He has no sx's to suggest OSA.  History of low back pain Improved s/p recent injection. Continue neurontin and prn vicodin as per pain mgmt md.  HYPOTHYROIDISM Routine TSH recheck ASAP (lab tech is out sick today)--lab visit in near future.  Tobacco dependence Pretty much complete cessation. I did warn him about the potential to pick back up to more of a "full time" smoker if he's not careful with his "weekend smoking" that he has begun doing again.  FOLLOW UP: Return for o/v with me in 70mo; lab visit in 3d (not fasting) here.

## 2011-06-24 NOTE — Assessment & Plan Note (Signed)
Routine TSH recheck ASAP (lab tech is out sick today)--lab visit in near future.

## 2011-06-24 NOTE — Assessment & Plan Note (Signed)
Nonspecific anhedonia/dythymia, but will r/o hypogonadism as well as anemia and will check metabolic panel and TSH. He has no sx's to suggest OSA.

## 2011-06-24 NOTE — Assessment & Plan Note (Signed)
Pretty much complete cessation. I did warn him about the potential to pick back up to more of a "full time" smoker if he's not careful with his "weekend smoking" that he has begun doing again.

## 2011-06-24 NOTE — Assessment & Plan Note (Signed)
Improved s/p recent injection. Continue neurontin and prn vicodin as per pain mgmt md.

## 2011-06-25 ENCOUNTER — Ambulatory Visit: Payer: 59 | Admitting: Family Medicine

## 2011-06-27 ENCOUNTER — Other Ambulatory Visit: Payer: 59

## 2011-06-27 DIAGNOSIS — R5383 Other fatigue: Secondary | ICD-10-CM

## 2011-06-27 LAB — COMPREHENSIVE METABOLIC PANEL
ALT: 14 U/L (ref 0–53)
AST: 15 U/L (ref 0–37)
Albumin: 4.3 g/dL (ref 3.5–5.2)
Alkaline Phosphatase: 103 U/L (ref 39–117)
BUN: 9 mg/dL (ref 6–23)
CO2: 26 mEq/L (ref 19–32)
Calcium: 9.4 mg/dL (ref 8.4–10.5)
Chloride: 99 mEq/L (ref 96–112)
Creat: 0.71 mg/dL (ref 0.50–1.35)
Glucose, Bld: 91 mg/dL (ref 70–99)
Potassium: 3.8 mEq/L (ref 3.5–5.3)
Sodium: 135 mEq/L (ref 135–145)
Total Bilirubin: 0.5 mg/dL (ref 0.3–1.2)
Total Protein: 6.7 g/dL (ref 6.0–8.3)

## 2011-06-27 LAB — CBC WITH DIFFERENTIAL/PLATELET
Basophils Absolute: 0 10*3/uL (ref 0.0–0.1)
Basophils Relative: 0 % (ref 0–1)
Eosinophils Absolute: 0.3 10*3/uL (ref 0.0–0.7)
Eosinophils Relative: 3 % (ref 0–5)
HCT: 40.1 % (ref 39.0–52.0)
Hemoglobin: 13.8 g/dL (ref 13.0–17.0)
Lymphocytes Relative: 37 % (ref 12–46)
Lymphs Abs: 4.1 10*3/uL — ABNORMAL HIGH (ref 0.7–4.0)
MCH: 31 pg (ref 26.0–34.0)
MCHC: 34.4 g/dL (ref 30.0–36.0)
MCV: 90.1 fL (ref 78.0–100.0)
Monocytes Absolute: 0.5 10*3/uL (ref 0.1–1.0)
Monocytes Relative: 4 % (ref 3–12)
Neutro Abs: 6.3 10*3/uL (ref 1.7–7.7)
Neutrophils Relative %: 56 % (ref 43–77)
Platelets: 301 10*3/uL (ref 150–400)
RBC: 4.45 MIL/uL (ref 4.22–5.81)
RDW: 13.7 % (ref 11.5–15.5)
WBC: 11.2 10*3/uL — ABNORMAL HIGH (ref 4.0–10.5)

## 2011-06-27 LAB — TSH: TSH: 1.836 u[IU]/mL (ref 0.350–4.500)

## 2011-06-28 LAB — TESTOSTERONE: Testosterone: 362.11 ng/dL (ref 300–890)

## 2011-07-04 ENCOUNTER — Ambulatory Visit (INDEPENDENT_AMBULATORY_CARE_PROVIDER_SITE_OTHER): Payer: 59 | Admitting: Family Medicine

## 2011-07-04 ENCOUNTER — Encounter: Payer: Self-pay | Admitting: Family Medicine

## 2011-07-04 VITALS — BP 128/84 | HR 65 | Ht 65.0 in | Wt 147.0 lb

## 2011-07-04 DIAGNOSIS — F411 Generalized anxiety disorder: Secondary | ICD-10-CM

## 2011-07-04 MED ORDER — CITALOPRAM HYDROBROMIDE 20 MG PO TABS: 20.0000 mg | ORAL_TABLET | Freq: Every day | ORAL | Status: DC

## 2011-07-04 NOTE — Progress Notes (Signed)
OFFICE NOTE  07/04/2011  CC:  Chief Complaint  Patient presents with  . Depression     HPI: Patient is a 46 y.o. Caucasian male who is here for discussion of recent symptoms of decreased interest in sex and decreased energy.  His exam and all lab work at recent o/v was normal. He reiterates that his problem is primarily decreased interest in sex with his wife, but once she initiates sex he can proceed and perform fine--no erectile dysfunction.  He has noted this as a problem for about a year, ever since his mother passed away and he has been distracted with more responsibility and more financial concerns than his usual.  He worries excessively about "everything", "even things I shouldn't be worrying about".  He says this was worse when he was dealing with his LBP but this has recently been alleviated quite a bit since getting a back injection and also getting on neurontin.   Denies a feeling of sadness or depression, denies isolation or crying spells, denies sleep problems, denies, guilt, denies suicidal thoughts or psychomotor slowing.  No hx of hypomania or mania, no hx of depression or anxiety treatment in the past.  He admits he has some periods of sadness that briefly hit him when he thinks about his mom's death last year, but nothing that lasts more than a few minutes. No panic sx's.    Pertinent PMH:  Past Medical History  Diagnosis Date  . Hypothyroidism   . Back pain     s/p back surgery  . Migraines   . Tobacco dependence   . PLANTAR WART, RIGHT 10/09/2009   Past surgical, social, and family history reviewed and no changes noted since last office visit.  MEDS:  Outpatient Prescriptions Prior to Visit  Medication Sig Dispense Refill  . cyclobenzaprine (FLEXERIL) 10 MG tablet Take 1 tablet by mouth Three times daily as needed.      . gabapentin (NEURONTIN) 300 MG capsule Take 1 capsule by mouth Three times a day.      Marland Kitchen HYDROcodone-acetaminophen (NORCO) 5-325 MG per tablet  Take 1 tablet by mouth every 6 (six) hours as needed.        Marland Kitchen levothyroxine (SYNTHROID) 112 MCG tablet Take 1 tablet (112 mcg total) by mouth daily. Allergy to generic.  Brand only.  30 tablet  1    PE: Blood pressure 128/84, pulse 65, height 5\' 5"  (1.651 m), weight 147 lb (66.679 kg). Gen: Alert, well appearing.  Patient is oriented to person, place, time, and situation. No further exam today.  IMPRESSION AND PLAN:  GAD (generalized anxiety disorder) Start trial of citalopram 20mg  qd.  Therapeutic expectations and side effect profile of medication discussed today.  Patient's questions answered. F/u in office in 1 mo.  Call or return for any problems prior to this.    FOLLOW UP: 48mo

## 2011-07-04 NOTE — Assessment & Plan Note (Signed)
Start trial of citalopram 20mg  qd.  Therapeutic expectations and side effect profile of medication discussed today.  Patient's questions answered. F/u in office in 1 mo.  Call or return for any problems prior to this.

## 2011-08-05 ENCOUNTER — Ambulatory Visit (INDEPENDENT_AMBULATORY_CARE_PROVIDER_SITE_OTHER): Payer: 59 | Admitting: Family Medicine

## 2011-08-05 ENCOUNTER — Encounter: Payer: Self-pay | Admitting: Family Medicine

## 2011-08-05 VITALS — BP 130/83 | HR 79 | Ht 65.0 in | Wt 146.0 lb

## 2011-08-05 DIAGNOSIS — M6740 Ganglion, unspecified site: Secondary | ICD-10-CM | POA: Insufficient documentation

## 2011-08-05 DIAGNOSIS — F411 Generalized anxiety disorder: Secondary | ICD-10-CM

## 2011-08-05 MED ORDER — CITALOPRAM HYDROBROMIDE 20 MG PO TABS: 20.0000 mg | ORAL_TABLET | Freq: Every day | ORAL | Status: DC

## 2011-08-05 NOTE — Assessment & Plan Note (Signed)
Reassured pt that as long as it isn't bothering him or enlarging rapidly then he can leave it alone and not worry about it. He says he'll leave it alone.

## 2011-08-05 NOTE — Assessment & Plan Note (Signed)
Improving appropriately on citalopram x 34mo. Cont. 20mg  qd dosing, f/u in 2 mo to reassess for complete remission.

## 2011-08-05 NOTE — Progress Notes (Signed)
OFFICE NOTE  08/05/2011  CC:  Chief Complaint  Patient presents with  . Follow-up    anxiety     HPI: Patient is a 46 y.o. Caucasian male who is here for GAD, started citalopram about 1 month ago. He says he has recently started to feel improvement in overall sense of well being, calmer, sleep is better.  Tolerating med w/out any side effect (initially had mild sedation but this wore off after 3-4 doses).  Asks me to look at a bump that has been on his wrist for years and has not changes size or bothered him in any way.     Pertinent PMH:  Past Medical History  Diagnosis Date  . Hypothyroidism   . Back pain     s/p back surgery  . Migraines   . Tobacco dependence   . PLANTAR WART, RIGHT 10/09/2009    MEDS:  Outpatient Prescriptions Prior to Visit  Medication Sig Dispense Refill  . citalopram (CELEXA) 20 MG tablet Take 1 tablet (20 mg total) by mouth daily.  30 tablet  1  . cyclobenzaprine (FLEXERIL) 10 MG tablet Take 1 tablet by mouth Three times daily as needed.      . gabapentin (NEURONTIN) 300 MG capsule Take 1 capsule by mouth Three times a day.      . levothyroxine (SYNTHROID) 112 MCG tablet Take 1 tablet (112 mcg total) by mouth daily. Allergy to generic.  Brand only.  30 tablet  1  . HYDROcodone-acetaminophen (NORCO) 5-325 MG per tablet Take 1 tablet by mouth every 6 (six) hours as needed.          PE: Blood pressure 130/83, pulse 79, height 5\' 5"  (1.651 m), weight 146 lb (66.225 kg). Wt Readings from Last 2 Encounters:  08/05/11 146 lb (66.225 kg)  07/04/11 147 lb (66.679 kg)  Gen: alert, oriented x 4, affect pleasant.  Lucid thinking and conversation noted. HEENT: PERRLA, EOMI.   Neck: no LAD, mass, or thyromegaly. CV: RRR, no m/r/g LUNGS: CTA bilat, nonlabored. NEURO: no tremor or tics noted on observation.  Coordination intact. CN 2-12 grossly intact bilaterally, strength 5/5 in all extremeties.  No ataxia. Left wrist volar surface with marble sized soft  cystic lesion palpable just superficial to the radial artery.  It is nontender and it can be palpated separate from the radial artery pulse.  Wrist ROM fully intact without pain.  IMPRESSION AND PLAN:  GAD (generalized anxiety disorder) Improving appropriately on citalopram x 447mo. Cont. 20mg  qd dosing, f/u in 2 mo to reassess for complete remission.  Ganglion cyst of flexor tendon sheath Reassured pt that as long as it isn't bothering him or enlarging rapidly then he can leave it alone and not worry about it. He says he'll leave it alone.     FOLLOW UP: 47mo

## 2011-08-09 ENCOUNTER — Other Ambulatory Visit: Payer: Self-pay | Admitting: *Deleted

## 2011-08-09 MED ORDER — LEVOTHYROXINE SODIUM 112 MCG PO TABS: 112.0000 ug | ORAL_TABLET | Freq: Every day | ORAL | Status: DC

## 2011-10-04 ENCOUNTER — Ambulatory Visit (INDEPENDENT_AMBULATORY_CARE_PROVIDER_SITE_OTHER): Payer: 59 | Admitting: Family Medicine

## 2011-10-04 ENCOUNTER — Encounter: Payer: Self-pay | Admitting: Family Medicine

## 2011-10-04 VITALS — BP 128/82 | HR 85 | Ht 65.0 in | Wt 153.0 lb

## 2011-10-04 DIAGNOSIS — F411 Generalized anxiety disorder: Secondary | ICD-10-CM

## 2011-10-04 MED ORDER — CITALOPRAM HYDROBROMIDE 20 MG PO TABS: 20.0000 mg | ORAL_TABLET | Freq: Every day | ORAL | Status: DC

## 2011-10-04 NOTE — Assessment & Plan Note (Signed)
Problem stable.  Continue current medications and diet appropriate for this condition.  We have reviewed our general long term plan for this problem and also reviewed symptoms and signs that should prompt the patient to call or return to the office. RF'd citalopram 20mg , #30, with RF x 6 today.

## 2011-10-04 NOTE — Progress Notes (Signed)
OFFICE NOTE  10/04/2011  CC:  Chief Complaint  Patient presents with  . Follow-up    anxiety, doing OK on citalopram     HPI: Patient is a 46 y.o. Caucasian male who is here for 2 mo f/u for anxiety.  He has been on 20mg  citalopram for 3 months now.  He is doing well.  He has hx of right sciatica so he got an epidural injection 2d/a and so far he feels like this has helped him a lot. His citalopram rx ran out 4d/a.  He says he feels like his anxiety is doing well on citalopram 20mg  qd.  Mood stable.  No side effects. He is holding off on anymore neurontin and hydrocodone for now (neither of these helped his leg pain any).  Pertinent PMH:  Past Medical History  Diagnosis Date  . Hypothyroidism   . Back pain     s/p back surgery  . Migraines   . Tobacco dependence   . PLANTAR WART, RIGHT 10/09/2009    MEDS:  Outpatient Prescriptions Prior to Visit  Medication Sig Dispense Refill  . citalopram (CELEXA) 20 MG tablet Take 1 tablet (20 mg total) by mouth daily.  30 tablet  5  . cyclobenzaprine (FLEXERIL) 10 MG tablet Take 1 tablet by mouth Three times daily as needed.      . gabapentin (NEURONTIN) 300 MG capsule Take 1 capsule by mouth Three times a day.      Marland Kitchen HYDROcodone-acetaminophen (NORCO) 5-325 MG per tablet Take 1 tablet by mouth every 6 (six) hours as needed.        Marland Kitchen levothyroxine (SYNTHROID) 112 MCG tablet Take 1 tablet (112 mcg total) by mouth daily. Allergy to generic.  Brand only.  30 tablet  5    PE: Blood pressure 128/82, pulse 85, height 5\' 5"  (1.651 m), weight 153 lb (69.4 kg). Gen: Alert, well appearing.  Patient is oriented to person, place, time, and situation. AFFECT: pleasant, lucid thought and speech. CV: RRR, no m/r/g.   LUNGS: CTA bilat, nonlabored resps, good aeration in all lung fields. EXT: no clubbing, cyanosis, or edema.   IMPRESSION AND PLAN:  GAD (generalized anxiety disorder) Problem stable.  Continue current medications and diet appropriate  for this condition.  We have reviewed our general long term plan for this problem and also reviewed symptoms and signs that should prompt the patient to call or return to the office. RF'd citalopram 20mg , #30, with RF x 6 today.     FOLLOW UP: 6 mo

## 2011-12-16 ENCOUNTER — Encounter: Payer: Self-pay | Admitting: *Deleted

## 2011-12-16 ENCOUNTER — Ambulatory Visit (INDEPENDENT_AMBULATORY_CARE_PROVIDER_SITE_OTHER): Payer: 59 | Admitting: Family Medicine

## 2011-12-16 ENCOUNTER — Encounter: Payer: Self-pay | Admitting: Family Medicine

## 2011-12-16 VITALS — BP 144/87 | HR 78 | Temp 99.2°F | Ht 65.0 in | Wt 152.0 lb

## 2011-12-16 DIAGNOSIS — J209 Acute bronchitis, unspecified: Secondary | ICD-10-CM

## 2011-12-16 MED ORDER — PREDNISONE 20 MG PO TABS: ORAL_TABLET | ORAL | Status: DC

## 2011-12-16 MED ORDER — AZITHROMYCIN 250 MG PO TABS: ORAL_TABLET | ORAL | Status: DC

## 2011-12-16 MED ORDER — ALBUTEROL SULFATE HFA 108 (90 BASE) MCG/ACT IN AERS: 2.0000 | INHALATION_SPRAY | Freq: Four times a day (QID) | RESPIRATORY_TRACT | Status: DC | PRN

## 2011-12-16 NOTE — Patient Instructions (Addendum)
Buy Mucinex DM over the counter for cough OR robitussin DM for cough.  Take as directed on packaging.

## 2011-12-16 NOTE — Assessment & Plan Note (Signed)
Mild RAD component. Prednisone 40mg  qdx 5d, then 20mg  qd x 5d. Ventolin HFA, 1-2 puffs q4h prn.  My CMA did inhaler teaching/education for his in exam room today. He is a smoker and has higher risk of bacterial infection as the etiology for this, so I did rx a Z-pack today. Recommended mucinex DM or robitussin DM prn coughing.

## 2011-12-16 NOTE — Progress Notes (Signed)
OFFICE NOTE  12/16/2011  CC:  Chief Complaint  Patient presents with  . URI    congestion, fever/chills since Sat. night     HPI: Patient is a 46 y.o. Caucasian male who is here for respiratory complaints. Pt presents complaining of respiratory symptoms for 2 days.  Primary symptoms are: fever and cough, pain in chest with coughing, chest tightness.  Worst symptoms seems to be the cough and aching all over.  Lately the symptoms seem to be worsening. Pertinent negatives:   No runny nose.  No pain in face or teeth.  No significant HA.  ST mild at most.   Symptoms made worse by activity, cold air.  Symptoms improved by rest. Smoker? yes Recent sick contact? None known Muscle or joint aches? Yes--diffuse Flu shot this season at least 2 wks ago? Yes (around 10/13/11).  Additional ROS: no n/v/d or abdominal pain.  No rash.  No neck stiffness.   +Mild fatigue.  +Mild appetite loss.   Pertinent PMH:  Past Medical History  Diagnosis Date  . Hypothyroidism   . Back pain     s/p back surgery  . Migraines   . Tobacco dependence   . PLANTAR WART, RIGHT 10/09/2009    MEDS:  Outpatient Prescriptions Prior to Visit  Medication Sig Dispense Refill  . citalopram (CELEXA) 20 MG tablet Take 1 tablet (20 mg total) by mouth daily.  30 tablet  6  . cyclobenzaprine (FLEXERIL) 10 MG tablet Take 1 tablet by mouth Three times daily as needed.      . gabapentin (NEURONTIN) 300 MG capsule Take 1 capsule by mouth Three times a day.      Marland Kitchen HYDROcodone-acetaminophen (NORCO) 5-325 MG per tablet Take 1 tablet by mouth every 6 (six) hours as needed.        Marland Kitchen levothyroxine (SYNTHROID) 112 MCG tablet Take 1 tablet (112 mcg total) by mouth daily. Allergy to generic.  Brand only.  30 tablet  5   Last reviewed on 12/16/2011 11:00 AM by Jeoffrey Massed, MD  PE: VS: noted--normal. Gen: alert, NAD, NONTOXIC APPEARING. HEENT: eyes without injection, drainage, or swelling.  Ears: EACs clear, TMs with normal light  reflex and landmarks.  Nose: Mild crusty exudate adherent to mildly injected mucosa.  No purulent d/c.  No paranasal sinus TTP.  No facial swelling.  Throat and mouth without focal lesion.  No pharyngial swelling, erythema, or exudate.   Neck: supple, no LAD.   LUNGS: CTA bilat, nonlabored resps.  With forced expiratory maneuver I could hear a few end expiratory, coarse wheezes. CV: RRR, no m/r/g. EXT: no c/c/e SKIN: no rash  LAB: none today  IMPRESSION AND PLAN:  Acute bronchitis Mild RAD component. Prednisone 40mg  qdx 5d, then 20mg  qd x 5d. Ventolin HFA, 1-2 puffs q4h prn.  My CMA did inhaler teaching/education for his in exam room today. He is a smoker and has higher risk of bacterial infection as the etiology for this, so I did rx a Z-pack today. Recommended mucinex DM or robitussin DM prn coughing.   An After Visit Summary was printed and given to the patient.  FOLLOW UP: prn

## 2011-12-25 ENCOUNTER — Ambulatory Visit: Payer: 59 | Admitting: Family Medicine

## 2012-02-03 ENCOUNTER — Other Ambulatory Visit: Payer: Self-pay | Admitting: Family Medicine

## 2012-04-02 ENCOUNTER — Ambulatory Visit: Payer: 59 | Admitting: Family Medicine

## 2012-05-13 ENCOUNTER — Encounter: Payer: Self-pay | Admitting: Family Medicine

## 2012-05-13 ENCOUNTER — Ambulatory Visit (INDEPENDENT_AMBULATORY_CARE_PROVIDER_SITE_OTHER): Payer: 59 | Admitting: Family Medicine

## 2012-05-13 VITALS — BP 120/80 | HR 72 | Ht 65.0 in | Wt 149.0 lb

## 2012-05-13 DIAGNOSIS — E039 Hypothyroidism, unspecified: Secondary | ICD-10-CM

## 2012-05-13 DIAGNOSIS — F17200 Nicotine dependence, unspecified, uncomplicated: Secondary | ICD-10-CM

## 2012-05-13 DIAGNOSIS — M4716 Other spondylosis with myelopathy, lumbar region: Secondary | ICD-10-CM

## 2012-05-13 DIAGNOSIS — Z8739 Personal history of other diseases of the musculoskeletal system and connective tissue: Secondary | ICD-10-CM

## 2012-05-13 NOTE — Assessment & Plan Note (Signed)
TSH today. He is feeling well and compliant with med.

## 2012-05-13 NOTE — Assessment & Plan Note (Signed)
Hx of L4-5 fusion, ongoing pain in LB + radiculopathy. I ordered referral to [facility] as per pt's request for 2nd opinion.

## 2012-05-13 NOTE — Assessment & Plan Note (Signed)
He is slowly cutting back. I encouraged complete cessation.

## 2012-05-13 NOTE — Progress Notes (Signed)
OFFICE NOTE  05/13/2012  CC:  Chief Complaint  Patient presents with  . Follow-up    thyroid     HPI: Patient is a 47 y.o. Caucasian male who is here for f/u hypothyroidism. Feeling well, no acute complaints.  Taking thyroid med daily.  He asks for a referral to [facility]) for second opinion about his low back pain with radiculopathy. He reports a hx of ongoing care with [facility] (Dr. Lovell Sheehan), plus another MD who has given epidural injections and treated him for neuropathic pain with neurontin (among other meds that the patient can't recall).  Pt says he feels like the meds (esp neurontin) made him feel "bad" and since he has stopped all meds except occasional ibuprofen or vicodin he feels better.  He is interested in seeing if another neurosurgeon group has anything different to recommend.  He continues to smoke but says he is slowly cutting back and hopes to completely quit by his birthday this year ([birth year]).  Pertinent PMH:  Past Medical History  Diagnosis Date  . Hypothyroidism   . Back pain     s/p back surgery  . Migraines   . Tobacco dependence   . PLANTAR WART, RIGHT 10/09/2009    MEDS:  Outpatient Prescriptions Prior to Visit  Medication Sig Dispense Refill  . ibuprofen (ADVIL,MOTRIN) 200 MG tablet Take 200 mg by mouth every 6 (six) hours as needed.      Marland Kitchen SYNTHROID 112 MCG tablet TAKE 1 TABLET BY MOUTH EVERY DAY  30 tablet  5  . HYDROcodone-acetaminophen (NORCO) 5-325 MG per tablet Take 1 tablet by mouth every 6 (six) hours as needed.        Marland Kitchen albuterol (VENTOLIN HFA) 108 (90 BASE) MCG/ACT inhaler Inhale 2 puffs into the lungs every 6 (six) hours as needed for wheezing.  1 Inhaler  0  . azithromycin (ZITHROMAX) 250 MG tablet 2 tabs po qd x 1d, then 1 tab po qd x 4d  6 each  0  . citalopram (CELEXA) 20 MG tablet Take 1 tablet (20 mg total) by mouth daily.  30 tablet  6  . cyclobenzaprine (FLEXERIL) 10 MG tablet Take 1 tablet by  mouth Three times daily as needed.      . gabapentin (NEURONTIN) 300 MG capsule Take 1 capsule by mouth Three times a day.      . predniSONE (DELTASONE) 20 MG tablet 2 tabs po qd x 5d, then 1 tab po qd x 5d  15 tablet  0   No facility-administered medications prior to visit.   Past Surgical History  Procedure Laterality Date  . Back surgery  2009    lower lumbar fusion- rubber disc in back  . Nasal sinus surgery  2007     PE: Blood pressure 120/80, pulse 72, height 5\' 5"  (1.651 m), weight 149 lb (67.586 kg). Gen: Alert, well appearing.  Patient is oriented to person, place, time, and situation. ENT:   Eyes: no injection, icteris, swelling, or exudate.  EOMI, PERRLA. Nose: no drainage or turbinate edema/swelling.  No injection or focal lesion.  Mouth: lips without lesion/swelling.  Oral mucosa pink and moist.  Dentition intact and without obvious caries or gingival swelling.  Oropharynx without erythema, exudate, or swelling.  Neck - No masses or thyromegaly or limitation in range of motion CV: RRR, no m/r/g.   LUNGS: CTA bilat, nonlabored resps, good aeration in all lung fields. EXT: no clubbing, cyanosis, or edema.  IMPRESSION AND PLAN:  HYPOTHYROIDISM TSH today. He is feeling well and compliant with med.  Tobacco dependence He is slowly cutting back. I encouraged complete cessation.  History of low back pain Hx of L4-5 fusion, ongoing pain in LB + radiculopathy. I ordered referral to [facility] as per pt's request for 2nd opinion.   An After Visit Summary was printed and given to the patient.  FOLLOW UP: 61mo

## 2012-08-17 ENCOUNTER — Other Ambulatory Visit: Payer: Self-pay | Admitting: Family Medicine

## 2012-08-17 MED ORDER — SYNTHROID 112 MCG PO TABS: ORAL_TABLET | ORAL | Status: DC

## 2012-08-18 ENCOUNTER — Encounter: Payer: Self-pay | Admitting: Family Medicine

## 2012-08-18 ENCOUNTER — Ambulatory Visit (INDEPENDENT_AMBULATORY_CARE_PROVIDER_SITE_OTHER): Payer: 59 | Admitting: Family Medicine

## 2012-08-18 VITALS — BP 132/86 | HR 108 | Temp 98.4°F | Resp 16 | Ht 65.0 in | Wt 139.0 lb

## 2012-08-18 DIAGNOSIS — R7309 Other abnormal glucose: Secondary | ICD-10-CM

## 2012-08-18 DIAGNOSIS — M79609 Pain in unspecified limb: Secondary | ICD-10-CM

## 2012-08-18 DIAGNOSIS — E119 Type 2 diabetes mellitus without complications: Secondary | ICD-10-CM

## 2012-08-18 DIAGNOSIS — B370 Candidal stomatitis: Secondary | ICD-10-CM

## 2012-08-18 DIAGNOSIS — E162 Hypoglycemia, unspecified: Secondary | ICD-10-CM

## 2012-08-18 DIAGNOSIS — M79604 Pain in right leg: Secondary | ICD-10-CM

## 2012-08-18 DIAGNOSIS — R739 Hyperglycemia, unspecified: Secondary | ICD-10-CM

## 2012-08-18 LAB — POCT URINALYSIS DIPSTICK
Bilirubin, UA: NEGATIVE
Leukocytes, UA: NEGATIVE
Nitrite, UA: NEGATIVE
Urobilinogen, UA: 0.2
pH, UA: 5.5

## 2012-08-18 LAB — CBC WITH DIFFERENTIAL/PLATELET
Basophils Absolute: 0.1 10*3/uL (ref 0.0–0.1)
Eosinophils Absolute: 0.1 10*3/uL (ref 0.0–0.7)
HCT: 46.1 % (ref 39.0–52.0)
Lymphs Abs: 2.9 10*3/uL (ref 0.7–4.0)
Monocytes Absolute: 0.7 10*3/uL (ref 0.1–1.0)
Monocytes Relative: 5.6 % (ref 3.0–12.0)
Platelets: 228 10*3/uL (ref 150.0–400.0)
RDW: 12.9 % (ref 11.5–14.6)

## 2012-08-18 LAB — GLUCOSE, POCT (MANUAL RESULT ENTRY): POC Glucose: 481 mg/dl — AB (ref 70–99)

## 2012-08-18 LAB — HEMOGLOBIN A1C: Hgb A1c MFr Bld: 10.4 % — ABNORMAL HIGH (ref 4.6–6.5)

## 2012-08-18 MED ORDER — NYSTATIN 100000 UNIT/ML MT SUSP: OROMUCOSAL | Status: DC

## 2012-08-18 MED ORDER — AMITRIPTYLINE HCL 25 MG PO TABS: ORAL_TABLET | ORAL | Status: DC

## 2012-08-18 MED ORDER — INSULIN PEN NEEDLE 32G X 6 MM MISC: 1.0000 | Freq: Four times a day (QID) | Status: DC | PRN

## 2012-08-18 NOTE — Progress Notes (Signed)
OFFICE NOTE  08/18/2012  CC:  Chief Complaint  Patient presents with  . Extremity Weakness  . Eye Problem  . Polydipsia  . Insomnia     HPI: Patient is a 47 y.o. Caucasian male who is here for multiple complaints. Since back surgery 2009, he has had on/off right leg numbness with mild aching pain, and since approx 10d ago it has been constantly aching/throbbing + some usual numbness and says it feels weak and he stumbles at times.  No injury or strenuous activity prior. Went to [facility] spine MD yest and had w/u for LB problem and everything was neg regarding imaging and he was told his leg problem shouldn't be coming from his back.  Multiple spinal steroid injections locally over the last few yrs have not helped leg sx's. Feels the sx's from top of thigh down to foot.  It is eased off briefly by changing position.  Not consistently brought on by walking.  No sx's in left leg.  Low back hurts some on and off--no recent changes.  Denies perineal numbness/saddle anesthesia and denies any prob w/control of bowel or bladder.  He has worsening of the sx's when trying to lie still sleeping, +urge to move both legs.  No problems getting an erection.  ROS: +polydipsia x 10d, polyuria as well.  Polyphagia.  Wt is down 10 lb compared to last wt here 3 mo ago.  +Blurry vision constant last 10d.  Fatigue.  No fevers.  Pertinent PMH:  Past Medical History  Diagnosis Date  . Hypothyroidism   . Back pain     s/p back surgery  . Migraines   . Tobacco dependence   . PLANTAR WART, RIGHT 10/09/2009   Past Surgical History  Procedure Laterality Date  . Back surgery  2009    lower lumbar fusion- rubber disc in back  . Nasal sinus surgery  2007   History   Social History Narrative   Married, 1 biologic child, 3 step children (all grown).   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   Smoker: 1-2 ppd x 17 yrs.  Rare ETOH, no drugs.   No exercise.             MEDS:   Outpatient Prescriptions Prior to Visit  Medication Sig Dispense Refill  . HYDROcodone-acetaminophen (NORCO) 10-325 MG per tablet Take 1 tablet by mouth 3 (three) times daily.      Marland Kitchen ibuprofen (ADVIL,MOTRIN) 200 MG tablet Take 200 mg by mouth every 6 (six) hours as needed.      Marland Kitchen SYNTHROID 112 MCG tablet TAKE 1 TABLET BY MOUTH EVERY DAY  30 tablet  5   No facility-administered medications prior to visit.    PE: Blood pressure 132/86, pulse 108, temperature 98.4 F (36.9 C), temperature source Temporal, resp. rate 16, height 5\' 5"  (1.651 m), weight 139 lb (63.05 kg), SpO2 99.00%. Gen: appears tired, mildly ill, but is alert and has lucid thought and speech.  Cries intermittently. ENT: no icterus.  Nose is clear.  Mouth shows a brownish-white film adherent to tongue.  Dentures are in.  Oral mucosa is pretty moist. Neck - No masses or thyromegaly or limitation in range of motion CV: Regular, tachy to 125-135, without m/r/g LUNGS: CTA bilat, nonlabored ABD: soft, NT, ND, BS normal.  No hepatospenomegaly or mass.  No bruits. EXT: no clubbing, cyanosis, or edema.  Neuro: CN 2-12 intact bilaterally, strength 5/5 in proximal and distal upper extremities.  Fine touch  sensation diminished in right leg diffusely.  Right leg strength 4-/5 prox and dist, with lower leg flexion a bit stronger than extension.  Left leg strength 5/5 prox and dist.    No tremor.  DTRs in LE's: right patellar is 1+, left patellar is 2+, achilles reflex 1+ bilat.  No clonus.  LAB: Random cap glucose today 481 CC UA today: Gluc >1000 mg/dl, Ket 40 mg/dl, protein trace, blood moderate  IMPRESSION AND PLAN:  Diabetes mellitus, new onset Classic symptoms + random glucose 481. Initially, will start him on insulin to get things controlled quicker and get him feeling better. Levemir 10 U qhs--first dose given in office today to demonstrate injection technique to pt--sample given. Novolog 5 units SQ qAC-sample given.   Check  HbA1c, CMET, CBC. Discussed basic dietary adjustments to make at this time but will get him to nutritionist soon for more detailed/expert education.  Right leg pain Suspect worsened neuropathic pain is resulting from his recent severe hyperglycemic state. Hopefully this will subside significantly as his glucoses normalize. Has failed adequate neurontin trial in the past.  Will start low dose amitriptyline: 25mg  qhs x 3d, then 25mg  bid.  Therapeutic expectations and side effect profile of medication discussed today.  Patient's questions answered.   Oral thrush Nystatin suspension rx'd today.  Therapeutic expectations and side effect profile of medication discussed today.  Patient's questions answered.    An After Visit Summary was printed and given to the patient.  Spent 50 min with pt today, with >50% of this time spent in counseling and care coordination regarding the above problems.   FOLLOW UP: 3d

## 2012-08-18 NOTE — Patient Instructions (Signed)
Take Levemir insulin 10 Units subQ once every night (skip tonight's dose b/c your first dose was given in office today). Take 5 units of Novolog insulin at the time of each meal. Check your glucose fasting in the morning every day, and also prior to each meal and at bedtime (4 times per day).

## 2012-08-18 NOTE — Assessment & Plan Note (Signed)
Nystatin suspension rx'd today.  Therapeutic expectations and side effect profile of medication discussed today.  Patient's questions answered.

## 2012-08-18 NOTE — Assessment & Plan Note (Signed)
Classic symptoms + random glucose 481. Initially, will start him on insulin to get things controlled quicker and get him feeling better. Levemir 10 U qhs--first dose given in office today to demonstrate injection technique to pt--sample given. Novolog 5 units SQ qAC-sample given.   Check HbA1c, CMET, CBC. Discussed basic dietary adjustments to make at this time but will get him to nutritionist soon for more detailed/expert education.

## 2012-08-18 NOTE — Assessment & Plan Note (Signed)
Suspect worsened neuropathic pain is resulting from his recent severe hyperglycemic state. Hopefully this will subside significantly as his glucoses normalize. Has failed adequate neurontin trial in the past.  Will start low dose amitriptyline: 25mg  qhs x 3d, then 25mg  bid.  Therapeutic expectations and side effect profile of medication discussed today.  Patient's questions answered.

## 2012-08-19 ENCOUNTER — Telehealth: Payer: Self-pay | Admitting: Family Medicine

## 2012-08-19 LAB — COMPREHENSIVE METABOLIC PANEL
ALT: 21 U/L (ref 0–53)
AST: 13 U/L (ref 0–37)
Alkaline Phosphatase: 155 U/L — ABNORMAL HIGH (ref 39–117)
BUN: 16 mg/dL (ref 6–23)
Calcium: 9.6 mg/dL (ref 8.4–10.5)
Chloride: 91 mEq/L — ABNORMAL LOW (ref 96–112)
Creatinine, Ser: 1 mg/dL (ref 0.4–1.5)
Total Bilirubin: 1 mg/dL (ref 0.3–1.2)

## 2012-08-19 NOTE — Telephone Encounter (Signed)
Glucose 170 at 8am.  After eating tomato and toast and 5units novolog glucose at 218.  Patients wife states he feels a little better.   He will continue current treatment and be here Friday for appt.

## 2012-08-19 NOTE — Telephone Encounter (Signed)
Noted  

## 2012-08-21 ENCOUNTER — Encounter: Payer: Self-pay | Admitting: Family Medicine

## 2012-08-21 ENCOUNTER — Ambulatory Visit (INDEPENDENT_AMBULATORY_CARE_PROVIDER_SITE_OTHER): Payer: 59 | Admitting: Family Medicine

## 2012-08-21 VITALS — BP 122/75 | HR 90 | Temp 98.6°F | Resp 16 | Ht 65.0 in | Wt 146.0 lb

## 2012-08-21 DIAGNOSIS — M79609 Pain in unspecified limb: Secondary | ICD-10-CM

## 2012-08-21 DIAGNOSIS — E119 Type 2 diabetes mellitus without complications: Secondary | ICD-10-CM

## 2012-08-21 DIAGNOSIS — B370 Candidal stomatitis: Secondary | ICD-10-CM

## 2012-08-21 DIAGNOSIS — M79604 Pain in right leg: Secondary | ICD-10-CM

## 2012-08-21 DIAGNOSIS — E162 Hypoglycemia, unspecified: Secondary | ICD-10-CM

## 2012-08-21 MED ORDER — INSULIN PEN NEEDLE 32G X 6 MM MISC: Status: DC

## 2012-08-21 MED ORDER — GLUCOSE BLOOD VI STRP: ORAL_STRIP | Status: DC

## 2012-08-21 MED ORDER — METFORMIN HCL 500 MG PO TABS: ORAL_TABLET | ORAL | Status: DC

## 2012-08-21 MED ORDER — FREESTYLE LANCETS MISC: Status: DC

## 2012-08-21 NOTE — Assessment & Plan Note (Signed)
Improving appropriately with nystatin suspension.

## 2012-08-21 NOTE — Progress Notes (Signed)
OFFICE NOTE  08/21/2012  CC:  Chief Complaint  Patient presents with  . Diabetes     HPI: Patient is a 47 y.o. Caucasian male who is here for 3 day f/u new onset DM 2. Started Levemir 10 U qhs and novolog 5 U qAC last visit and got labs.  Also started amitriptyline for right leg neuropathic pain. Also gave nystatin for thrush on tongue. He feels much improved.  Vision is less blurry, right leg hurts less and the feeling is a bit better in it. Ginette Pitman is resolving, taste of food is improving.  Reviewed glucoses and insulin dosing: he titrated his mealtime insulin to 10 U novolog after about 1 and 1/2 days on 5 mg qAC b/c he was still having 2h PP readings in 300s or so.  His last couple of fasting glucoses have been around 115. He is unable to work at this time due to generalized weakness and poor vision----his job duties require him to have good vision.   Pertinent PMH:  Past Medical History  Diagnosis Date  . Hypothyroidism   . Back pain     s/p back surgery  . Migraines   . Tobacco dependence   . PLANTAR WART, RIGHT 10/09/2009  DM 2, no onset.  Past surgical, social, and family history reviewed and no changes noted since last office visit.  MEDS: see HPI above Outpatient Prescriptions Prior to Visit  Medication Sig Dispense Refill  . amitriptyline (ELAVIL) 25 MG tablet 1 tab po qhs x 3d then 1 tab po bid  60 tablet  1  . HYDROcodone-acetaminophen (NORCO) 10-325 MG per tablet Take 1 tablet by mouth 3 (three) times daily.      Marland Kitchen ibuprofen (ADVIL,MOTRIN) 200 MG tablet Take 200 mg by mouth every 6 (six) hours as needed.      . Insulin Pen Needle 32G X 6 MM MISC 1 Syringe by Does not apply route 4 (four) times daily as needed.  30 each  0  . nystatin (MYCOSTATIN) 100000 UNIT/ML suspension 1 tsp orally--swish, gargle, and spit--4 times per day  240 mL  0  . SYNTHROID 112 MCG tablet TAKE 1 TABLET BY MOUTH EVERY DAY  30 tablet  5   No facility-administered medications prior to  visit.    PE: Blood pressure 122/75, pulse 90, temperature 98.6 F (37 C), temperature source Temporal, resp. rate 16, height 5\' 5"  (1.651 m), weight 146 lb (66.225 kg), SpO2 100.00%. Gen: Alert, well appearing.  Patient is oriented to person, place, time, and situation. ENT: tonuge with minimal white film, oral mucosa pink and moist. CV: RRR, no m/r/g.   LUNGS: CTA bilat, nonlabored resps, good aeration in all lung fields. ABD: soft, NT/ND EXT: no clubbing, cyanosis, or edema.  Right leg with a bit more sensation to light touch compared to 3 d/a, leg strength normal.  LABS:    Chemistry      Component Value Date/Time   NA 131* 08/18/2012 1149   K 4.1 08/18/2012 1149   CL 91* 08/18/2012 1149   CO2 31 08/18/2012 1149   BUN 16 08/18/2012 1149   CREATININE 1.0 08/18/2012 1149   CREATININE 0.71 06/27/2011 1516      Component Value Date/Time   CALCIUM 9.6 08/18/2012 1149   ALKPHOS 155* 08/18/2012 1149   AST 13 08/18/2012 1149   ALT 21 08/18/2012 1149   BILITOT 1.0 08/18/2012 1149     Lab Results  Component Value Date   WBC 12.0* 08/18/2012  HGB 15.7 08/18/2012   HCT 46.1 08/18/2012   MCV 92.3 08/18/2012   PLT 228.0 08/18/2012   Lab Results  Component Value Date   HGBA1C 10.4* 08/18/2012     IMPRESSION AND PLAN:  Diabetes mellitus, new onset Improving appropriately. Start metformin 500mg  bid, increase to 1000 mg bid in 1 week. Discussed decreasing levemir back to 8 qhs and novolog back to 5 U qac--and we discussed close monitoring and appropriate insulin adjustments in detail. Hope to have him on oral meds only in 76mo.  Discussed some basics of HbA1c monitoring, routine diabetes f/u care and monitoring for long term complications, diet, etc. At next f/u visit I'll order referral to nutritionist. Out of work until 08/31/12, and I'll see him again in the office on that same day.   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 Improving with glucose normalization  and recent start of amitriptyline 25mg --he starts the bid dosing today or tomorrow.  Oral thrush Improving appropriately with nystatin suspension.   An After Visit Summary was printed and given to the patient.  FOLLOW UP: 10d

## 2012-08-21 NOTE — Assessment & Plan Note (Signed)
Chronic neuropathic pain worsened by recent hyperglycemic state. Improving with glucose normalization and recent start of amitriptyline 25mg --he starts the bid dosing today or tomorrow.

## 2012-08-21 NOTE — Assessment & Plan Note (Addendum)
Improving appropriately. Start metformin 500mg  bid, increase to 1000 mg bid in 1 week. Discussed decreasing levemir back to 8 qhs and novolog back to 5 U qac--and we discussed close monitoring and appropriate insulin adjustments in detail. Hope to have him on oral meds only in 73mo.  Discussed some basics of HbA1c monitoring, routine diabetes f/u care and monitoring for long term complications, diet, etc. At next f/u visit I'll order referral to nutritionist. Out of work until 08/31/12, and I'll see him again in the office on that same day.

## 2012-08-24 DIAGNOSIS — Z0279 Encounter for issue of other medical certificate: Secondary | ICD-10-CM

## 2012-08-31 ENCOUNTER — Ambulatory Visit (INDEPENDENT_AMBULATORY_CARE_PROVIDER_SITE_OTHER): Payer: 59 | Admitting: Family Medicine

## 2012-08-31 ENCOUNTER — Encounter: Payer: Self-pay | Admitting: Family Medicine

## 2012-08-31 VITALS — BP 111/74 | HR 86 | Temp 97.4°F | Resp 16 | Ht 65.0 in | Wt 147.0 lb

## 2012-08-31 DIAGNOSIS — H538 Other visual disturbances: Secondary | ICD-10-CM | POA: Insufficient documentation

## 2012-08-31 DIAGNOSIS — M79604 Pain in right leg: Secondary | ICD-10-CM

## 2012-08-31 DIAGNOSIS — M79609 Pain in unspecified limb: Secondary | ICD-10-CM

## 2012-08-31 DIAGNOSIS — IMO0001 Reserved for inherently not codable concepts without codable children: Secondary | ICD-10-CM

## 2012-08-31 MED ORDER — AMITRIPTYLINE HCL 25 MG PO TABS: ORAL_TABLET | ORAL | Status: DC

## 2012-08-31 MED ORDER — GLIPIZIDE ER 5 MG PO TB24: 5.0000 mg | ORAL_TABLET | Freq: Every day | ORAL | Status: DC

## 2012-08-31 NOTE — Progress Notes (Addendum)
OFFICE NOTE  08/31/2012  CC:  Chief Complaint  Patient presents with  . Diabetes     HPI: Patient is a 47 y.o. Caucasian male who is here for 10d f/u new onset DM 2. Leg still feeling improved, glucoses down into 100s-200s, rare 70 or 60 with some tremulousness. He had given himself 10 U of insulin instead of his usual 5 U at the prev meal.  Gluc came up quick with food. Says he still has blurry vision and can't work.  Is going to eye doctor this week, maybe get glasses as early as next week (IF this is indeed what will help him). Ginette Pitman is gone.  No side effects from the amitriptyline for his leg, says leg pain improved quite a bit.  Pertinent PMH:  DM 2 Hypoth Right leg neuropathic pain  MEDS:  Outpatient Prescriptions Prior to Visit  Medication Sig Dispense Refill  . amitriptyline (ELAVIL) 25 MG tablet 1 tab po qhs x 3d then 1 tab po bid  60 tablet  1  . glucose blood (FREESTYLE INSULINX TEST) test strip Use as instructed  50 each  6  . HYDROcodone-acetaminophen (NORCO) 10-325 MG per tablet Take 1 tablet by mouth 3 (three) times daily.      Marland Kitchen ibuprofen (ADVIL,MOTRIN) 200 MG tablet Take 200 mg by mouth every 6 (six) hours as needed.      . Insulin Pen Needle 32G X 6 MM MISC Use with levemir injection once nightly and also with meal-time insulin (novolog or humalog) up to 3 times per day  50 each  1  . Lancets (FREESTYLE) lancets Use as instructed  100 each  6  . metFORMIN (GLUCOPHAGE) 500 MG tablet 1 tab po bid x 7d, then increase to 2 tabs po bid  60 tablet  1  . nystatin (MYCOSTATIN) 100000 UNIT/ML suspension 1 tsp orally--swish, gargle, and spit--4 times per day  240 mL  0  . SYNTHROID 112 MCG tablet TAKE 1 TABLET BY MOUTH EVERY DAY  30 tablet  5   No facility-administered medications prior to visit.    PE: Blood pressure 111/74, pulse 86, temperature 97.4 F (36.3 C), temperature source Temporal, resp. rate 16, height 5\' 5"  (1.651 m), weight 147 lb (66.679 kg), SpO2  99.00%. Gen: Alert, well appearing.  Patient is oriented to person, place, time, and situation. Mouth: no thrush. Legs: strength and sensation intact/symmetric  IMPRESSION AND PLAN:  1) Continue 1000 mg bid metformin.  Add glipizide XL 5mg  qd.   D/c mealtime insulin--as sugars allow.  Continue levemir, ween down dose if possible---reviewed target glucose range for fasting and 2H PP states. He is to schedule exam with his ophthalmologist ASAP. Change gluc monitoring to fasting qAM and ONE 2 H PP measurement daily. Nutrition referral. I have written a letter to excuse him from work another 2 wks due to ongoing blurry vision---date set to go back 09/21/12 stated in letter today.  2) His thrush is resolved.  3) Right leg pain (neuropathic): much improved.  Continue amitriptyline 25mg , 1 qAM and 2 qhs.  F/u 2 wks, at which time we'll likely to urine microalb/cr.  FOLLOW UP:  2 wks f/u DM 2, leg pain, blurry vision

## 2012-09-14 ENCOUNTER — Ambulatory Visit: Payer: 59 | Admitting: Family Medicine

## 2012-09-14 ENCOUNTER — Encounter: Payer: Self-pay | Admitting: Family Medicine

## 2012-09-14 ENCOUNTER — Ambulatory Visit (INDEPENDENT_AMBULATORY_CARE_PROVIDER_SITE_OTHER): Payer: 59 | Admitting: Family Medicine

## 2012-09-14 VITALS — BP 107/73 | HR 103 | Temp 99.2°F | Resp 16 | Ht 65.0 in | Wt 146.0 lb

## 2012-09-14 DIAGNOSIS — Z8739 Personal history of other diseases of the musculoskeletal system and connective tissue: Secondary | ICD-10-CM

## 2012-09-14 DIAGNOSIS — H538 Other visual disturbances: Secondary | ICD-10-CM

## 2012-09-14 DIAGNOSIS — IMO0001 Reserved for inherently not codable concepts without codable children: Secondary | ICD-10-CM

## 2012-09-14 DIAGNOSIS — M79604 Pain in right leg: Secondary | ICD-10-CM

## 2012-09-14 DIAGNOSIS — E119 Type 2 diabetes mellitus without complications: Secondary | ICD-10-CM

## 2012-09-14 DIAGNOSIS — M79609 Pain in unspecified limb: Secondary | ICD-10-CM

## 2012-09-14 LAB — MICROALBUMIN / CREATININE URINE RATIO
Microalb Creat Ratio: 1.7 mg/g (ref 0.0–30.0)
Microalb, Ur: 2.3 mg/dL — ABNORMAL HIGH (ref 0.0–1.9)

## 2012-09-14 MED ORDER — METFORMIN HCL 1000 MG PO TABS: 1000.0000 mg | ORAL_TABLET | Freq: Two times a day (BID) | ORAL | Status: DC

## 2012-09-14 MED ORDER — INSULIN DETEMIR 100 UNIT/ML FLEXPEN: PEN_INJECTOR | SUBCUTANEOUS | Status: DC

## 2012-09-14 NOTE — Patient Instructions (Signed)
Slowly ween away your levemir insulin 1 unit at a time---stay on this if fasting glucose is consistently >110. Continue your current dosing of your other medications.

## 2012-09-14 NOTE — Progress Notes (Signed)
OFFICE NOTE  09/14/2012  CC:  Chief Complaint  Patient presents with  . Diabetes    follow up     HPI: Patient is a 47 y.o. Caucasian male who is here for 2 week f/u DM 2. Doing well.  Got new eyeglasses--eye MD says no Diab retpthy.  Blurriness is gone. Avg fasting gluc 80-110, 2H PP avg 140-150 (highest was 180).  He is still taking 8 U levemir and also glipizide XL 5mg  qd and 1000 mg metformin bid.   His official return to work date is supposed to be 09/21/12 and he does feel ready to go back. He is ready to see nutritionist now.  Still doing well regarding leg pain --taking 1 25mg  elavil qAM and 2 qhs.  Denies polyuria, polydipsia, dizziness, tremors, or focal weakness.  Energy level good, appetite good.  Pertinent PMH:  Past Medical History  Diagnosis Date  . Hypothyroidism   . Back pain     s/p back surgery  . Migraines   . Tobacco dependence   . PLANTAR WART, RIGHT 10/09/2009  . Diabetes mellitus, type 2 08/2012--dx'd  . Neuropathic pain of right lower extremity   . History of low back pain 06/24/2011   Past surgical, social, and family history reviewed and no changes noted since last office visit.  MEDS:  Outpatient Prescriptions Prior to Visit  Medication Sig Dispense Refill  . amitriptyline (ELAVIL) 25 MG tablet 1 tab po qAM and 2 tabs po qhs  60 tablet  1  . glipiZIDE (GLUCOTROL XL) 5 MG 24 hr tablet Take 1 tablet (5 mg total) by mouth daily.  30 tablet  1  . glucose blood (FREESTYLE INSULINX TEST) test strip Use as instructed  50 each  6  . HYDROcodone-acetaminophen (NORCO) 10-325 MG per tablet Take 1 tablet by mouth 3 (three) times daily.      Marland Kitchen ibuprofen (ADVIL,MOTRIN) 200 MG tablet Take 200 mg by mouth every 6 (six) hours as needed.      . Insulin Pen Needle 32G X 6 MM MISC Use with levemir injection once nightly and also with meal-time insulin (novolog or humalog) up to 3 times per day  50 each  1  . Lancets (FREESTYLE) lancets Use as instructed  100 each  6   . nystatin (MYCOSTATIN) 100000 UNIT/ML suspension 1 tsp orally--swish, gargle, and spit--4 times per day  240 mL  0  . SYNTHROID 112 MCG tablet TAKE 1 TABLET BY MOUTH EVERY DAY  30 tablet  5  . metFORMIN (GLUCOPHAGE) 500 MG tablet 1 tab po bid x 7d, then increase to 2 tabs po bid  60 tablet  1   No facility-administered medications prior to visit.    PE: Blood pressure 107/73, pulse 103, temperature 99.2 F (37.3 C), temperature source Temporal, resp. rate 16, height 5\' 5"  (1.651 m), weight 146 lb (66.225 kg), SpO2 98.00%. Gen: Alert, well appearing.  Patient is oriented to person, place, time, and situation. AFFECT: pleasant, lucid thought and speech. No further exam today.  IMPRESSION AND PLAN:  Diabetes mellitus, new onset Doing nicely. Will have him slowly ween away lantus as his fasting glucose tolerates. Ordered referral to nutritionist today. Continue all orals at current doses.  May down-adjust frequency of glucose monitoring as appropriate--discussed details of this today. Check urine microalb/cr ratio today. First foot exam at next f/u visit in 70mo---HbA1c repeat at that time, too.    Right leg pain Chronic neuropathic pain related to past back  probs/radiculopathy plus this was worsened by his high sugars.   This is doing great on amitriptyline 25mg  qAM and 50mg  qhs PLUS good sugar control. Continue this.  Blurry vision, bilateral Resolved. Recent good report from ophtho---needs rescreen in 30mo per ophtho due to new dx.  Pt aware. He also is seeing better since glucoses are down + new bifocal lens rx.  History of low back pain Chronic.  Was seeing a colleauge of his neurosurgeon's who was giving back injections and prescribing his pain pills. Injections did not help.  Leg pain gone now.  LB pain is better, too, but he still occ requires some hydrocodone/APAP (says much less lately since dx of DM and time off work and taking amitriptyline).  He asks if he can get his  pain med through me if needed in the future.  I said yes, I would rx this for him as appropriate and he would not need to keep going to the neurosurgeon's office just for this.  He says he did NOT sign a pain contract with them (says he usually got #60 tabs and this always lasted > 37mo).   An After Visit Summary was printed and given to the patient.  Return to work 09/21/12 as previously planned.  FOLLOW UP: 2 mo

## 2012-09-14 NOTE — Assessment & Plan Note (Signed)
Chronic neuropathic pain related to past back probs/radiculopathy plus this was worsened by his high sugars.   This is doing great on amitriptyline 25mg  qAM and 50mg  qhs PLUS good sugar control. Continue this.

## 2012-09-14 NOTE — Assessment & Plan Note (Signed)
Doing nicely. Will have him slowly ween away lantus as his fasting glucose tolerates. Ordered referral to nutritionist today. Continue all orals at current doses.  May down-adjust frequency of glucose monitoring as appropriate--discussed details of this today. Check urine microalb/cr ratio today. First foot exam at next f/u visit in 56mo---HbA1c repeat at that time, too.

## 2012-09-14 NOTE — Assessment & Plan Note (Addendum)
Resolved. Recent good report from ophtho---needs rescreen in 39mo per ophtho due to new dx.  Pt aware. He also is seeing better since glucoses are down + new bifocal lens rx.

## 2012-09-14 NOTE — Assessment & Plan Note (Signed)
Chronic.  Was seeing a colleauge of his neurosurgeon's who was giving back injections and prescribing his pain pills. Injections did not help.  Leg pain gone now.  LB pain is better, too, but he still occ requires some hydrocodone/APAP (says much less lately since dx of DM and time off work and taking amitriptyline).  He asks if he can get his pain med through me if needed in the future.  I said yes, I would rx this for him as appropriate and he would not need to keep going to the neurosurgeon's office just for this.  He says he did NOT sign a pain contract with them (says he usually got #60 tabs and this always lasted > 65mo).

## 2012-09-16 ENCOUNTER — Encounter: Payer: Self-pay | Admitting: Family Medicine

## 2012-09-22 ENCOUNTER — Telehealth: Payer: Self-pay | Admitting: Family Medicine

## 2012-09-22 NOTE — Telephone Encounter (Signed)
Since patient started glipizide he has been having really bad indigestion that leads to vomiting, even with acid reducer.  Please advise.

## 2012-09-23 MED ORDER — GLIMEPIRIDE 2 MG PO TABS: 2.0000 mg | ORAL_TABLET | Freq: Every day | ORAL | Status: DC

## 2012-09-23 NOTE — Telephone Encounter (Signed)
He should be taking this food when he starts eating breakfast. If he is already taking it this way, then d/c this med and pls send in eRx for glimeperide 2mg , 1 tab po qAM, #30, RF x 2.-thx

## 2012-09-23 NOTE — Telephone Encounter (Signed)
Patients states that he is taking medication with food.  He is okay with switching medication.

## 2012-10-01 ENCOUNTER — Ambulatory Visit (INDEPENDENT_AMBULATORY_CARE_PROVIDER_SITE_OTHER): Payer: 59 | Admitting: Family Medicine

## 2012-10-01 ENCOUNTER — Encounter: Payer: Self-pay | Admitting: Family Medicine

## 2012-10-01 VITALS — BP 112/64 | HR 84 | Temp 98.1°F | Ht 65.0 in | Wt 150.2 lb

## 2012-10-01 DIAGNOSIS — E161 Other hypoglycemia: Secondary | ICD-10-CM | POA: Insufficient documentation

## 2012-10-01 DIAGNOSIS — E119 Type 2 diabetes mellitus without complications: Secondary | ICD-10-CM

## 2012-10-01 DIAGNOSIS — E162 Hypoglycemia, unspecified: Secondary | ICD-10-CM

## 2012-10-01 MED ORDER — ONETOUCH ULTRA SYSTEM W/DEVICE KIT: PACK | Status: DC

## 2012-10-01 MED ORDER — AMITRIPTYLINE HCL 25 MG PO TABS: ORAL_TABLET | ORAL | Status: DC

## 2012-10-01 MED ORDER — GLIMEPIRIDE 2 MG PO TABS: ORAL_TABLET | ORAL | Status: DC

## 2012-10-01 MED ORDER — GLUCAGON (RDNA) 1 MG IJ KIT: PACK | INTRAMUSCULAR | Status: DC

## 2012-10-01 NOTE — Progress Notes (Signed)
OFFICE NOTE  10/01/2012  CC:  Chief Complaint  Patient presents with  . Hypoglycemia     HPI: Patient is a 47 y.o. Caucasian male who is here for low glucose this morning: about 40.   Was pale, weak, not talking clearly, but was able to drink a mt dew and eat 1/2 of egg sandwich. Felt much better, gluc 120s at that time.  Gluc's have been normal to low normal since starting amaryl 2mg  qam 4 days ago. Has still been on 3 units levemir qhs but didn't take it last night b/c gluc at bedtime was 118. Currently he feels well, denies any symptoms at all.  Pertinent PMH:  Past Medical History  Diagnosis Date  . Hypothyroidism   . Back pain     s/p back surgery  . Migraines   . Tobacco dependence   . PLANTAR WART, RIGHT 10/09/2009  . Diabetes mellitus, type 2 08/2012--dx'd  . Neuropathic pain of right lower extremity   . History of low back pain 06/24/2011   Past surgical, social, and family history reviewed and no changes noted since last office visit.  MEDS:  Outpatient Prescriptions Prior to Visit  Medication Sig Dispense Refill  . amitriptyline (ELAVIL) 25 MG tablet 1 tab po qAM and 2 tabs po qhs  60 tablet  1  . glimepiride (AMARYL) 2 MG tablet Take 1 tablet (2 mg total) by mouth daily after breakfast.  30 tablet  3  . glipiZIDE (GLUCOTROL XL) 5 MG 24 hr tablet Take 1 tablet (5 mg total) by mouth daily.  30 tablet  1  . glucose blood (FREESTYLE INSULINX TEST) test strip Use as instructed  50 each  6  . HYDROcodone-acetaminophen (NORCO) 10-325 MG per tablet Take 1 tablet by mouth 3 (three) times daily.      Marland Kitchen ibuprofen (ADVIL,MOTRIN) 200 MG tablet Take 200 mg by mouth every 6 (six) hours as needed.      . Insulin Detemir (LEVEMIR FLEXTOUCH) 100 UNIT/ML SOPN 8 U qhs  3 mL  0  . Insulin Pen Needle 32G X 6 MM MISC Use with levemir injection once nightly and also with meal-time insulin (novolog or humalog) up to 3 times per day  50 each  1  . Lancets (FREESTYLE) lancets Use as  instructed  100 each  6  . metFORMIN (GLUCOPHAGE) 1000 MG tablet Take 1 tablet (1,000 mg total) by mouth 2 (two) times daily with a meal.  60 tablet  6  . nystatin (MYCOSTATIN) 100000 UNIT/ML suspension 1 tsp orally--swish, gargle, and spit--4 times per day  240 mL  0  . SYNTHROID 112 MCG tablet TAKE 1 TABLET BY MOUTH EVERY DAY  30 tablet  5   No facility-administered medications prior to visit.    PE: Blood pressure 112/64, pulse 84, temperature 98.1 F (36.7 C), temperature source Temporal, height 5\' 5"  (1.651 m), weight 150 lb 4 oz (68.153 kg), SpO2 98.00%. Gen: Alert, well appearing.  Patient is oriented to person, place, time, and situation. AFFECT: pleasant, lucid thought and speech. No further exam today.  IMPRESSION AND PLAN:  Hypoglycemic rxn: discussed what to do in various situations like this, including possible use of glucagon injection--rx'd today. We'll cut his amaryl dosing down to 1/2 of a 2mg  tab po qAM. Stop levemir.  Continue glucophage 1000 mg bid. Continue bid gluc monitoring; insurer apparently covers one touch system per pt report. All we have in our EMR is one touch ultra kit/device so  I rx'd this and a separate paper rx for additional strips for this. RF'd amitriptyline today per pt's request: 25mg  tab, 1 qAM and 2 qhs, #90 per month. Note written to return to work tomorrow, excused from duties today.  FOLLOW UP: prn (already scheduled routine f/u visit).

## 2012-10-13 ENCOUNTER — Ambulatory Visit: Payer: 59

## 2012-11-12 ENCOUNTER — Ambulatory Visit (INDEPENDENT_AMBULATORY_CARE_PROVIDER_SITE_OTHER): Payer: 59 | Admitting: Family Medicine

## 2012-11-12 ENCOUNTER — Encounter: Payer: Self-pay | Admitting: Family Medicine

## 2012-11-12 VITALS — BP 112/78 | HR 96 | Temp 98.8°F | Resp 16 | Ht 65.0 in | Wt 153.0 lb

## 2012-11-12 DIAGNOSIS — Z8739 Personal history of other diseases of the musculoskeletal system and connective tissue: Secondary | ICD-10-CM

## 2012-11-12 DIAGNOSIS — Z23 Encounter for immunization: Secondary | ICD-10-CM

## 2012-11-12 DIAGNOSIS — IMO0001 Reserved for inherently not codable concepts without codable children: Secondary | ICD-10-CM

## 2012-11-12 DIAGNOSIS — E039 Hypothyroidism, unspecified: Secondary | ICD-10-CM

## 2012-11-12 DIAGNOSIS — E119 Type 2 diabetes mellitus without complications: Secondary | ICD-10-CM

## 2012-11-12 MED ORDER — TRAMADOL HCL 50 MG PO TABS: 50.0000 mg | ORAL_TABLET | Freq: Three times a day (TID) | ORAL | Status: DC | PRN

## 2012-11-12 NOTE — Progress Notes (Addendum)
OFFICE NOTE  11/12/2012  CC:  Chief Complaint  Patient presents with  . Diabetes     HPI: Patient is a 47 y.o. Caucasian male who is here for f/u DM 2, hypothyroidism. Has weened off insulin. Fasting 90-110.  Mid morning 60s-70s about 2 hours after taking amaryl and metformin.  Late afternoon and evenings are 130s.  Compliant with oral meds, went to nutritionist x 1 and felt like it was helpful but he and dietitian agreed no further visits were needed. Denies any tingling, burning, or numbness in feet. No right leg PN pain as long as he takes amitriptyline at current dosing.  Has some diffuse LBP some now that he has been back at work, needs 1 vicodin 10/325 qd on avg. He is open to stepping down to tramadol for a trial, discussed need for controlled substance contract.  Pertinent PMH:  Past Medical History  Diagnosis Date  . Hypothyroidism   . Back pain     s/p back surgery  . Migraines   . Tobacco dependence   . PLANTAR WART, RIGHT 10/09/2009  . Diabetes mellitus, type 2 08/2012--dx'd  . Neuropathic pain of right lower extremity   . History of low back pain 06/24/2011   Past surgical, social, and family history reviewed and no changes noted since last office visit.  MEDS:  Outpatient Prescriptions Prior to Visit  Medication Sig Dispense Refill  . amitriptyline (ELAVIL) 25 MG tablet 1 tab po qAM and 2 tabs po qhs  90 tablet  6  . glimepiride (AMARYL) 2 MG tablet 1/2 tab po qAM  30 tablet  3  . glucose blood (FREESTYLE INSULINX TEST) test strip Use as instructed  50 each  6  . Lancets (FREESTYLE) lancets Use as instructed  100 each  6  . metFORMIN (GLUCOPHAGE) 1000 MG tablet Take 1 tablet (1,000 mg total) by mouth 2 (two) times daily with a meal.  60 tablet  6  . SYNTHROID 112 MCG tablet TAKE 1 TABLET BY MOUTH EVERY DAY  30 tablet  5  . HYDROcodone-acetaminophen (NORCO) 10-325 MG per tablet Take 1 tablet by mouth 3 (three) times daily.      . Blood Glucose Monitoring Suppl  (ONE TOUCH ULTRA SYSTEM KIT) W/DEVICE KIT Use as directed to monitor glucose  1 each  0  . glucagon 1 MG injection Inject 1 mg IM prn hypoglycemic emergency  1 each  3  . ibuprofen (ADVIL,MOTRIN) 200 MG tablet Take 200 mg by mouth every 6 (six) hours as needed.      . Insulin Pen Needle 32G X 6 MM MISC Use with levemir injection once nightly and also with meal-time insulin (novolog or humalog) up to 3 times per day  50 each  1   No facility-administered medications prior to visit.    PE: Blood pressure 112/78, pulse 96, temperature 98.8 F (37.1 C), temperature source Temporal, resp. rate 16, height 5\' 5"  (1.651 m), weight 153 lb (69.4 kg), SpO2 97.00%. Gen: Alert, well appearing.  Patient is oriented to person, place, time, and situation. Foot exam - bilateral normal; no swelling, tenderness or skin or vascular lesions. Color and temperature is normal. Sensation is intact. Peripheral pulses are palpable. Toenails are normal.  LAB: none today  IMPRESSION AND PLAN:  Diabetes mellitus, new onset Much improved, off of insulin. I recommended he stop amaryl due to some low glucoses before lunch and normal glucoses otherwise. Continue metformin and diabetic diet. Feet exam normal today. Recheck  HbA1c today. Will obtain fasting lipid panel at next f/u in 4 mo.  History of low back pain Has been requiring some vicodin 10/325--approx 1 tab a day since getting back into work. Right leg radiculopathy/peripheral neuropathy sx's essentially quiescent while on amitriptyline at current dosing. Controlled substances contract reviewed, signed, placed in chart today. Decided on trial of step down in pain med to tramadol 50mg , 1-2 q6h prn, #40, RF x 3.  HYPOTHYROIDISM Lab Results  Component Value Date   TSH 1.61 05/13/2012   Plan on recheck of TSH 05/2013.   Flu vaccine IM today. Pt states he got pneumovax at a visit here earlier this summer after dx of DM 2 but I cannot see this in chart--will  try to clarify prior to his next f/u.  FOLLOW UP: 4 mo    ADDENDUM: I see no documentation of pt getting pneumovax at any past visit, so I'll recommend he get this at future f/u visit.--PM

## 2012-11-12 NOTE — Assessment & Plan Note (Addendum)
Much improved, off of insulin. I recommended he stop amaryl due to some low glucoses before lunch and normal glucoses otherwise. Continue metformin and diabetic diet. Feet exam normal today. Recheck HbA1c today. Will obtain fasting lipid panel at next f/u in 4 mo.

## 2012-11-12 NOTE — Assessment & Plan Note (Signed)
Lab Results  Component Value Date   TSH 1.61 05/13/2012   Plan on recheck of TSH 05/2013.

## 2012-11-12 NOTE — Assessment & Plan Note (Signed)
Has been requiring some vicodin 10/325--approx 1 tab a day since getting back into work. Right leg radiculopathy/peripheral neuropathy sx's essentially quiescent while on amitriptyline at current dosing. Controlled substances contract reviewed, signed, placed in chart today. Decided on trial of step down in pain med to tramadol 50mg , 1-2 q6h prn, #40, RF x 3.

## 2012-11-13 LAB — HEMOGLOBIN A1C
Hgb A1c MFr Bld: 6.6 % — ABNORMAL HIGH (ref <5.7)
Mean Plasma Glucose: 143 mg/dL — ABNORMAL HIGH (ref <117)

## 2013-01-15 ENCOUNTER — Telehealth: Payer: Self-pay | Admitting: Family Medicine

## 2013-01-15 NOTE — Telephone Encounter (Signed)
Patient Information:  Caller Name: Lafonda Mosses  Phone: [phone]  Patient: Daniel Stevenson, Daniel Stevenson  Gender: Male  DOB: [birth year]  Age: 47 Years  PCP: Earley Favor [facility])  Office Follow Up:  Does the office need to follow up with this patient?: Yes  Instructions For The Office: Please advise about medication  RN Note:  Wife would like guidance about the Amaryl 2mg .  Would Dr. Marvel Plan like to restart.  Please contact Cell [phone]  Symptoms  Reason For Call & Symptoms: Wife states her husband is Diabetes and takes  Metformin 1000mg   bid. He checks his blood sugars 2-3 x daily. She reports that in the last two weeks he is receiving high readings in the 300's. this has occurred  three times..  Wife states that in October Dr. Marvel Plan stopped the Amaryl 2mg  .  Should he start the Amaryl again?  She states in the mornings his blood sugars is normally in 160's and during day 220.  The evening is when it goes to 300's. She states he is mointoring his diet.  GBS today 150 this morning.  Reviewed Health History In EMR: Yes  Reviewed Medications In EMR: Yes  Reviewed Allergies In EMR: Yes  Reviewed Surgeries / Procedures: Yes  Date of Onset of Symptoms: 12/25/2012  Treatments Tried: Metformin, Diet mointoring  Treatments Tried Worked: No  Guideline(s) Used:  Diabetes - High Blood Sugar  Disposition Per Guideline:   Discuss with PCP and Callback by Nurse Today  Reason For Disposition Reached:   Caller has NON-URGENT medication question about med that PCP prescribed and triager unable to answer question  Advice Given:  Treatment - Liquids  Drink at least one glass (8 oz or 240 ml) of water per hour for the next 4 hours. (Reason: adequate hydration will reduce hyperglycemia).  Generally, you should try to drink 6-8 glasses of water each day.  Measure and Record Your Blood Glucose  Every day you should measure your blood glucose before breakfast and before going to bed.  Record  the results and show them to your doctor at your next office visit.  Call Back If:  Blood glucose more than 300 mg/dL (29.5 mmol/l), 2 or more times in a row.  Vomiting lasting more than 4 hours or unable to drink any liquids.  Rapid breathing occurs  You become worse.  RN Overrode Recommendation:  Follow Up With Office Later  Please advise about medication

## 2013-01-18 ENCOUNTER — Ambulatory Visit (INDEPENDENT_AMBULATORY_CARE_PROVIDER_SITE_OTHER): Payer: 59 | Admitting: Family Medicine

## 2013-01-18 ENCOUNTER — Encounter: Payer: Self-pay | Admitting: Family Medicine

## 2013-01-18 VITALS — BP 124/84 | HR 88 | Temp 98.6°F | Resp 16 | Ht 65.0 in | Wt 153.0 lb

## 2013-01-18 DIAGNOSIS — K219 Gastro-esophageal reflux disease without esophagitis: Secondary | ICD-10-CM

## 2013-01-18 DIAGNOSIS — IMO0001 Reserved for inherently not codable concepts without codable children: Secondary | ICD-10-CM

## 2013-01-18 MED ORDER — INSULIN DETEMIR 100 UNIT/ML FLEXPEN: PEN_INJECTOR | SUBCUTANEOUS | Status: DC

## 2013-01-18 MED ORDER — GLIMEPIRIDE 2 MG PO TABS: 2.0000 mg | ORAL_TABLET | Freq: Every day | ORAL | Status: DC

## 2013-01-18 MED ORDER — LANSOPRAZOLE 30 MG PO TBDP: 30.0000 mg | ORAL_TABLET | Freq: Every day | ORAL | Status: DC

## 2013-01-18 NOTE — Progress Notes (Signed)
OFFICE NOTE Pre visit review using our clinic review tool, if applicable. No additional management support is needed unless otherwise documented below in the visit note.  01/18/2013  CC:  Chief Complaint  Patient presents with  . Blood Sugar Problem    glucose doesn't seem to be under control since Thanksgiving     HPI: Patient is a 47 y.o. Caucasian male who is here for elevated glucoses. Last 3 wks or so.  First noted his right leg neuropathic pain bothering him more and so he decided to start monitoring his gluc's again and noted they were 300s fasting and postprandial.  He denies change in diet or activity level during this time.  He has been compliant with metformin 1000 mg bid. Has been compliant with synthroid.  +Polyuria and polydipsia, fatigue lately.  No s/s of infection such as cough, ST, fevers, dysuria, vomiting, or diarrhea.  He has felt much more GER lately and takes 2 zantac daily and still has sx's.  Denies abd pain.  Pertinent PMH:  Past Medical History  Diagnosis Date  . Hypothyroidism   . Back pain     s/p back surgery  . Migraines   . Tobacco dependence   . PLANTAR WART, RIGHT 10/09/2009  . Diabetes mellitus, type 2 08/2012--dx'd  . Neuropathic pain of right lower extremity   . History of low back pain 06/24/2011   Past surgical, social, and family history reviewed and no changes noted since last office visit.  MEDS:  Outpatient Prescriptions Prior to Visit  Medication Sig Dispense Refill  . amitriptyline (ELAVIL) 25 MG tablet 1 tab po qAM and 2 tabs po qhs  90 tablet  6  . metFORMIN (GLUCOPHAGE) 1000 MG tablet Take 1 tablet (1,000 mg total) by mouth 2 (two) times daily with a meal.  60 tablet  6  . SYNTHROID 112 MCG tablet TAKE 1 TABLET BY MOUTH EVERY DAY  30 tablet  5  . Blood Glucose Monitoring Suppl (ONE TOUCH ULTRA SYSTEM KIT) W/DEVICE KIT Use as directed to monitor glucose  1 each  0  . glucagon 1 MG injection Inject 1 mg IM prn hypoglycemic emergency   1 each  3  . glucose blood (FREESTYLE INSULINX TEST) test strip Use as instructed  50 each  6  . ibuprofen (ADVIL,MOTRIN) 200 MG tablet Take 200 mg by mouth every 6 (six) hours as needed.      . Insulin Pen Needle 32G X 6 MM MISC Use with levemir injection once nightly and also with meal-time insulin (novolog or humalog) up to 3 times per day  50 each  1  . Lancets (FREESTYLE) lancets Use as instructed  100 each  6  . traMADol (ULTRAM) 50 MG tablet Take 1 tablet (50 mg total) by mouth every 8 (eight) hours as needed for pain.  40 tablet  3   No facility-administered medications prior to visit.    PE: Blood pressure 124/84, pulse 88, temperature 98.6 F (37 C), temperature source Temporal, resp. rate 16, height 5\' 5"  (1.651 m), weight 153 lb (69.4 kg), SpO2 100.00%. Gen: Alert, well appearing.  Patient is oriented to person, place, time, and situation. YNW:GNFA: no injection, icteris, swelling, or exudate.  EOMI, PERRLA. Mouth: lips without lesion/swelling.  Oral mucosa pink and moist. Oropharynx without erythema, exudate, or swelling.  Neck - No masses or thyromegaly or limitation in range of motion CV: RRR, no m/r/g.   LUNGS: CTA bilat, nonlabored resps, good aeration in all  lung fields. EXT: no clubbing, cyanosis, or edema.    IMPRESSION AND PLAN:  1) DM 2, worsening control/progression of dz. Lab Results  Component Value Date   HGBA1C 6.6* 11/12/2012   Instructions: Start 10 Units of Levemir at bedtime. Increase dose by 2 units every 3 days if fasting glucose not consistently in range of 100-110 or better.  After you have been on levemir for 3d, if your sugar is still > 170  Two hours after a meal then restart amaryl (glimepiride) at 1/2-1 tab with breakfast every day.   2) GERD, inadequately controlled on zantac 75mg  bid.  Needs to adjust diet better and will also start prevacid 30mg  qd.    FOLLOW UP: Keep appt set for 03/2013.

## 2013-01-18 NOTE — Patient Instructions (Signed)
Start 10 Units of Levemir at bedtime. Increase dose by 2 units every 3 days if fasting glucose not consistently in range of 100-110 or better.  After you have been on levemir for 3d, if your sugar is still > 170  Two hours after a meal then restart amaryl (glimepiride) at 1/2-1 tab with breakfast every day.

## 2013-02-16 ENCOUNTER — Other Ambulatory Visit: Payer: Self-pay | Admitting: Family Medicine

## 2013-02-16 MED ORDER — SYNTHROID 112 MCG PO TABS: ORAL_TABLET | ORAL | Status: DC

## 2013-03-15 ENCOUNTER — Encounter: Payer: Self-pay | Admitting: Family Medicine

## 2013-03-15 ENCOUNTER — Ambulatory Visit (INDEPENDENT_AMBULATORY_CARE_PROVIDER_SITE_OTHER): Payer: 59 | Admitting: Family Medicine

## 2013-03-15 VITALS — BP 138/95 | HR 94 | Temp 99.0°F | Resp 18 | Ht 65.0 in | Wt 157.0 lb

## 2013-03-15 DIAGNOSIS — E039 Hypothyroidism, unspecified: Secondary | ICD-10-CM

## 2013-03-15 DIAGNOSIS — K219 Gastro-esophageal reflux disease without esophagitis: Secondary | ICD-10-CM

## 2013-03-15 DIAGNOSIS — IMO0001 Reserved for inherently not codable concepts without codable children: Secondary | ICD-10-CM

## 2013-03-15 DIAGNOSIS — Z23 Encounter for immunization: Secondary | ICD-10-CM

## 2013-03-15 DIAGNOSIS — J019 Acute sinusitis, unspecified: Secondary | ICD-10-CM

## 2013-03-15 DIAGNOSIS — E1165 Type 2 diabetes mellitus with hyperglycemia: Principal | ICD-10-CM

## 2013-03-15 LAB — LIPID PANEL
CHOLESTEROL: 140 mg/dL (ref 0–200)
HDL: 38.8 mg/dL — AB (ref >39.00)
LDL Cholesterol: 88 mg/dL (ref 0–99)
TRIGLYCERIDES: 66 mg/dL (ref 0.0–149.0)
Total CHOL/HDL Ratio: 4
VLDL: 13.2 mg/dL (ref 0.0–40.0)

## 2013-03-15 LAB — HEMOGLOBIN A1C: HEMOGLOBIN A1C: 6.6 % — AB (ref 4.6–6.5)

## 2013-03-15 LAB — TSH: TSH: 2.25 u[IU]/mL (ref 0.35–5.50)

## 2013-03-15 MED ORDER — AZITHROMYCIN 250 MG PO TABS: ORAL_TABLET | ORAL | Status: DC

## 2013-03-15 MED ORDER — ASPIRIN 81 MG PO TABS: 81.0000 mg | ORAL_TABLET | Freq: Every day | ORAL | Status: DC

## 2013-03-15 NOTE — Progress Notes (Signed)
Pre visit review using our clinic review tool, if applicable. No additional management support is needed unless otherwise documented below in the visit note. 

## 2013-03-15 NOTE — Progress Notes (Signed)
OFFICE NOTE  03/15/2013  CC:  Chief Complaint  Patient presents with  . Follow-up     HPI: Patient is a 48 y.o. Caucasian male who is here for 4 mo f/u DM 2 and GERD. Fastings 85-105, 2H PP 140s or less.   He went to eye MD and his eyes have improved a lot. Prilosec too expensive/insurance problems: taking zantac usually once per day and says this is adequate.   He has not done any dietary changes.  Has had 2 wks of nasal congestion, runny nose, HA, pressure and pain in facial region.  No cough or fever. No ST.  Claritin D helpful short term.  Pertinent PMH:  Past Medical History  Diagnosis Date  . Hypothyroidism   . Back pain     s/p back surgery  . Migraines   . Tobacco dependence   . PLANTAR WART, RIGHT 10/09/2009  . Diabetes mellitus, type 2 08/2012--dx'd  . Neuropathic pain of right lower extremity   . History of low back pain 06/24/2011    MEDS:  Outpatient Prescriptions Prior to Visit  Medication Sig Dispense Refill  . amitriptyline (ELAVIL) 25 MG tablet 1 tab po qAM and 2 tabs po qhs  90 tablet  6  . glimepiride (AMARYL) 2 MG tablet Take 1 tablet (2 mg total) by mouth daily with breakfast.  30 tablet  6  . glucagon 1 MG injection Inject 1 mg IM prn hypoglycemic emergency  1 each  3  . Insulin Detemir (LEVEMIR FLEXTOUCH) 100 UNIT/ML SOPN 10 U sQ qhs  3 mL  6  . lansoprazole (PREVACID SOLUTAB) 30 MG disintegrating tablet Take 1 tablet (30 mg total) by mouth daily.  30 tablet  6  . metFORMIN (GLUCOPHAGE) 1000 MG tablet Take 1 tablet (1,000 mg total) by mouth 2 (two) times daily with a meal.  60 tablet  6  . SYNTHROID 112 MCG tablet TAKE 1 TABLET BY MOUTH EVERY DAY  30 tablet  3  . Blood Glucose Monitoring Suppl (ONE TOUCH ULTRA SYSTEM KIT) W/DEVICE KIT Use as directed to monitor glucose  1 each  0  . glucose blood (FREESTYLE INSULINX TEST) test strip Use as instructed  50 each  6  . ibuprofen (ADVIL,MOTRIN) 200 MG tablet Take 200 mg by mouth every 6 (six) hours as  needed.      . Insulin Pen Needle 32G X 6 MM MISC Use with levemir injection once nightly and also with meal-time insulin (novolog or humalog) up to 3 times per day  50 each  1  . Lancets (FREESTYLE) lancets Use as instructed  100 each  6  . traMADol (ULTRAM) 50 MG tablet Take 1 tablet (50 mg total) by mouth every 8 (eight) hours as needed for pain.  40 tablet  3   No facility-administered medications prior to visit.    PE: Blood pressure 138/95, pulse 94, temperature 99 F (37.2 C), temperature source Temporal, resp. rate 18, height $RemoveBe'5\' 5"'OCHyWUukN$  (1.651 m), weight 157 lb (71.215 kg), SpO2 100.00%. VS: noted--normal. Gen: alert, NAD, NONTOXIC APPEARING. HEENT: eyes without injection, drainage, or swelling.  Ears: EACs clear, TMs with normal light reflex and landmarks.  Nose: Clear rhinorrhea, with some dried, crusty exudate adherent to mildly injected mucosa.  No purulent d/c.  No paranasal sinus TTP.  No facial swelling.  Throat and mouth without focal lesion.  No pharyngial swelling, erythema, or exudate.   Neck: supple, no LAD.   LUNGS: CTA bilat, nonlabored resps.  CV: RRR, no m/r/g. EXT: no c/c/e SKIN: no rash  Labs: none today  IMPRESSION AND PLAN:  1) DM 2, control MUCH improved on current regimen. Check HbA1c today. Pneumovax today IM. Recent eye exam MUCH IMPROVED. Fasting cholesterol today. I recommended he start a daily 23m ASA today.  2) Acute sinusitis: azithromycin x 5d today. Nasal saline rinses discussed.  May continue claritin D prn.  3) Hypothyroidism: stable. Check annual TSH today.  4) GERD: improved/stable on daily OTC H2 blocker.  GERD diet encouraged.   FOLLOW UP: 4 mo for CPE

## 2013-03-15 NOTE — Addendum Note (Signed)
Addended by: Eulah PontALBRIGHT, Johathan Province M on: 03/15/2013 08:47 AM   Modules accepted: Orders

## 2013-03-16 MED ORDER — ATORVASTATIN CALCIUM 20 MG PO TABS: 20.0000 mg | ORAL_TABLET | Freq: Every day | ORAL | Status: DC

## 2013-03-16 NOTE — Addendum Note (Signed)
Addended by: Eulah PontALBRIGHT, LISA M on: 03/16/2013 04:44 PM   Modules accepted: Orders

## 2013-03-18 ENCOUNTER — Telehealth: Payer: Self-pay

## 2013-03-18 NOTE — Telephone Encounter (Signed)
Relevant patient education assigned to patient using Emmi. ° °

## 2013-03-24 ENCOUNTER — Telehealth: Payer: Self-pay | Admitting: Family Medicine

## 2013-03-24 NOTE — Telephone Encounter (Signed)
Patient's wife called stating that the levemir is now going to cost them $359/ month.  Please advise if there is an alternate insulin he can try.

## 2013-03-25 NOTE — Telephone Encounter (Signed)
We have discount/co-pay cards I'm pretty sure. We should look and make sure before I go changing his insulin.-thx

## 2013-03-29 NOTE — Telephone Encounter (Signed)
Spoke to patient's wife and she is going to fill out patient assistance forms and also take rx copay card to pharmacy to see if they qualify for any discounts.

## 2013-04-24 ENCOUNTER — Other Ambulatory Visit: Payer: Self-pay | Admitting: Family Medicine

## 2013-04-25 ENCOUNTER — Other Ambulatory Visit: Payer: Self-pay | Admitting: Family Medicine

## 2013-05-10 ENCOUNTER — Ambulatory Visit (INDEPENDENT_AMBULATORY_CARE_PROVIDER_SITE_OTHER): Payer: 59 | Admitting: Family Medicine

## 2013-05-10 ENCOUNTER — Encounter: Payer: Self-pay | Admitting: Family Medicine

## 2013-05-10 VITALS — BP 141/90 | HR 77 | Temp 97.1°F | Resp 16 | Ht 65.0 in | Wt 154.0 lb

## 2013-05-10 DIAGNOSIS — K219 Gastro-esophageal reflux disease without esophagitis: Secondary | ICD-10-CM

## 2013-05-10 DIAGNOSIS — R112 Nausea with vomiting, unspecified: Secondary | ICD-10-CM

## 2013-05-10 DIAGNOSIS — R197 Diarrhea, unspecified: Secondary | ICD-10-CM

## 2013-05-10 LAB — COMPREHENSIVE METABOLIC PANEL
ALT: 8 U/L (ref 0–53)
AST: 12 U/L (ref 0–37)
Albumin: 4.3 g/dL (ref 3.5–5.2)
Alkaline Phosphatase: 107 U/L (ref 39–117)
BILIRUBIN TOTAL: 0.3 mg/dL (ref 0.3–1.2)
BUN: 10 mg/dL (ref 6–23)
CO2: 28 mEq/L (ref 19–32)
CREATININE: 0.8 mg/dL (ref 0.4–1.5)
Calcium: 9.2 mg/dL (ref 8.4–10.5)
Chloride: 89 mEq/L — ABNORMAL LOW (ref 96–112)
GFR: 104.95 mL/min (ref >60.00)
Glucose, Bld: 89 mg/dL (ref 70–99)
Potassium: 4.1 mEq/L (ref 3.5–5.1)
SODIUM: 127 meq/L — AB (ref 135–145)
TOTAL PROTEIN: 7.8 g/dL (ref 6.0–8.3)

## 2013-05-10 LAB — H. PYLORI ANTIBODY, IGG: H Pylori IgG: NEGATIVE

## 2013-05-10 MED ORDER — PROMETHAZINE HCL 25 MG PO TABS: ORAL_TABLET | ORAL | Status: DC

## 2013-05-10 MED ORDER — PANTOPRAZOLE SODIUM 40 MG PO TBEC: 40.0000 mg | DELAYED_RELEASE_TABLET | Freq: Every day | ORAL | Status: DC

## 2013-05-10 NOTE — Progress Notes (Signed)
Pre visit review using our clinic review tool, if applicable. No additional management support is needed unless otherwise documented below in the visit note. 

## 2013-05-10 NOTE — Progress Notes (Signed)
OFFICE NOTE  05/10/2013  CC:  Chief Complaint  Patient presents with  . Nausea  . Diarrhea  . Emesis     HPI: Patient is a 48 y.o. Caucasian male who is here for n/v/d. About once a week for the last 4 weeks he has had morning burping, progresses to nausea and vomiting for approx 44mn and then he seems to recover for the rest of the day.  Drinking water helps, pepto helps.  No abd pain.  Also about a month of loose BMs every time he goes to have a BM--usually 3-4 per day.  He has noted no pus or blood in stool.  No known fevers.  No abd pain. CBGs have been normal.  He does not make a connection between ingestion of any certain food and his GI sx's.  The loose BMs usually follow eating by a couple of hours.  No known sick contacts.  No recent antibiotics. No camping or foreign travel.  He has well water.  He drinks no alcohol.  Pertinent PMH:  Past medical, surgical, social, and family history reviewed and no changes are noted since last office visit.  MEDS:  Not taking tramadol. Takes zantac 1544mqd (not prevacid as listed below)  Outpatient Prescriptions Prior to Visit  Medication Sig Dispense Refill  . amitriptyline (ELAVIL) 25 MG tablet TAKE 1 TABLET BY MOUTH IN THE MORNING & TAKE 2 TABLETS BY MOUTH AT BEDTIME  90 tablet  1  . aspirin 81 MG tablet Take 1 tablet (81 mg total) by mouth daily.  30 tablet  0  . atorvastatin (LIPITOR) 20 MG tablet Take 1 tablet (20 mg total) by mouth daily.  30 tablet  6  . glimepiride (AMARYL) 2 MG tablet Take 1 tablet (2 mg total) by mouth daily with breakfast.  30 tablet  6  . ibuprofen (ADVIL,MOTRIN) 200 MG tablet Take 200 mg by mouth every 6 (six) hours as needed.      . Insulin Detemir (LEVEMIR FLEXTOUCH) 100 UNIT/ML SOPN 10 U sQ qhs  3 mL  6  . lansoprazole (PREVACID SOLUTAB) 30 MG disintegrating tablet Take 1 tablet (30 mg total) by mouth daily.  30 tablet  6  . metFORMIN (GLUCOPHAGE) 1000 MG tablet TAKE 1 TABLET (1,000 MG TOTAL) BY  MOUTH 2 (TWO) TIMES DAILY WITH A MEAL.  60 tablet  1  . SYNTHROID 112 MCG tablet TAKE 1 TABLET BY MOUTH EVERY DAY  30 tablet  3  . Blood Glucose Monitoring Suppl (ONE TOUCH ULTRA SYSTEM KIT) W/DEVICE KIT Use as directed to monitor glucose  1 each  0  . glucagon 1 MG injection Inject 1 mg IM prn hypoglycemic emergency  1 each  3  . glucose blood (FREESTYLE INSULINX TEST) test strip Use as instructed  50 each  6  . Insulin Pen Needle 32G X 6 MM MISC Use with levemir injection once nightly and also with meal-time insulin (novolog or humalog) up to 3 times per day  50 each  1  . Lancets (FREESTYLE) lancets Use as instructed  100 each  6  . traMADol (ULTRAM) 50 MG tablet Take 1 tablet (50 mg total) by mouth every 8 (eight) hours as needed for pain.  40 tablet  3  . azithromycin (ZITHROMAX) 250 MG tablet 2 tabs po qd x 1d, then 1 tab po qd x 4d  6 each  0   No facility-administered medications prior to visit.    PE: Blood pressure 141/90,  pulse 77, temperature 97.1 F (36.2 C), temperature source Temporal, resp. rate 16, height 5' 5" (1.651 m), weight 154 lb (69.854 kg), SpO2 100.00%. Gen: Alert, well appearing.  Patient is oriented to person, place, time, and situation. ENT: Ears: EACs clear, normal epithelium.  TMs with good light reflex and landmarks bilaterally.  Eyes: no injection, icteris, swelling, or exudate.  EOMI, PERRLA. Nose: no drainage or turbinate edema/swelling.  No injection or focal lesion.  Mouth: lips without lesion/swelling.  Oral mucosa pink and moist.  Dentition intact and without obvious caries or gingival swelling.  Oropharynx without erythema, exudate, or swelling.  CV: RRR, no m/r/g.   LUNGS: CTA bilat, nonlabored resps, good aeration in all lung fields. ABD: just a hint of subxyphoid TTP, otherwise nontender.  No distention.  BS normal.  No HSM or bruit or mass. EXT: no clubbing, cyanosis, or edema.  Skin - no sores or suspicious lesions or rashes or color changes  LAB:  none today   IMPRESSION AND PLAN:  1) Episodic morning nauasea/vomiting, unclear etiology.  Possible severe GERD. No sign that this is related to biliary dz, also does not sound like diabetic gastroparesis. Will check CBC w/diff, CMET, H. Pylori ab. Start daily pantoprazole 40m qd and may stop zantac. Phenergan 270m 1/2-1 tab q6h prn.  2) Diarrhea: 1 mo duration.  Seems to be maintaining wt ok. Check stool culture, giardia/crypto, fecal lactoferrin, and c diff pcr. No antidiarrheal med recommended at this time.  An After Visit Summary was printed and given to the patient.  FOLLOW UP: To be determined based on results of pending workup.

## 2013-05-11 LAB — CBC WITH DIFFERENTIAL/PLATELET
BASOS ABS: 0 10*3/uL (ref 0.0–0.1)
Basophils Relative: 0.4 % (ref 0.0–3.0)
Eosinophils Absolute: 0.8 10*3/uL — ABNORMAL HIGH (ref 0.0–0.7)
Eosinophils Relative: 5.9 % — ABNORMAL HIGH (ref 0.0–5.0)
HCT: 42.3 % (ref 39.0–52.0)
Hemoglobin: 14.1 g/dL (ref 13.0–17.0)
LYMPHS ABS: 2.8 10*3/uL (ref 0.7–4.0)
Lymphocytes Relative: 21.4 % (ref 12.0–46.0)
MCHC: 33.2 g/dL (ref 30.0–36.0)
MCV: 92.2 fl (ref 78.0–100.0)
MONO ABS: 0.7 10*3/uL (ref 0.1–1.0)
Monocytes Relative: 5.4 % (ref 3.0–12.0)
Neutro Abs: 8.9 10*3/uL — ABNORMAL HIGH (ref 1.4–7.7)
Neutrophils Relative %: 66.9 % (ref 43.0–77.0)
PLATELETS: 284 10*3/uL (ref 150.0–400.0)
RBC: 4.58 Mil/uL (ref 4.22–5.81)
RDW: 13.6 % (ref 11.5–14.6)
WBC: 13.2 10*3/uL — ABNORMAL HIGH (ref 4.5–10.5)

## 2013-05-25 ENCOUNTER — Other Ambulatory Visit (INDEPENDENT_AMBULATORY_CARE_PROVIDER_SITE_OTHER): Payer: 59

## 2013-05-25 DIAGNOSIS — E871 Hypo-osmolality and hyponatremia: Secondary | ICD-10-CM

## 2013-05-28 LAB — COMPREHENSIVE METABOLIC PANEL
ALBUMIN: 4.3 g/dL (ref 3.5–5.2)
ALK PHOS: 100 U/L (ref 39–117)
ALT: 10 U/L (ref 0–53)
AST: 22 U/L (ref 0–37)
BUN: 18 mg/dL (ref 6–23)
CO2: 28 meq/L (ref 19–32)
Calcium: 9.8 mg/dL (ref 8.4–10.5)
Chloride: 91 mEq/L — ABNORMAL LOW (ref 96–112)
Creatinine, Ser: 1 mg/dL (ref 0.4–1.5)
GFR: 84.63 mL/min (ref >60.00)
Glucose, Bld: 84 mg/dL (ref 70–99)
POTASSIUM: 5.1 meq/L (ref 3.5–5.1)
SODIUM: 130 meq/L — AB (ref 135–145)
TOTAL PROTEIN: 7.8 g/dL (ref 6.0–8.3)
Total Bilirubin: 0.6 mg/dL (ref 0.3–1.2)

## 2013-06-20 ENCOUNTER — Other Ambulatory Visit: Payer: Self-pay | Admitting: Family Medicine

## 2013-07-01 ENCOUNTER — Other Ambulatory Visit: Payer: Self-pay | Admitting: Family Medicine

## 2013-07-02 ENCOUNTER — Other Ambulatory Visit: Payer: Self-pay | Admitting: Family Medicine

## 2013-07-12 ENCOUNTER — Other Ambulatory Visit: Payer: Self-pay | Admitting: Family Medicine

## 2013-07-13 ENCOUNTER — Encounter: Payer: Self-pay | Admitting: Family Medicine

## 2013-07-13 ENCOUNTER — Ambulatory Visit (INDEPENDENT_AMBULATORY_CARE_PROVIDER_SITE_OTHER): Payer: 59 | Admitting: Family Medicine

## 2013-07-13 VITALS — BP 137/91 | HR 83 | Temp 98.4°F | Resp 18 | Ht 65.0 in | Wt 153.0 lb

## 2013-07-13 DIAGNOSIS — E785 Hyperlipidemia, unspecified: Secondary | ICD-10-CM

## 2013-07-13 DIAGNOSIS — Z0000 Encounter for general adult medical examination without abnormal findings: Secondary | ICD-10-CM | POA: Insufficient documentation

## 2013-07-13 DIAGNOSIS — E119 Type 2 diabetes mellitus without complications: Secondary | ICD-10-CM

## 2013-07-13 DIAGNOSIS — R112 Nausea with vomiting, unspecified: Secondary | ICD-10-CM

## 2013-07-13 DIAGNOSIS — E871 Hypo-osmolality and hyponatremia: Secondary | ICD-10-CM

## 2013-07-13 LAB — BASIC METABOLIC PANEL
BUN: 12 mg/dL (ref 6–23)
CHLORIDE: 90 meq/L — AB (ref 96–112)
CO2: 31 mEq/L (ref 19–32)
Calcium: 9.4 mg/dL (ref 8.4–10.5)
Creatinine, Ser: 0.8 mg/dL (ref 0.4–1.5)
GFR: 106.36 mL/min (ref >60.00)
Glucose, Bld: 128 mg/dL — ABNORMAL HIGH (ref 70–99)
POTASSIUM: 4.6 meq/L (ref 3.5–5.1)
Sodium: 129 mEq/L — ABNORMAL LOW (ref 135–145)

## 2013-07-13 LAB — LIPID PANEL
CHOLESTEROL: 154 mg/dL (ref 0–200)
HDL: 40.9 mg/dL (ref >39.00)
LDL CALC: 99 mg/dL (ref 0–99)
NonHDL: 113.1
TRIGLYCERIDES: 70 mg/dL (ref 0.0–149.0)
Total CHOL/HDL Ratio: 4
VLDL: 14 mg/dL (ref 0.0–40.0)

## 2013-07-13 LAB — HEMOGLOBIN A1C: HEMOGLOBIN A1C: 6.5 % (ref 4.6–6.5)

## 2013-07-13 MED ORDER — ATORVASTATIN CALCIUM 20 MG PO TABS: 20.0000 mg | ORAL_TABLET | Freq: Every day | ORAL | Status: DC

## 2013-07-13 NOTE — Assessment & Plan Note (Addendum)
Reviewed age and gender appropriate health maintenance issues (prudent diet, regular exercise, health risks of tobacco and excessive alcohol, use of seatbelts, fire alarms in home, use of sunscreen).  Also reviewed age and gender appropriate health screening as well as vaccine recommendations. Repeat FLP, BMET, and HbA1c today. Continue current glucose monitoring. Pt instructions: Take synthroid w/out other meds and take on empty stomach. Take amaryl and metformin with some food.

## 2013-07-13 NOTE — Progress Notes (Signed)
Pre visit review using our clinic review tool, if applicable. No additional management support is needed unless otherwise documented below in the visit note. 

## 2013-07-13 NOTE — Progress Notes (Signed)
Office Note 07/13/2013  CC:  Chief Complaint  Patient presents with  . Annual Exam    HPI:  Daniel Stevenson is a 48 y.o. White male who is here for CPE. Fasting gluc 110 avg. 2H PP 150-180. Still with nausea with emesis x 1 a couple of mornings a week, w/out abd pain or diarrhea. Comes before a meal--? Takes amaryl too early and then waits too long to eat?   Says it doesn't seem to be linked with what he has eaten the night before.  Takes PPI daily. Walks most days, works in yard.  Still trying to cut back on cigs--hasn't totally quit yet.   Past Medical History  Diagnosis Date  . Hypothyroidism   . Back pain     s/p back surgery  . Migraines   . Tobacco dependence   . PLANTAR WART, RIGHT 10/09/2009  . Diabetes mellitus, type 2 08/2012--dx'd  . Neuropathic pain of right lower extremity   . History of low back pain 06/24/2011    Past Surgical History  Procedure Laterality Date  . Back surgery  2009    lower lumbar fusion- rubber disc in back  . Nasal sinus surgery  2007    Family History  Problem Relation Age of Onset  . Emphysema Mother     smoker  . Obesity Father   No FH of colon or prostate cancer  History   Social History  . Marital Status: Married    Spouse Name: N/A    Number of Children: N/A  . Years of Education: N/A   Occupational History  . Not on file.   Social History Main Topics  . Smoking status: Former Smoker -- 0.50 packs/day for 27 years    Types: Cigarettes    Quit date: 12/19/2010  . Smokeless tobacco: Never Used  . Alcohol Use: Yes     Comment: rarely  . Drug Use: No  . Sexual Activity: No   Other Topics Concern  . Not on file   Social History Narrative   Married, 1 biologic child, 3 step children (all grown).   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   Smoker: 1-2 ppd x 17 yrs.  Rare ETOH, no drugs.   No exercise.             Outpatient Prescriptions Prior to Visit  Medication Sig Dispense  Refill  . amitriptyline (ELAVIL) 25 MG tablet TAKE 1 TABLET BY MOUTH IN THE MORNING & TAKE 2 TABLETS BY MOUTH AT BEDTIME  90 tablet  1  . aspirin 81 MG tablet Take 1 tablet (81 mg total) by mouth daily.  30 tablet  0  . Blood Glucose Monitoring Suppl (ONE TOUCH ULTRA SYSTEM KIT) W/DEVICE KIT Use as directed to monitor glucose  1 each  0  . glimepiride (AMARYL) 2 MG tablet Take 1 tablet (2 mg total) by mouth daily with breakfast.  30 tablet  6  . glucose blood (FREESTYLE INSULINX TEST) test strip Use as instructed  50 each  6  . ibuprofen (ADVIL,MOTRIN) 200 MG tablet Take 200 mg by mouth every 6 (six) hours as needed.      . Insulin Detemir (LEVEMIR FLEXTOUCH) 100 UNIT/ML SOPN 10 U sQ qhs  3 mL  6  . Insulin Pen Needle 32G X 6 MM MISC Use with levemir injection once nightly and also with meal-time insulin (novolog or humalog) up to 3 times per day  50 each  1  .  Lancets (FREESTYLE) lancets Use as instructed  100 each  6  . metFORMIN (GLUCOPHAGE) 1000 MG tablet TAKE 1 TABLET (1,000 MG TOTAL) BY MOUTH 2 (TWO) TIMES DAILY WITH A MEAL.  60 tablet  1  . pantoprazole (PROTONIX) 40 MG tablet Take 1 tablet (40 mg total) by mouth daily. 1 tab po qAM prior to breakfast  30 tablet  6  . promethazine (PHENERGAN) 25 MG tablet 1/2-1 tab po q6h prn nausea  20 tablet  1  . SYNTHROID 112 MCG tablet TAKE 1 TABLET BY MOUTH EVERY DAY  30 tablet  3  . amitriptyline (ELAVIL) 25 MG tablet TAKE 1 TABLET BY MOUTH IN THE MORNING & TAKE 2 TABLETS BY MOUTH AT BEDTIME  90 tablet  1  . atorvastatin (LIPITOR) 20 MG tablet Take 1 tablet (20 mg total) by mouth daily.  30 tablet  6  . glimepiride (AMARYL) 2 MG tablet TAKE 1 TABLET BY MOUTH EVERY DAY BEFORE BREAKFAST  30 tablet  1  . metFORMIN (GLUCOPHAGE) 1000 MG tablet TAKE 1 TABLET (1,000 MG TOTAL) BY MOUTH 2 (TWO) TIMES DAILY WITH A MEAL.  60 tablet  1  . glucagon 1 MG injection Inject 1 mg IM prn hypoglycemic emergency  1 each  3   No facility-administered medications prior to  visit.    Allergies  Allergen Reactions  . Oxycodone-Acetaminophen     REACTION: itching    ROS Review of Systems  Constitutional: Negative for fever, chills, appetite change and fatigue.  HENT: Negative for congestion, dental problem, ear pain and sore throat.   Eyes: Negative for discharge, redness and visual disturbance.  Respiratory: Negative for cough, chest tightness, shortness of breath and wheezing.   Cardiovascular: Negative for chest pain, palpitations and leg swelling.  Gastrointestinal: Negative for nausea, vomiting, abdominal pain, diarrhea and blood in stool.  Genitourinary: Negative for dysuria, urgency, frequency, hematuria, flank pain and difficulty urinating.  Musculoskeletal: Negative for arthralgias, back pain, joint swelling, myalgias and neck stiffness.  Skin: Negative for pallor and rash.  Neurological: Negative for dizziness, speech difficulty, weakness and headaches.  Hematological: Negative for adenopathy. Does not bruise/bleed easily.  Psychiatric/Behavioral: Negative for confusion and sleep disturbance. The patient is not nervous/anxious.     PE; Blood pressure 137/91, pulse 83, temperature 98.4 F (36.9 C), temperature source Temporal, resp. rate 18, height $RemoveBe'5\' 5"'xbNascCBK$  (1.651 m), weight 153 lb (69.4 kg), SpO2 99.00%. Gen: Alert, well appearing.  Patient is oriented to person, place, time, and situation. AFFECT: pleasant, lucid thought and speech. ENT: Ears: EACs clear, normal epithelium.  TMs with good light reflex and landmarks bilaterally.  Eyes: no injection, icteris, swelling, or exudate.  EOMI, PERRLA. Nose: no drainage or turbinate edema/swelling.  No injection or focal lesion.  Mouth: lips without lesion/swelling.  Oral mucosa pink and moist.  He has upper and lower dentures.  Oropharynx without erythema, exudate, or swelling.  Neck: supple/nontender.  No LAD, mass, or TM.  Carotid pulses 2+ bilaterally, without bruits. CV: RRR, no m/r/g.   LUNGS: CTA  bilat, nonlabored resps, good aeration in all lung fields. ABD: soft, NT, ND, BS normal.  No hepatospenomegaly or mass.  No bruits. EXT: no clubbing, cyanosis, or edema.  Musculoskeletal: no joint swelling, erythema, warmth, or tenderness.  ROM of all joints intact. Skin - no sores or suspicious lesions or rashes or color changes GU and DRE deferred  Pertinent labs:  None today  ASSESSMENT AND PLAN:   Health maintenance examination Reviewed age  and gender appropriate health maintenance issues (prudent diet, regular exercise, health risks of tobacco and excessive alcohol, use of seatbelts, fire alarms in home, use of sunscreen).  Also reviewed age and gender appropriate health screening as well as vaccine recommendations. Repeat FLP, BMET, and HbA1c today. Continue current glucose monitoring. Pt instructions: Take synthroid w/out other meds and take on empty stomach. Take amaryl and metformin with some food.  Pt uses a denture cream/glue that contains zinc most of the time---he asks for zinc level to be checked to see if this could be related to his occ n/v in mornings.  I added this lab today.  An After Visit Summary was printed and given to the patient.  FOLLOW UP:  Return in about 6 months (around 01/12/2014) for routine chronic illness f/u.

## 2013-07-13 NOTE — Patient Instructions (Signed)
Take synthroid w/out other meds and take on empty stomach. Take amaryl and metformin with some food.

## 2013-07-15 ENCOUNTER — Other Ambulatory Visit: Payer: Self-pay | Admitting: Family Medicine

## 2013-07-17 LAB — ZINC: Zinc: 103 ug/dL (ref 60–130)

## 2013-08-15 ENCOUNTER — Other Ambulatory Visit: Payer: Self-pay | Admitting: Family Medicine

## 2013-08-18 ENCOUNTER — Other Ambulatory Visit: Payer: Self-pay | Admitting: Family Medicine

## 2013-08-19 ENCOUNTER — Other Ambulatory Visit: Payer: Self-pay | Admitting: Family Medicine

## 2013-08-19 MED ORDER — GLIMEPIRIDE 2 MG PO TABS: ORAL_TABLET | ORAL | Status: DC

## 2013-09-06 ENCOUNTER — Ambulatory Visit (INDEPENDENT_AMBULATORY_CARE_PROVIDER_SITE_OTHER): Payer: 59 | Admitting: Family Medicine

## 2013-09-06 ENCOUNTER — Encounter: Payer: Self-pay | Admitting: Family Medicine

## 2013-09-06 VITALS — BP 132/83 | HR 92 | Temp 98.6°F | Resp 18 | Ht 65.0 in | Wt 154.0 lb

## 2013-09-06 DIAGNOSIS — Z789 Other specified health status: Secondary | ICD-10-CM | POA: Insufficient documentation

## 2013-09-06 DIAGNOSIS — Z888 Allergy status to other drugs, medicaments and biological substances status: Secondary | ICD-10-CM

## 2013-09-06 DIAGNOSIS — E119 Type 2 diabetes mellitus without complications: Secondary | ICD-10-CM

## 2013-09-06 LAB — GLUCOSE, POCT (MANUAL RESULT ENTRY): POC GLUCOSE: 293 mg/dL — AB (ref 70–99)

## 2013-09-06 MED ORDER — GLIMEPIRIDE 4 MG PO TABS: 4.0000 mg | ORAL_TABLET | Freq: Every day | ORAL | Status: DC

## 2013-09-06 MED ORDER — AMITRIPTYLINE HCL 25 MG PO TABS: ORAL_TABLET | ORAL | Status: DC

## 2013-09-06 MED ORDER — PIOGLITAZONE HCL 15 MG PO TABS: 15.0000 mg | ORAL_TABLET | Freq: Every day | ORAL | Status: DC

## 2013-09-06 NOTE — Progress Notes (Signed)
Pre visit review using our clinic review tool, if applicable. No additional management support is needed unless otherwise documented below in the visit note. 

## 2013-09-06 NOTE — Progress Notes (Signed)
OFFICE NOTE  09/06/2013  CC:  Chief Complaint  Patient presents with  . Follow-up    not fasting   HPI: Patient is a 48 y.o. Caucasian male who is here for discussion of meds and GI complaints. He connects all his recurrent n/v episodes with taking his metformin.   Stopped metformin 3 d/a and has not had n/v/heartburn/loose stools sx's and has not required PPI. However, his sugars are much higher off this med and he has had to restart levemir and mealtime insulin.  Taking 5-10 U mealtime insulin last few days and 10 U levemir last few days since d/c metformin. He did not require insulin while taking metformin.  Pertinent PMH:  Past surgical, social, and family history reviewed and no changes noted since last office visit.  MEDS:  Outpatient Prescriptions Prior to Visit  Medication Sig Dispense Refill  . amitriptyline (ELAVIL) 25 MG tablet TAKE 1 TABLET BY MOUTH IN THE MORNING & TAKE 2 TABLETS BY MOUTH AT BEDTIME  90 tablet  1  . aspirin 81 MG tablet Take 1 tablet (81 mg total) by mouth daily.  30 tablet  0  . atorvastatin (LIPITOR) 20 MG tablet Take 1 tablet (20 mg total) by mouth daily.  30 tablet  6  . glimepiride (AMARYL) 2 MG tablet Take 1 tablet (2 mg total) by mouth daily with breakfast.  30 tablet  6  . Insulin Detemir (LEVEMIR FLEXTOUCH) 100 UNIT/ML SOPN 10 U sQ qhs  3 mL  6  . pantoprazole (PROTONIX) 40 MG tablet Take 1 tablet (40 mg total) by mouth daily. 1 tab po qAM prior to breakfast  30 tablet  6  . promethazine (PHENERGAN) 25 MG tablet TAKE 1/2-1 TABLET BY MOUTH EVERY 6 HOURS AS NEEDED FOR NAUSEA  20 tablet  1  . SYNTHROID 112 MCG tablet TAKE 1 TABLET BY MOUTH EVERY DAY  30 tablet  3  . Blood Glucose Monitoring Suppl (ONE TOUCH ULTRA SYSTEM KIT) W/DEVICE KIT Use as directed to monitor glucose  1 each  0  . glucagon 1 MG injection Inject 1 mg IM prn hypoglycemic emergency  1 each  3  . glucose blood (FREESTYLE INSULINX TEST) test strip Use as instructed  50 each  6  .  ibuprofen (ADVIL,MOTRIN) 200 MG tablet Take 200 mg by mouth every 6 (six) hours as needed.      . Insulin Pen Needle 32G X 6 MM MISC Use with levemir injection once nightly and also with meal-time insulin (novolog or humalog) up to 3 times per day  50 each  1  . Lancets (FREESTYLE) lancets Use as instructed  100 each  6  . metFORMIN (GLUCOPHAGE) 1000 MG tablet TAKE 1 TABLET (1,000 MG TOTAL) BY MOUTH 2 (TWO) TIMES DAILY WITH A MEAL.  60 tablet  1  . glimepiride (AMARYL) 2 MG tablet TAKE 1 TABLET BY MOUTH EVERY DAY BEFORE BREAKFAST  30 tablet  3  . glimepiride (AMARYL) 2 MG tablet TAKE 1 TABLET BY MOUTH EVERY DAY BEFORE BREAKFAST  30 tablet  3   No facility-administered medications prior to visit.   LABS: cbg today (not fasting): 293.  PE: Blood pressure 132/83, pulse 92, temperature 98.6 F (37 C), temperature source Temporal, resp. rate 18, height $RemoveBe'5\' 5"'TCVKjdUnt$  (1.651 m), weight 154 lb (69.854 kg), SpO2 97.00%. Gen: Alert, well appearing.  Patient is oriented to person, place, time, and situation. AFFECT: pleasant, lucid thought and speech. No further exam today.  LAB: cbg  293 (no mealtime insulin today and NOT fasting)  IMPRESSION AND PLAN:  DM 2, recent poor control due to intolerance to metformin/discontinuation of this med. Will add actos $RemoveB'15mg'IBLXWoRt$  qd and hopefully titrate up to 45 mg over the next months. Increase glimepiride to $RemoveBefore'4mg'RzIwfccBJzSMp$   Po qAM.  Continue levemir at 5 U qhs.  No mealtime insulin for now.  An After Visit Summary was printed and given to the patient.  FOLLOW UP: 6 wks

## 2013-09-06 NOTE — Patient Instructions (Signed)
Take 15mg  Actos (pioglitazone) every morning. Take 4 mg glimepiride every morning. Take 5 U levemir every night.  No meal time insulin for now.

## 2013-09-08 ENCOUNTER — Ambulatory Visit: Payer: 59 | Admitting: Family Medicine

## 2013-09-17 ENCOUNTER — Other Ambulatory Visit: Payer: Self-pay | Admitting: Family Medicine

## 2013-09-17 MED ORDER — GLIMEPIRIDE 4 MG PO TABS: 8.0000 mg | ORAL_TABLET | Freq: Every day | ORAL | Status: DC

## 2013-09-17 MED ORDER — PIOGLITAZONE HCL 45 MG PO TABS: 45.0000 mg | ORAL_TABLET | Freq: Every day | ORAL | Status: DC

## 2013-09-17 NOTE — Progress Notes (Signed)
Glucoses dropped off today for review. Fasting range 129-194, pre lunch range 114-288, 2H after lunch range 268-371, presupper range 300-400. Leg hurting, as it usually does when sugars poorly controlled.  I called him and advised him to double up on his 15mg  actos until he runs out, then fill rx for 45mg  actos to take qd. I also recommended he increase his amaryl to 8mg  po qAM, and increase his levemir to 10 U qhs. No mealtime insulin at this time but if gluc's remain too high this will be the next step (+ d/c amaryl). He'll call in approx 1 wk with report of sugar trends with these changes.

## 2013-10-15 ENCOUNTER — Other Ambulatory Visit: Payer: Self-pay | Admitting: Family Medicine

## 2013-11-03 ENCOUNTER — Ambulatory Visit: Payer: 59 | Admitting: Family Medicine

## 2013-11-04 ENCOUNTER — Ambulatory Visit (INDEPENDENT_AMBULATORY_CARE_PROVIDER_SITE_OTHER): Payer: 59 | Admitting: Family Medicine

## 2013-11-04 ENCOUNTER — Encounter: Payer: Self-pay | Admitting: Family Medicine

## 2013-11-04 VITALS — BP 136/82 | HR 81 | Temp 98.0°F | Wt 153.8 lb

## 2013-11-04 DIAGNOSIS — E785 Hyperlipidemia, unspecified: Secondary | ICD-10-CM

## 2013-11-04 DIAGNOSIS — E1165 Type 2 diabetes mellitus with hyperglycemia: Principal | ICD-10-CM

## 2013-11-04 DIAGNOSIS — E039 Hypothyroidism, unspecified: Secondary | ICD-10-CM

## 2013-11-04 DIAGNOSIS — IMO0001 Reserved for inherently not codable concepts without codable children: Secondary | ICD-10-CM

## 2013-11-04 MED ORDER — SYNTHROID 112 MCG PO TABS: ORAL_TABLET | ORAL | Status: DC

## 2013-11-04 MED ORDER — HYDROCODONE-HOMATROPINE 5-1.5 MG/5ML PO SYRP: ORAL_SOLUTION | ORAL | Status: DC

## 2013-11-04 MED ORDER — GLIMEPIRIDE 4 MG PO TABS: 8.0000 mg | ORAL_TABLET | Freq: Every day | ORAL | Status: DC

## 2013-11-04 NOTE — Progress Notes (Signed)
Pre visit review using our clinic review tool, if applicable. No additional management support is needed unless otherwise documented below in the visit note. 

## 2013-11-04 NOTE — Progress Notes (Signed)
OFFICE NOTE  11/04/2013  CC: URI sx's, routine chronic illness f/u  HPI: Patient is a 48 y.o. Caucasian male who is here for 2 mo f/u DM 2, hypothyroidism, recent URI sx's.  Onset 2 d/a coughing, prod of greenish/yellow phlegm, some nasal cong/PND, little ST, +HA (periorbital).  Slight tightness in chest, slight wheeze. Alka seltzer cold day/night tabs help periodically.  No fevers. No n/v/d.  Glucoses: fasting typically 120-130s. Afternoons range 250-280.   Not taking any levemir--they report insurer prefers lantus (for future reference).  He says he takes glimeperide $RemoveBeforeDEI'4mg'gKayUZXHViHuTmSP$  qAM but was doing better on 8 mg qAM but a RF SNAFU occurred and he has only been taking it at $Rem'4mg'rsyw$  qd dosing, with $RemoveBefo'45mg'VxHHTDYtSmJ$  actos.  Pertinent PMH:  Past medical, surgical, social, and family history reviewed and no changes are noted since last office visit.  MEDS:  Outpatient Prescriptions Prior to Visit  Medication Sig Dispense Refill  . amitriptyline (ELAVIL) 25 MG tablet TAKE 1 TABLET BY MOUTH IN THE MORNING & TAKE 2 TABLETS BY MOUTH AT BEDTIME  90 tablet  11  . aspirin 81 MG tablet Take 1 tablet (81 mg total) by mouth daily.  30 tablet  0  . atorvastatin (LIPITOR) 20 MG tablet Take 1 tablet (20 mg total) by mouth daily.  30 tablet  6  . Blood Glucose Monitoring Suppl (ONE TOUCH ULTRA SYSTEM KIT) W/DEVICE KIT Use as directed to monitor glucose  1 each  0  . glimepiride (AMARYL) 4 MG tablet Take 2 tablets (8 mg total) by mouth daily with breakfast.  60 tablet  11  . glimepiride (AMARYL) 4 MG tablet TAKE 1 TABLET (4 MG TOTAL) BY MOUTH DAILY WITH BREAKFAST.  30 tablet  1  . glucagon 1 MG injection Inject 1 mg IM prn hypoglycemic emergency  1 each  3  . glucose blood (FREESTYLE INSULINX TEST) test strip Use as instructed  50 each  6  . ibuprofen (ADVIL,MOTRIN) 200 MG tablet Take 200 mg by mouth every 6 (six) hours as needed.      . Insulin Detemir (LEVEMIR FLEXTOUCH) 100 UNIT/ML SOPN 10 U sQ qhs  3 mL  6  . Insulin Pen  Needle 32G X 6 MM MISC Use with levemir injection once nightly and also with meal-time insulin (novolog or humalog) up to 3 times per day  50 each  1  . Lancets (FREESTYLE) lancets Use as instructed  100 each  6  . pantoprazole (PROTONIX) 40 MG tablet Take 1 tablet (40 mg total) by mouth daily. 1 tab po qAM prior to breakfast  30 tablet  6  . pioglitazone (ACTOS) 45 MG tablet Take 1 tablet (45 mg total) by mouth daily.  30 tablet  11  . promethazine (PHENERGAN) 25 MG tablet TAKE 1/2-1 TABLET BY MOUTH EVERY 6 HOURS AS NEEDED FOR NAUSEA  20 tablet  1  . SYNTHROID 112 MCG tablet TAKE 1 TABLET BY MOUTH EVERY DAY  30 tablet  3   No facility-administered medications prior to visit.    PE: Blood pressure 136/82, pulse 81, temperature 98 F (36.7 C), temperature source Oral, weight 153 lb 12.8 oz (69.763 kg), SpO2 99.00%. VS: noted--normal. Gen: alert, NAD, NONTOXIC APPEARING. HEENT: eyes without injection, drainage, or swelling.  Ears: EACs clear, TMs with normal light reflex and landmarks.  Nose: Clear rhinorrhea, with some dried, crusty exudate adherent to mildly injected mucosa.  No purulent d/c.  No paranasal sinus TTP.  No facial swelling.  Throat and mouth without focal lesion.  No pharyngial swelling, erythema, or exudate.   Neck: supple, no LAD.   LUNGS: CTA bilat, nonlabored resps.   CV: RRR, no m/r/g. EXT: no c/c/e SKIN: no rash  LAB:  Lab Results  Component Value Date   CHOL 154 07/13/2013   HDL 40.90 07/13/2013   LDLCALC 99 07/13/2013   TRIG 70.0 07/13/2013   CHOLHDL 4 07/13/2013     IMPRESSION AND PLAN:  1) DM 2, poor control recently. For now, increase glimeperide to $RemoveBefore'8mg'liYhIdeibyzmj$  po qd b/c he had better daytime glucoses when he was on this dosing briefly. Continue actos $RemoveBefore'45mg'fOnGeulAodGGS$  qd.  May need to restart insulin. Check HbA1c and urine microalb/cr and CMET today.  2) Viral URI, no sign of bronchitis at this time.  Symptomatic care discussed. Tobacco dependence: encouraged cessation.  3)  Hypothyroidism: RF synthroid. TSH monitoring next f/u in 4 mo.  4) Hyperlipidemia: lipids good 07/2013, tolerating statin.  An After Visit Summary was printed and given to the patient.  FOLLOW UP: 4 mo

## 2013-11-05 LAB — COMPREHENSIVE METABOLIC PANEL
ALBUMIN: 4 g/dL (ref 3.5–5.2)
ALT: 25 U/L (ref 0–53)
AST: 25 U/L (ref 0–37)
Alkaline Phosphatase: 104 U/L (ref 39–117)
BILIRUBIN TOTAL: 0.6 mg/dL (ref 0.2–1.2)
BUN: 14 mg/dL (ref 6–23)
CO2: 26 mEq/L (ref 19–32)
Calcium: 9.3 mg/dL (ref 8.4–10.5)
Chloride: 89 mEq/L — ABNORMAL LOW (ref 96–112)
Creatinine, Ser: 1.2 mg/dL (ref 0.4–1.5)
GFR: 71.18 mL/min (ref >60.00)
GLUCOSE: 175 mg/dL — AB (ref 70–99)
POTASSIUM: 4.2 meq/L (ref 3.5–5.1)
SODIUM: 123 meq/L — AB (ref 135–145)
TOTAL PROTEIN: 7 g/dL (ref 6.0–8.3)

## 2013-11-05 LAB — HEMOGLOBIN A1C: Hgb A1c MFr Bld: 9 % — ABNORMAL HIGH (ref 4.6–6.5)

## 2013-11-05 LAB — MICROALBUMIN / CREATININE URINE RATIO
Creatinine,U: 181.8 mg/dL
Microalb Creat Ratio: 1.3 mg/g (ref 0.0–30.0)
Microalb, Ur: 2.4 mg/dL — ABNORMAL HIGH (ref 0.0–1.9)

## 2013-11-08 ENCOUNTER — Telehealth: Payer: Self-pay

## 2013-11-08 MED ORDER — INSULIN PEN NEEDLE 32G X 4 MM MISC: Status: DC

## 2013-11-08 NOTE — Telephone Encounter (Signed)
rx request for bd ultra-fine pen needles 42mmX32G, use qid prn, qty 100 w/ 3 rfs.  Rx done and sent to pharmacy

## 2014-01-12 ENCOUNTER — Ambulatory Visit: Payer: 59 | Admitting: Family Medicine

## 2014-03-07 ENCOUNTER — Ambulatory Visit (INDEPENDENT_AMBULATORY_CARE_PROVIDER_SITE_OTHER): Payer: 59 | Admitting: Family Medicine

## 2014-03-07 ENCOUNTER — Encounter: Payer: Self-pay | Admitting: Family Medicine

## 2014-03-07 VITALS — BP 147/94 | HR 97 | Temp 98.4°F | Ht 65.0 in | Wt 157.0 lb

## 2014-03-07 DIAGNOSIS — Z720 Tobacco use: Secondary | ICD-10-CM

## 2014-03-07 DIAGNOSIS — E039 Hypothyroidism, unspecified: Secondary | ICD-10-CM

## 2014-03-07 DIAGNOSIS — R072 Precordial pain: Secondary | ICD-10-CM

## 2014-03-07 DIAGNOSIS — E119 Type 2 diabetes mellitus without complications: Secondary | ICD-10-CM

## 2014-03-07 DIAGNOSIS — E871 Hypo-osmolality and hyponatremia: Secondary | ICD-10-CM

## 2014-03-07 DIAGNOSIS — E785 Hyperlipidemia, unspecified: Secondary | ICD-10-CM

## 2014-03-07 MED ORDER — INSULIN ASPART 100 UNIT/ML FLEXPEN
PEN_INJECTOR | SUBCUTANEOUS | Status: DC
Start: 2014-03-07 — End: 2014-03-21

## 2014-03-07 MED ORDER — ATORVASTATIN CALCIUM 20 MG PO TABS: 20.0000 mg | ORAL_TABLET | Freq: Every day | ORAL | Status: DC

## 2014-03-07 MED ORDER — INSULIN GLARGINE 100 UNIT/ML SOLOSTAR PEN: PEN_INJECTOR | SUBCUTANEOUS | Status: DC

## 2014-03-07 NOTE — Progress Notes (Signed)
Pre visit review using our clinic review tool, if applicable. No additional management support is needed unless otherwise documented below in the visit note. 

## 2014-03-07 NOTE — Progress Notes (Addendum)
OFFICE NOTE  03/07/2014  CC:  Chief Complaint  Patient presents with  . Follow-up   HPI: Patient is a 49 y.o. Caucasian male who is here for 4 mo f/u DM 2, hypothyroidism, hyperlipidemia. Giving himself 10 U lantus qhs, taking mealtime insulin only at supper usually and this is 5 U usually. Morning and mid day mealtimes he doesn't take insulin. Fasting gluc 90 avg (pt says his meter's 90d avg is 165).  Has been taking atorvastatin but ran out last week.  No side effects felt from this med. Takes synthroid 112 mcg qAM separate from anything else.  No burning, tingling, or numbness on feet.  No hx of ulcer.  Acute complaint: about 6 mo hx of intermittent achiness and pressure in center of chest, sharp character but not burning.  No SOB, no nausea, no diaphoresis, no palpitations.  It occurs more in afternoon when he gets relaxed, evenings as well after supper.  It occurs at rest just as much as it occurs when he is active.  Nothing makes it better, nothing makes it worse.  It is always the same intensity, occurs an average of 3-4 days a week.  It does happen more when he works than when he is not working or if he is on vacation. It does seem to happen more often on heavier/busier work days.  Denies any chronilogic relation to meals/eating.  Pertinent PMH:  Past medical, surgical, social, and family history reviewed and no changes are noted since last office visit.  MEDS:  Outpatient Prescriptions Prior to Visit  Medication Sig Dispense Refill  . amitriptyline (ELAVIL) 25 MG tablet TAKE 1 TABLET BY MOUTH IN THE MORNING & TAKE 2 TABLETS BY MOUTH AT BEDTIME 90 tablet 11  . aspirin 81 MG tablet Take 1 tablet (81 mg total) by mouth daily. 30 tablet 0  . Blood Glucose Monitoring Suppl (ONE TOUCH ULTRA SYSTEM KIT) W/DEVICE KIT Use as directed to monitor glucose 1 each 0  . glucose blood (FREESTYLE INSULINX TEST) test strip Use as instructed 50 each 6  . ibuprofen (ADVIL,MOTRIN) 200 MG tablet  Take 200 mg by mouth every 6 (six) hours as needed.    . Insulin Pen Needle (INSUPEN PEN NEEDLES) 32G X 4 MM MISC (BD Ultra-fine needles) Use 4 times daily as needed.  Dx:250.02 100 each 3  . Lancets (FREESTYLE) lancets Use as instructed 100 each 6  . SYNTHROID 112 MCG tablet TAKE 1 TABLET BY MOUTH EVERY DAY 30 tablet 6  . atorvastatin (LIPITOR) 20 MG tablet Take 1 tablet (20 mg total) by mouth daily. (Patient not taking: Reported on 03/07/2014) 30 tablet 6  . glimepiride (AMARYL) 4 MG tablet Take 2 tablets (8 mg total) by mouth daily with breakfast. (Patient not taking: Reported on 03/07/2014) 60 tablet 11  . glucagon 1 MG injection Inject 1 mg IM prn hypoglycemic emergency (Patient not taking: Reported on 03/07/2014) 1 each 3  . HYDROcodone-homatropine (HYCODAN) 5-1.5 MG/5ML syrup 1-2 tsp po qhs prn cough (Patient not taking: Reported on 03/07/2014) 120 mL 0  . pantoprazole (PROTONIX) 40 MG tablet Take 1 tablet (40 mg total) by mouth daily. 1 tab po qAM prior to breakfast (Patient not taking: Reported on 03/07/2014) 30 tablet 6  . pioglitazone (ACTOS) 45 MG tablet Take 1 tablet (45 mg total) by mouth daily. (Patient not taking: Reported on 03/07/2014) 30 tablet 11  . promethazine (PHENERGAN) 25 MG tablet TAKE 1/2-1 TABLET BY MOUTH EVERY 6 HOURS AS NEEDED FOR  NAUSEA (Patient not taking: Reported on 03/07/2014) 20 tablet 1   No facility-administered medications prior to visit.    PE: Blood pressure 147/94, pulse 97, temperature 98.4 F (36.9 C), temperature source Temporal, height $RemoveBeforeDE'5\' 5"'PybUKUpeYdsdHiE$  (1.651 m), weight 157 lb (71.215 kg), SpO2 100 %. Gen: Alert, well appearing.  Patient is oriented to person, place, time, and situation. HYQ:MVHQ: no injection, icteris, swelling, or exudate.  EOMI, PERRLA. Mouth: lips without lesion/swelling.  Oral mucosa pink and moist. Oropharynx without erythema, exudate, or swelling.  Neck - No masses or thyromegaly or limitation in range of motion CV: RRR, no m/r/g.   LUNGS:  CTA bilat, nonlabored resps, good aeration in all lung fields. EXT: no clubbing, cyanosis, or edema.  Foot exam -  no swelling, tenderness or skin or vascular lesions. Color and temperature is normal. Sensation is intact. Peripheral pulses are palpable. Toenails are thickened and several on left foot are long and have subungual white matter.  Diffuse dryness/cracking of skin is noted.  No calluses.  No ulcerations.  LAB: 12 lead EKG today: NSR, rate 85, isolated TWI in V1, poor R wave progression.   Low voltage aVL.  Otherwise all normal. No prior EKG for comparison.   RECENT:  Lab Results  Component Value Date   HGBA1C 9.0* 11/04/2013     Chemistry      Component Value Date/Time   NA 123* 11/04/2013 1602   K 4.2 11/04/2013 1602   CL 89* 11/04/2013 1602   CO2 26 11/04/2013 1602   BUN 14 11/04/2013 1602   CREATININE 1.2 11/04/2013 1602   CREATININE 0.71 06/27/2011 1516      Component Value Date/Time   CALCIUM 9.3 11/04/2013 1602   ALKPHOS 104 11/04/2013 1602   AST 25 11/04/2013 1602   ALT 25 11/04/2013 1602   BILITOT 0.6 11/04/2013 1602     Lab Results  Component Value Date   CHOL 154 07/13/2013   HDL 40.90 07/13/2013   LDLCALC 99 07/13/2013   TRIG 70.0 07/13/2013   CHOLHDL 4 07/13/2013   Lab Results  Component Value Date   TSH 2.25 03/15/2013   IMPRESSION AND PLAN:  1) Precordial chest pain, most consistent with chest wall pain. However, pt with several RF's for CAD (DM 2, HLD, tob abuse) so I would like to further risk stratify him with a myocardial perfusion stress test (exercise).  Continue ASA $RemoveBefo'81mg'CZgVLObHUsW$  qd. I would like him to restart his daily PPI in case this is esophageal irritation pain--discussed this today and he'll wait to try this after the stress test if stress test is neg/low risk.  2) DM 2, control improved according to home monitoring. Continue current insulin regimen, recheck A1c.  Labs ordered future/[facility] b/c of our staffing issues leaving [place] w/out  ability to do blood draws today. Feet normal today except excessive dryness: encourage aggressive moisturization. Encouraged pt to get his diab retpathy screening exam sometime in the next few months.  3) Hypothyroidism: due for TSH recheck: ordered.  4) Hx of hyponatremia: Sept 2015, while on actos.  I stopped this med and we'll recheck BMET now.  5) Hyperlipidemia: tolerating statin.  Recheck FLP 07/2014.  An After Visit Summary was printed and given to the patient.  FOLLOW UP: 60mo

## 2014-03-08 ENCOUNTER — Ambulatory Visit (HOSPITAL_BASED_OUTPATIENT_CLINIC_OR_DEPARTMENT_OTHER)
Admission: RE | Admit: 2014-03-08 | Discharge: 2014-03-08 | Disposition: A | Discharge status: Home / Self Care | Payer: 59 | Source: Ambulatory Visit | Attending: Family Medicine | Admitting: Family Medicine

## 2014-03-08 ENCOUNTER — Other Ambulatory Visit: Payer: Self-pay | Admitting: Family Medicine

## 2014-03-08 DIAGNOSIS — Z87891 Personal history of nicotine dependence: Secondary | ICD-10-CM | POA: Diagnosis not present

## 2014-03-08 DIAGNOSIS — R072 Precordial pain: Secondary | ICD-10-CM | POA: Diagnosis present

## 2014-03-12 LAB — ZINC: Zinc: 59 ug/dL — ABNORMAL LOW (ref 60–130)

## 2014-03-18 ENCOUNTER — Ambulatory Visit (HOSPITAL_COMMUNITY): Payer: 59 | Attending: Cardiology | Admitting: Radiology

## 2014-03-18 DIAGNOSIS — E119 Type 2 diabetes mellitus without complications: Secondary | ICD-10-CM | POA: Insufficient documentation

## 2014-03-18 DIAGNOSIS — F17200 Nicotine dependence, unspecified, uncomplicated: Secondary | ICD-10-CM | POA: Diagnosis not present

## 2014-03-18 DIAGNOSIS — R072 Precordial pain: Secondary | ICD-10-CM | POA: Diagnosis not present

## 2014-03-18 DIAGNOSIS — E785 Hyperlipidemia, unspecified: Secondary | ICD-10-CM | POA: Diagnosis not present

## 2014-03-18 DIAGNOSIS — Z720 Tobacco use: Secondary | ICD-10-CM

## 2014-03-18 MED ORDER — TECHNETIUM TC 99M SESTAMIBI GENERIC - CARDIOLITE
33.0000 | Freq: Once | INTRAVENOUS | Status: AC | PRN
  Administered 2014-03-18: 33 via INTRAVENOUS

## 2014-03-18 MED ORDER — TECHNETIUM TC 99M SESTAMIBI GENERIC - CARDIOLITE
11.0000 | Freq: Once | INTRAVENOUS | Status: AC | PRN
  Administered 2014-03-18: 11 via INTRAVENOUS

## 2014-03-18 NOTE — Progress Notes (Signed)
[facility] SITE 3 NUCLEAR MED [address] , [place] [address] [phone]    Cardiology Nuclear Med Study  Mayer MaskerRobert M Stevenson is a 49 y.o. male     MRN : [id number]     DOB: 1965/09/05  Procedure Date: 03/18/2014  Nuclear Med Background Indication for Stress Test:  Evaluation for Ischemia History:  No Cardiac History Cardiac Risk Factors: IDDM Type 2  Symptoms:  Chest Pain   Nuclear Pre-Procedure Caffeine/Decaff Intake:  None NPO After: 5 pm   Lungs:  clear O2 Sat: 97% on room air. IV 0.9% NS with Angio Cath:  22g  IV Site: R Hand  IV Started by:  Bonnita LevanJackie Smith, RN  Chest Size (in):  42 Cup Size: n/a  Height: 5\' 5"  (1.651 m)  Weight:  152 lb (68.947 kg)  BMI:  Body mass index is 25.29 kg/(m^2). Tech Comments:  N/A    Nuclear Med Study 1 or 2 day study: 1 day  Stress Test Type:  Stress  Reading MD: N/A Order Authorizing Provider: Nicoletta BaPhilip McGowen, MD  Resting Radionuclide: Technetium 2277m Sestamibi  Resting Radionuclide Dose: 11.0 mCi   Stress Radionuclide:  Technetium 3477m Sestamibi  Stress Radionuclide Dose: 33.0 mCi           Stress Protocol Rest HR: 86 Stress HR: 166  Rest BP: 119/79 Stress BP: 174/85  Exercise Time (min): 9:00 METS: 10.1   Predicted Max HR: 171 bpm % Max HR: 97.08 bpm Rate Pressure Product: [id number]   Dose of Adenosine (mg):  n/a Dose of Lexiscan: n/a mg  Dose of Atropine (mg): n/a Dose of Dobutamine: n/a mcg/kg/min (at max HR)  Stress Test Technologist: Bonnita LevanJackie Smith, RN  Nuclear Technologist:  Kerby NoraElzbieta Kubak, CNMT     Rest Procedure:  Myocardial perfusion imaging was performed at rest 45 minutes following the intravenous administration of Technetium 5777m Sestamibi. Rest ECG: NSR - Normal EKG  Stress Procedure:  The patient exercised on the treadmill utilizing the Bruce Protocol for 9:00 minutes. The patient stopped due to dyspnea and leg fatigue.  Technetium 1777m Sestamibi was injected at peak exercise and myocardial perfusion  imaging was performed after a brief delay. Stress ECG: No significant ST segment change suggestive of ischemia.  QPS Raw Data Images:  Acquisition technically good; normal left ventricular size. Stress Images:  Normal homogeneous uptake in all areas of the myocardium. Rest Images:  Normal homogeneous uptake in all areas of the myocardium. Subtraction (SDS):  No evidence of ischemia. Transient Ischemic Dilatation (Normal <1.22):  0.93 Lung/Heart Ratio (Normal <0.45):  0.28  Quantitative Gated Spect Images QGS EDV:  91 ml QGS ESV:  46 ml  Impression Exercise Capacity:  Good exercise capacity. BP Response:  Normal blood pressure response. Clinical Symptoms:  Patient complained of chest pain in recovery ECG Impression:  No significant ST segment change suggestive of ischemia. Comparison with Prior Nuclear Study: No images to compare  Overall Impression:  Low risk stress nuclear study with no evidence of ischemia or infarction..  LV Ejection Fraction: 49%.  LV Wall Motion:  Mild global hypokinesis; suggest echocardiogram to further assess LV function.   Olga MillersBrian Crenshaw

## 2014-03-21 ENCOUNTER — Encounter: Payer: Self-pay | Admitting: Family Medicine

## 2014-03-21 ENCOUNTER — Other Ambulatory Visit (INDEPENDENT_AMBULATORY_CARE_PROVIDER_SITE_OTHER): Payer: 59

## 2014-03-21 ENCOUNTER — Other Ambulatory Visit: Payer: Self-pay | Admitting: Family Medicine

## 2014-03-21 DIAGNOSIS — E119 Type 2 diabetes mellitus without complications: Secondary | ICD-10-CM

## 2014-03-21 DIAGNOSIS — E039 Hypothyroidism, unspecified: Secondary | ICD-10-CM

## 2014-03-21 DIAGNOSIS — I519 Heart disease, unspecified: Secondary | ICD-10-CM

## 2014-03-21 HISTORY — PX: CARDIOVASCULAR STRESS TEST: SHX262

## 2014-03-21 LAB — BASIC METABOLIC PANEL
BUN: 17 mg/dL (ref 6–23)
CALCIUM: 9.7 mg/dL (ref 8.4–10.5)
CHLORIDE: 97 meq/L (ref 96–112)
CO2: 29 mEq/L (ref 19–32)
CREATININE: 1.02 mg/dL (ref 0.40–1.50)
GFR: 82.44 mL/min (ref >60.00)
Glucose, Bld: 141 mg/dL — ABNORMAL HIGH (ref 70–99)
Potassium: 4.4 mEq/L (ref 3.5–5.1)
SODIUM: 132 meq/L — AB (ref 135–145)

## 2014-03-21 LAB — TSH: TSH: 14.54 u[IU]/mL — ABNORMAL HIGH (ref 0.35–4.50)

## 2014-03-21 LAB — HEMOGLOBIN A1C: Hgb A1c MFr Bld: 8.2 % — ABNORMAL HIGH (ref 4.6–6.5)

## 2014-03-21 MED ORDER — INSULIN LISPRO 100 UNIT/ML (KWIKPEN): PEN_INJECTOR | SUBCUTANEOUS | Status: DC

## 2014-03-22 ENCOUNTER — Other Ambulatory Visit: Payer: Self-pay | Admitting: Family Medicine

## 2014-03-22 MED ORDER — LEVOTHYROXINE SODIUM 125 MCG PO TABS: 125.0000 ug | ORAL_TABLET | Freq: Every day | ORAL | Status: DC

## 2014-03-24 ENCOUNTER — Encounter (HOSPITAL_COMMUNITY): Payer: Self-pay

## 2014-03-24 ENCOUNTER — Ambulatory Visit (HOSPITAL_COMMUNITY): Payer: 59 | Attending: Cardiology

## 2014-03-24 ENCOUNTER — Encounter: Payer: Self-pay | Admitting: Family Medicine

## 2014-03-24 DIAGNOSIS — I519 Heart disease, unspecified: Secondary | ICD-10-CM | POA: Diagnosis present

## 2014-03-24 HISTORY — PX: TRANSTHORACIC ECHOCARDIOGRAM: SHX275

## 2014-03-24 NOTE — Progress Notes (Signed)
2D Echo completed. 03/24/2014 

## 2014-03-31 ENCOUNTER — Telehealth: Payer: Self-pay | Admitting: Family Medicine

## 2014-03-31 NOTE — Telephone Encounter (Signed)
Noted  

## 2014-03-31 NOTE — Telephone Encounter (Signed)
Follow up  - Talked with [facility] in regards to pts. Zinc lab order. Lab person read order to me and verified [facility]. It was a single order. She has no way of verifing who physically sent the order.  No other information was available.Dwaine Deter/DH

## 2014-07-06 ENCOUNTER — Encounter: Payer: Self-pay | Admitting: Family Medicine

## 2014-07-06 ENCOUNTER — Ambulatory Visit (INDEPENDENT_AMBULATORY_CARE_PROVIDER_SITE_OTHER): Payer: 59 | Admitting: Family Medicine

## 2014-07-06 VITALS — BP 121/82 | HR 80 | Temp 98.1°F | Resp 16 | Wt 158.0 lb

## 2014-07-06 DIAGNOSIS — E871 Hypo-osmolality and hyponatremia: Secondary | ICD-10-CM

## 2014-07-06 DIAGNOSIS — E039 Hypothyroidism, unspecified: Secondary | ICD-10-CM

## 2014-07-06 DIAGNOSIS — D72829 Elevated white blood cell count, unspecified: Secondary | ICD-10-CM

## 2014-07-06 DIAGNOSIS — E119 Type 2 diabetes mellitus without complications: Secondary | ICD-10-CM | POA: Diagnosis not present

## 2014-07-06 DIAGNOSIS — M67432 Ganglion, left wrist: Secondary | ICD-10-CM

## 2014-07-06 NOTE — Progress Notes (Signed)
OFFICE NOTE  07/11/2014  CC:  Chief Complaint  Patient presents with  . Follow-up    4 month f/u. Pt is not fasting.     HPI: Patient is a 49 y.o. Caucasian male who is here for 4 mo f/u DM 2, hypothyroidism, hyperlipidemia-he is not fasting today. Since last visit: stress test and echo wnl. TSH was up so L4 dose was increased to 125 mcg qd and he is due for recheck of TSH. Lantus and humalog were increased in response to A1c 8.2%.  Compliant with insulins and takes thyroid med correctly. Working hard on TLC lately (esp diet): glucoses fasting range 88-103. Pre-meal checks 80s-100s.  Some HAs hs lately, responds to excedrin and ice pack over forehead.  Says he drinks lots of water during day. No n/v or dizziness.  No excessive stress in his life lately: "everything's going well".  Has no dizziness, nausea, vision abnormalities, or photo/phono phobia.    Some smoking still but has cut back.  Says slight swelling at radial aspect of wrist has grown some lately, seems to be two to three separate swellings sometimes, occ is sore/bothersome when he moves wrist.   No pain/tingling/color changes in hand/fingers.   Pertinent PMH:  Past Medical History  Diagnosis Date  . Hypothyroidism   . Back pain     s/p back surgery  . Migraines   . Tobacco dependence in remission     quit 2012  . PLANTAR WART, RIGHT 10/09/2009  . Diabetes mellitus, type 2 08/2012--dx'd  . Neuropathic pain of right lower extremity   . History of low back pain 06/24/2011  . Hyperlipidemia   . Chronic hyponatremia 08/2012 onset    Onset was right after starting amitriptyline for his neuropathic leg pain--has been stable.  Likely ADH-like effect on kidney from amitriptyline.   Past Surgical History  Procedure Laterality Date  . Back surgery  2009    lower lumbar fusion- rubber disc in back  . Nasal sinus surgery  2007  . Cardiovascular stress test  03/21/14    No ischemia/low risk study; however, LV function  showed EF 49% and global LV hypokinesis--echo recommended and it showed normal LV fxn.  . Transthoracic echocardiogram  03/24/14    Normal LV fxn, EF 50-55%, no wall motion abnormalities: (grade 1 diast dysfxn)    MEDS:  Outpatient Prescriptions Prior to Visit  Medication Sig Dispense Refill  . amitriptyline (ELAVIL) 25 MG tablet TAKE 1 TABLET BY MOUTH IN THE MORNING & TAKE 2 TABLETS BY MOUTH AT BEDTIME 90 tablet 11  . aspirin 81 MG tablet Take 1 tablet (81 mg total) by mouth daily. 30 tablet 0  . atorvastatin (LIPITOR) 20 MG tablet Take 1 tablet (20 mg total) by mouth daily. 30 tablet 11  . Blood Glucose Monitoring Suppl (ONE TOUCH ULTRA SYSTEM KIT) W/DEVICE KIT Use as directed to monitor glucose 1 each 0  . glucose blood (FREESTYLE INSULINX TEST) test strip Use as instructed 50 each 6  . ibuprofen (ADVIL,MOTRIN) 200 MG tablet Take 200 mg by mouth every 6 (six) hours as needed.    . Insulin Glargine (LANTUS SOLOSTAR) 100 UNIT/ML Solostar Pen 10 U SQ qhs 5 pen 11  . insulin lispro (HUMALOG KWIKPEN) 100 UNIT/ML KiwkPen 5 U SQ qAC 15 mL 11  . Insulin Pen Needle (INSUPEN PEN NEEDLES) 32G X 4 MM MISC (BD Ultra-fine needles) Use 4 times daily as needed.  Dx:250.02 100 each 3  . Lancets (FREESTYLE) lancets Use  as instructed 100 each 6  . levothyroxine (SYNTHROID) 125 MCG tablet Take 1 tablet (125 mcg total) by mouth daily before breakfast. 30 tablet 2  . glucagon 1 MG injection Inject 1 mg IM prn hypoglycemic emergency (Patient not taking: Reported on 03/07/2014) 1 each 3  . HYDROcodone-homatropine (HYCODAN) 5-1.5 MG/5ML syrup 1-2 tsp po qhs prn cough (Patient not taking: Reported on 03/07/2014) 120 mL 0  . pantoprazole (PROTONIX) 40 MG tablet Take 1 tablet (40 mg total) by mouth daily. 1 tab po qAM prior to breakfast (Patient not taking: Reported on 03/07/2014) 30 tablet 6  . promethazine (PHENERGAN) 25 MG tablet TAKE 1/2-1 TABLET BY MOUTH EVERY 6 HOURS AS NEEDED FOR NAUSEA (Patient not taking: Reported  on 03/07/2014) 20 tablet 1   No facility-administered medications prior to visit.    PE: Blood pressure 121/82, pulse 80, temperature 98.1 F (36.7 C), temperature source Oral, resp. rate 16, weight 158 lb (71.668 kg), SpO2 98 %. Gen: Alert, well appearing.  Patient is oriented to person, place, time, and situation. VEL:FYBO: no injection, icteris, swelling, or exudate.  EOMI, PERRLA. Mouth: lips without lesion/swelling.  Oral mucosa pink and moist. Oropharynx without erythema, exudate, or swelling.  CV: RRR Chest is clear, no wheezing or rales. Normal symmetric air entry throughout both lung fields. No chest wall deformities or tenderness. EXT: no clubbing, cyanosis, or edema.  Left wrist, radial aspect near radial styloid he has 2 soft, fluctuant subQ nodules that are pulsatile. They likely directly overlie the radial artery.  These nodules are minimally TTP.  No erythema. Neuro: CN 2-12 intact bilaterally, strength 5/5 in proximal and distal upper extremities and lower extremities bilaterally. No tremor.    No ataxia.  Upper extremity and lower extremity DTRs symmetric.  No pronator drift.  LABS: Lab Results  Component Value Date   TSH 3.37 07/06/2014   Lab Results  Component Value Date   WBC 9.9 07/06/2014   HGB 15.1 07/06/2014   HCT 43.7 07/06/2014   MCV 90.5 07/06/2014   PLT 228.0 07/06/2014   Lab Results  Component Value Date   CREATININE 0.95 07/06/2014   BUN 15 07/06/2014   NA 129* 07/06/2014   K 4.5 07/06/2014   CL 93* 07/06/2014   CO2 31 07/06/2014   Lab Results  Component Value Date   ALT 25 11/04/2013   AST 25 11/04/2013   ALKPHOS 104 11/04/2013   BILITOT 0.6 11/04/2013   Lab Results  Component Value Date   CHOL 154 07/13/2013   Lab Results  Component Value Date   HDL 40.90 07/13/2013   Lab Results  Component Value Date   LDLCALC 99 07/13/2013   Lab Results  Component Value Date   TRIG 70.0 07/13/2013   Lab Results  Component Value Date    CHOLHDL 4 07/13/2013   Lab Results  Component Value Date   HGBA1C 7.2* 07/06/2014   IMPRESSION AND PLAN:  1) DM 2, control good in the past except for A1c at his last f/u. Repeat HbA1c today.  He has diab retpthy screening exam set up for sometime in the next couple months per his report today.  2) Hypothyroidism: last TSH up, needs recheck TSH today to see if dose adjustment was adequate.  3) Hx of mild hyponatremia (hyponatremic); asymptomatic (except possibly his headaches in the evening could be from this):  per his report of "drinking lots of water all day long" + his being on amitriptyline, I believe he has combo  of mild excessive free water intake in the presence of mildly impaired ability of his kidneys to put out a dilute urine (ADH-like effect of amitriptyline). Recheck BMET today.  May need urine Na/urine osmo to further eval if hyponatremia worsening.  4) Hx of leukocytosis: recheck CBC w/diff today.  5) Ganglion cyst of left wrist:  This feels a bit too soft and is too pulsatile for me to feel comfortable with the dx of ganglion cyst w/out further investigation: ordered art dopplers of left wrist to be done to help clarify.  If ganglion cyst after all, will refer his to orthopedist for consideration of excision per pt's preference at this time.  An After Visit Summary was printed and given to the patient.  FOLLOW UP: 4 mo

## 2014-07-06 NOTE — Progress Notes (Signed)
Pre visit review using our clinic review tool, if applicable. No additional management support is needed unless otherwise documented below in the visit note. 

## 2014-07-07 ENCOUNTER — Encounter: Payer: Self-pay | Admitting: Family Medicine

## 2014-07-07 LAB — BASIC METABOLIC PANEL
BUN: 15 mg/dL (ref 6–23)
CALCIUM: 9.5 mg/dL (ref 8.4–10.5)
CO2: 31 mEq/L (ref 19–32)
CREATININE: 0.95 mg/dL (ref 0.40–1.50)
Chloride: 93 mEq/L — ABNORMAL LOW (ref 96–112)
GFR: 89.38 mL/min (ref >60.00)
GLUCOSE: 74 mg/dL (ref 70–99)
POTASSIUM: 4.5 meq/L (ref 3.5–5.1)
Sodium: 129 mEq/L — ABNORMAL LOW (ref 135–145)

## 2014-07-07 LAB — CBC WITH DIFFERENTIAL/PLATELET
BASOS ABS: 0 10*3/uL (ref 0.0–0.1)
BASOS PCT: 0.5 % (ref 0.0–3.0)
EOS ABS: 0.5 10*3/uL (ref 0.0–0.7)
EOS PCT: 4.6 % (ref 0.0–5.0)
HEMATOCRIT: 43.7 % (ref 39.0–52.0)
HEMOGLOBIN: 15.1 g/dL (ref 13.0–17.0)
LYMPHS ABS: 3.6 10*3/uL (ref 0.7–4.0)
Lymphocytes Relative: 36.1 % (ref 12.0–46.0)
MCHC: 34.5 g/dL (ref 30.0–36.0)
MCV: 90.5 fl (ref 78.0–100.0)
Monocytes Absolute: 0.6 10*3/uL (ref 0.1–1.0)
Monocytes Relative: 6.1 % (ref 3.0–12.0)
Neutro Abs: 5.2 10*3/uL (ref 1.4–7.7)
Neutrophils Relative %: 52.7 % (ref 43.0–77.0)
PLATELETS: 228 10*3/uL (ref 150.0–400.0)
RBC: 4.83 Mil/uL (ref 4.22–5.81)
RDW: 13.6 % (ref 11.5–15.5)
WBC: 9.9 10*3/uL (ref 4.0–10.5)

## 2014-07-07 LAB — TSH: TSH: 3.37 u[IU]/mL (ref 0.35–4.50)

## 2014-07-07 LAB — HEMOGLOBIN A1C: Hgb A1c MFr Bld: 7.2 % — ABNORMAL HIGH (ref 4.6–6.5)

## 2014-07-07 MED ORDER — LEVOTHYROXINE SODIUM 125 MCG PO TABS: 125.0000 ug | ORAL_TABLET | Freq: Every day | ORAL | Status: DC

## 2014-07-18 ENCOUNTER — Other Ambulatory Visit: Payer: Self-pay | Admitting: *Deleted

## 2014-07-18 MED ORDER — INSULIN PEN NEEDLE 32G X 4 MM MISC: Status: DC

## 2014-09-07 ENCOUNTER — Other Ambulatory Visit: Payer: Self-pay | Admitting: *Deleted

## 2014-09-07 MED ORDER — AMITRIPTYLINE HCL 25 MG PO TABS: ORAL_TABLET | ORAL | Status: DC

## 2014-09-07 NOTE — Telephone Encounter (Signed)
RF request for amitriptyline LOV: 07/06/14 Next ov: 11/07/14 Last written: 09/06/13 #90 w/ 11RF

## 2014-11-07 ENCOUNTER — Ambulatory Visit: Payer: 59 | Admitting: Family Medicine

## 2014-11-23 ENCOUNTER — Encounter: Payer: Self-pay | Admitting: Family Medicine

## 2014-11-23 ENCOUNTER — Ambulatory Visit (INDEPENDENT_AMBULATORY_CARE_PROVIDER_SITE_OTHER): Payer: BLUE CROSS/BLUE SHIELD | Admitting: Family Medicine

## 2014-11-23 VITALS — BP 152/82 | HR 73 | Temp 98.3°F | Resp 16 | Ht 65.0 in | Wt 152.0 lb

## 2014-11-23 DIAGNOSIS — E119 Type 2 diabetes mellitus without complications: Secondary | ICD-10-CM | POA: Diagnosis not present

## 2014-11-23 DIAGNOSIS — E871 Hypo-osmolality and hyponatremia: Secondary | ICD-10-CM

## 2014-11-23 DIAGNOSIS — Z23 Encounter for immunization: Secondary | ICD-10-CM | POA: Diagnosis not present

## 2014-11-23 MED ORDER — AMITRIPTYLINE HCL 25 MG PO TABS: ORAL_TABLET | ORAL | Status: DC

## 2014-11-23 MED ORDER — LEVOTHYROXINE SODIUM 125 MCG PO TABS: 125.0000 ug | ORAL_TABLET | Freq: Every day | ORAL | Status: DC

## 2014-11-23 MED ORDER — INSULIN ASPART 100 UNIT/ML FLEXPEN: PEN_INJECTOR | SUBCUTANEOUS | Status: DC

## 2014-11-23 MED ORDER — INSULIN GLARGINE 100 UNIT/ML SOLOSTAR PEN: PEN_INJECTOR | SUBCUTANEOUS | Status: DC

## 2014-11-23 MED ORDER — ATORVASTATIN CALCIUM 20 MG PO TABS: 20.0000 mg | ORAL_TABLET | Freq: Every day | ORAL | Status: DC

## 2014-11-23 NOTE — Progress Notes (Signed)
Pre visit review using our clinic review tool, if applicable. No additional management support is needed unless otherwise documented below in the visit note. 

## 2014-11-23 NOTE — Progress Notes (Signed)
OFFICE VISIT  11/23/2014   CC:  Chief Complaint  Patient presents with  . Follow-up   HPI:    Patient is a 49 y.o. Caucasian male who presents for 4 mo f/u hypothyroidism, DM 2, and hyperlipidemia.  No bp monitoring at home.  Has never had significant high bp in office before. Denies HA or vision complaints.  Unfortunately, he switched jobs and was off of health insurance for a while. Was not taking amitrip or atorv for a few weeks.  RAn out of humalog 1 week ago. Was taking some old 112 mcg synthroid he had leftover instead of his 125 mcg, unclear how long.  Says he has been feeling great, no complaints, eating great diet and he had been able to get his lantus down to 6 U qhs and humalog to 3U qAC before running out of humalog.  Lately had to increase lantus back up to 14 U after running out of humalog.    ROS: no back or leg pains.  Sleeping fine.   Past Medical History  Diagnosis Date  . Hypothyroidism   . Back pain     s/p back surgery  . Migraines   . Tobacco dependence in remission     quit 2012  . PLANTAR WART, RIGHT 10/09/2009  . Diabetes mellitus, type 2 ([place]) 08/2012--dx'd  . Neuropathic pain of right lower extremity   . History of low back pain 06/24/2011  . Hyperlipidemia   . Chronic hyponatremia 08/2012 onset    Onset was right after starting amitriptyline for his neuropathic leg pain--has been stable.  Likely ADH-like effect on kidney from amitriptyline.    Past Surgical History  Procedure Laterality Date  . Back surgery  2009    lower lumbar fusion- rubber disc in back  . Nasal sinus surgery  2007  . Cardiovascular stress test  03/21/14    No ischemia/low risk study; however, LV function showed EF 49% and global LV hypokinesis--echo recommended and it showed normal LV fxn.  . Transthoracic echocardiogram  03/24/14    Normal LV fxn, EF 50-55%, no wall motion abnormalities: (grade 1 diast dysfxn)    Outpatient Prescriptions Prior to Visit  Medication Sig  Dispense Refill  . aspirin 81 MG tablet Take 1 tablet (81 mg total) by mouth daily. 30 tablet 0  . Blood Glucose Monitoring Suppl (ONE TOUCH ULTRA SYSTEM KIT) W/DEVICE KIT Use as directed to monitor glucose 1 each 0  . glucose blood (FREESTYLE INSULINX TEST) test strip Use as instructed 50 each 6  . ibuprofen (ADVIL,MOTRIN) 200 MG tablet Take 200 mg by mouth every 6 (six) hours as needed.    . Insulin Pen Needle (INSUPEN PEN NEEDLES) 32G X 4 MM MISC (BD Ultra-fine needles) Use 4 times daily as needed.  Dx:E11.65 100 each 11  . Lancets (FREESTYLE) lancets Use as instructed 100 each 6  . amitriptyline (ELAVIL) 25 MG tablet TAKE 1 TABLET BY MOUTH IN THE MORNING & TAKE 2 TABLETS BY MOUTH AT BEDTIME 90 tablet 3  . atorvastatin (LIPITOR) 20 MG tablet Take 1 tablet (20 mg total) by mouth daily. 30 tablet 11  . Insulin Glargine (LANTUS SOLOSTAR) 100 UNIT/ML Solostar Pen 10 U SQ qhs 5 pen 11  . insulin lispro (HUMALOG KWIKPEN) 100 UNIT/ML KiwkPen 5 U SQ qAC 15 mL 11  . levothyroxine (SYNTHROID) 125 MCG tablet Take 1 tablet (125 mcg total) by mouth daily before breakfast. 90 tablet 3  . glucagon 1 MG injection Inject 1  mg IM prn hypoglycemic emergency (Patient not taking: Reported on 11/23/2014) 1 each 3  . HYDROcodone-homatropine (HYCODAN) 5-1.5 MG/5ML syrup 1-2 tsp po qhs prn cough (Patient not taking: Reported on 03/07/2014) 120 mL 0  . pantoprazole (PROTONIX) 40 MG tablet Take 1 tablet (40 mg total) by mouth daily. 1 tab po qAM prior to breakfast (Patient not taking: Reported on 03/07/2014) 30 tablet 6  . promethazine (PHENERGAN) 25 MG tablet TAKE 1/2-1 TABLET BY MOUTH EVERY 6 HOURS AS NEEDED FOR NAUSEA (Patient not taking: Reported on 03/07/2014) 20 tablet 1   No facility-administered medications prior to visit.    Allergies  Allergen Reactions  . Oxycodone-Acetaminophen     REACTION: itching    ROS As per HPI  PE: Blood pressure 152/82, pulse 73, temperature 98.3 F (36.8 C), temperature  source Oral, resp. rate 16, height $RemoveBe'5\' 5"'QOuLHLgwB$  (1.651 m), weight 152 lb (68.947 kg), SpO2 98 %. BP 120/74 on recheck at end of visit. CV: RRR, no m/r/g.   LUNGS: CTA bilat, nonlabored resps, good aeration in all lung fields. EXT: no clubbing, cyanosis, or edema.   LABS:  Lab Results  Component Value Date   TSH 3.37 07/06/2014   Lab Results  Component Value Date   WBC 9.9 07/06/2014   HGB 15.1 07/06/2014   HCT 43.7 07/06/2014   MCV 90.5 07/06/2014   PLT 228.0 07/06/2014   Lab Results  Component Value Date   CREATININE 0.95 07/06/2014   BUN 15 07/06/2014   NA 129* 07/06/2014   K 4.5 07/06/2014   CL 93* 07/06/2014   CO2 31 07/06/2014   Lab Results  Component Value Date   ALT 25 11/04/2013   AST 25 11/04/2013   ALKPHOS 104 11/04/2013   BILITOT 0.6 11/04/2013   Lab Results  Component Value Date   CHOL 154 07/13/2013   Lab Results  Component Value Date   HDL 40.90 07/13/2013   Lab Results  Component Value Date   LDLCALC 99 07/13/2013   Lab Results  Component Value Date   TRIG 70.0 07/13/2013   Lab Results  Component Value Date   CHOLHDL 4 07/13/2013   Lab Results  Component Value Date   HGBA1C 7.2* 07/06/2014   IMPRESSION AND PLAN:  1) DM 2: check a1c today. Continue lantus, restart Novolog (insurance coverage --he knows that humalog is NOT on their tier system, but novolog is, otherwise he does not have any specific info about what meds are preferred).  2) Hyperlipidemia: lipids good 4 mo ago, as were his AST/ALT. Restart atorva $RemoveBefore'20mg'fYOKcdaiQlcBs$  qd. No lipid check today.  3) Hypothyroidism: restart 125 mcg synthroid qd.  No TSH check today since I don't have a good grasp on his avg daily dose lately.  Last TSH done on 125 mcg qd dosing was good, so we know this dose is a good one for him.  4) Chronic hyponatremia: I suspect this is secondary to amitriptyline use.  He has been off this med for about 2 wks, so I'll see what his Na is today on BMET.  Restart amitriptyline  as per prior dosing.  Flu vaccine given today.  FOLLOW UP: Return in about 4 months (around 03/26/2015) for routine chronic illness f/u.

## 2014-11-24 LAB — BASIC METABOLIC PANEL
BUN: 15 mg/dL (ref 6–23)
CHLORIDE: 91 meq/L — AB (ref 96–112)
CO2: 31 meq/L (ref 19–32)
CREATININE: 0.94 mg/dL (ref 0.40–1.50)
Calcium: 9.5 mg/dL (ref 8.4–10.5)
GFR: 90.34 mL/min (ref >60.00)
Glucose, Bld: 264 mg/dL — ABNORMAL HIGH (ref 70–99)
Potassium: 5.2 mEq/L — ABNORMAL HIGH (ref 3.5–5.1)
Sodium: 127 mEq/L — ABNORMAL LOW (ref 135–145)

## 2014-11-24 LAB — HEMOGLOBIN A1C: Hgb A1c MFr Bld: 7.5 % — ABNORMAL HIGH (ref 4.6–6.5)

## 2014-12-26 ENCOUNTER — Other Ambulatory Visit: Payer: Self-pay | Admitting: *Deleted

## 2014-12-26 MED ORDER — ATORVASTATIN CALCIUM 20 MG PO TABS: 20.0000 mg | ORAL_TABLET | Freq: Every day | ORAL | Status: DC

## 2014-12-26 NOTE — Telephone Encounter (Signed)
RF request for atorvastatin - requesting 90 day supply LOV: 11/23/14 Next ov: 03/17/15 Last written: 11/23/14 #30 w/ 11RF

## 2015-01-10 ENCOUNTER — Encounter: Payer: Self-pay | Admitting: Family Medicine

## 2015-01-10 ENCOUNTER — Ambulatory Visit (INDEPENDENT_AMBULATORY_CARE_PROVIDER_SITE_OTHER): Payer: BLUE CROSS/BLUE SHIELD | Admitting: Family Medicine

## 2015-01-10 VITALS — BP 121/80 | HR 86 | Temp 97.7°F | Resp 18 | Ht 65.0 in | Wt 149.0 lb

## 2015-01-10 DIAGNOSIS — R51 Headache: Secondary | ICD-10-CM

## 2015-01-10 DIAGNOSIS — R5383 Other fatigue: Secondary | ICD-10-CM

## 2015-01-10 DIAGNOSIS — R519 Headache, unspecified: Secondary | ICD-10-CM

## 2015-01-10 DIAGNOSIS — J309 Allergic rhinitis, unspecified: Secondary | ICD-10-CM | POA: Diagnosis not present

## 2015-01-10 LAB — CBC WITH DIFFERENTIAL/PLATELET
BASOS ABS: 0.1 10*3/uL (ref 0.0–0.1)
Basophils Relative: 0.5 % (ref 0.0–3.0)
EOS PCT: 1.7 % (ref 0.0–5.0)
Eosinophils Absolute: 0.2 10*3/uL (ref 0.0–0.7)
HCT: 44.7 % (ref 39.0–52.0)
HEMOGLOBIN: 15.1 g/dL (ref 13.0–17.0)
Lymphocytes Relative: 29.3 % (ref 12.0–46.0)
Lymphs Abs: 3.5 10*3/uL (ref 0.7–4.0)
MCHC: 33.7 g/dL (ref 30.0–36.0)
MCV: 91.6 fl (ref 78.0–100.0)
MONO ABS: 0.7 10*3/uL (ref 0.1–1.0)
MONOS PCT: 5.7 % (ref 3.0–12.0)
Neutro Abs: 7.6 10*3/uL (ref 1.4–7.7)
Neutrophils Relative %: 62.8 % (ref 43.0–77.0)
Platelets: 316 10*3/uL (ref 150.0–400.0)
RBC: 4.88 Mil/uL (ref 4.22–5.81)
RDW: 13.8 % (ref 11.5–15.5)
WBC: 12.1 10*3/uL — AB (ref 4.0–10.5)

## 2015-01-10 LAB — COMPREHENSIVE METABOLIC PANEL
ALBUMIN: 4.4 g/dL (ref 3.5–5.2)
ALK PHOS: 106 U/L (ref 39–117)
ALT: 16 U/L (ref 0–53)
AST: 17 U/L (ref 0–37)
BILIRUBIN TOTAL: 0.6 mg/dL (ref 0.2–1.2)
BUN: 11 mg/dL (ref 6–23)
CO2: 31 mEq/L (ref 19–32)
Calcium: 9.7 mg/dL (ref 8.4–10.5)
Chloride: 92 mEq/L — ABNORMAL LOW (ref 96–112)
Creatinine, Ser: 0.87 mg/dL (ref 0.40–1.50)
GFR: 98.72 mL/min (ref >60.00)
Glucose, Bld: 99 mg/dL (ref 70–99)
POTASSIUM: 4.9 meq/L (ref 3.5–5.1)
SODIUM: 130 meq/L — AB (ref 135–145)
TOTAL PROTEIN: 7.2 g/dL (ref 6.0–8.3)

## 2015-01-10 LAB — VITAMIN D 25 HYDROXY (VIT D DEFICIENCY, FRACTURES): VITD: 16.59 ng/mL — ABNORMAL LOW (ref 30.00–100.00)

## 2015-01-10 LAB — TSH: TSH: 8.21 u[IU]/mL — ABNORMAL HIGH (ref 0.35–4.50)

## 2015-01-10 LAB — POCT CBG (FASTING - GLUCOSE)-MANUAL ENTRY: Glucose Fasting, POC: 116 mg/dL — AB (ref 70–99)

## 2015-01-10 LAB — VITAMIN B12: VITAMIN B 12: 312 pg/mL (ref 211–911)

## 2015-01-10 MED ORDER — NAPROXEN 500 MG PO TABS: 500.0000 mg | ORAL_TABLET | Freq: Two times a day (BID) | ORAL | Status: DC

## 2015-01-10 MED ORDER — FLUTICASONE PROPIONATE 50 MCG/ACT NA SUSP: 2.0000 | Freq: Every day | NASAL | Status: DC

## 2015-01-10 NOTE — Progress Notes (Signed)
Pre visit review using our clinic review tool, if applicable. No additional management support is needed unless otherwise documented below in the visit note. 

## 2015-01-10 NOTE — Patient Instructions (Signed)
- Try cereve or cetaphil for your dry skin - STOP using goody powder daily.  - Use naproxen for headache for the next 1-2 days, then stop.  - Eat a small snack of protein (peanut butter cracker etc) before bed. Monitor sugars and headaches. If eating a snack improves your symptoms then I do not need to follow up with you. If you continue to have symptoms in 1-2 weeks I will need to see you again to evaluate further.  - Your vision was ok today.  - Start flonase for rhinitis, [facility]. - I will call you with the results of your labs once available.   Analgesic Rebound Headaches An analgesic rebound headache is a headache that returns after pain medicine (analgesic) that was taken to treat the initial headache wears off. People who suffer from tension, migraine, or cluster headaches are at risk for developing rebound headaches. Any type of primary headache can return as a rebound headache if you regularly take analgesics more than three times a week. If the cycle of rebound headaches continues, they become chronic daily headaches.  CAUSES Analgesics frequently associated with this problem include common over-the-counter medicines like aspirin, ibuprofen, acetaminophen, sinus relief medicines, and other medicines that contain caffeine. Narcotic pain medicines are also a common cause of rebound headaches.  SIGNS AND SYMPTOMS The symptoms of rebound headaches are the same as the symptoms of your initial headache. Symptoms of specific types of headaches include: Tension headache  Pressure around the head.  Dull, aching head pain.  Pain felt over the front and sides of the head.  Tenderness in the muscles of the head, neck and shoulders. Migraine Headache  Pulsing or throbbing pain on one or both sides of the head.  Severe pain that interferes with daily activities.  Pain that is worsened by physical activity.  Nausea, vomiting, or both.  Pain with exposure  to bright light, loud noises, or strong smells.  General sensitivity to bright light, loud noises, or strong smells.  Visual changes.  Numbness of one or both arms. Cluster Headaches  Severe pain that begins in or around one eye or temple.  Redness in the eye on the same side as the pain.  Droopy or swollen eyelid.  One-sided head pain.  Nausea.  Runny nose.  Sweaty, pale facial skin.  Restlessness. DIAGNOSIS  Analgesic rebound headaches are diagnosed by reviewing your medical history. This includes the nature of your initial headaches, as well as the type of pain medicines you have been using to treat your headaches and how often you take them. TREATMENT Discontinuing frequent use of the analgesic medicine will typically reduce the frequency of the rebound episodes. This may initially worsen your headaches but eventually the pain should become more manageable, less frequent, and less severe.  Seeing a headache specialists may helpful. He or she may be able to help you manage your headaches and to make sure there is not another cause of the headaches. Alternative methods of stress relief such as acupuncture, counseling, biofeedback, and massage may also be helpful. Talk with your health care provider about which alternative treatments might be good for you. HOME CARE INSTRUCTIONS Stopping the regular use of pain medicine can be difficult. Follow your health care provider's instructions carefully. Keep all of your appointments. Avoid triggers that are known to cause your primary headaches. SEEK MEDICAL CARE IF: You continue to experience headaches after following your health care provider's recommended  treatments. SEEK IMMEDIATE MEDICAL CARE IF:  You develop new headache pain.  You develop headache pain that is different than what you have experienced in the past.  You develop numbness or tingling in your arms or legs.  You develop changes in your speech or vision. MAKE SURE  YOU:  Understand these instructions.  Will watch your child's condition.  Will get help right away if your child is not doing well or gets worse.   This information is not intended to replace advice given to you by your health care provider. Make sure you discuss any questions you have with your health care provider.   Document Released: 04/20/2003 Document Revised: 02/18/2014 Document Reviewed: 08/13/2012 Elsevier Interactive Patient Education 2016 [employer]  Sinus Headache A sinus headache occurs when the paranasal sinuses become clogged or swollen. Paranasal sinuses are air pockets within the bones of the face. Sinus headaches can range from mild to severe. CAUSES A sinus headache can result from various conditions that affect the sinuses, such as:  Colds.  Sinus infections.  Allergies. SYMPTOMS The main symptom of this condition is a headache that may feel like pain or pressure in the face, forehead, ears, or upper teeth. People who have a sinus headache often have other symptoms, such as:  Congested or runny nose.  Fever.  Inability to smell. Weather changes can make symptoms worse. DIAGNOSIS This condition may be diagnosed based on:  A physical exam and medical history.  Imaging tests, such as a CT scan and MRI, to check for problems with the sinuses.  A specialist may look into the sinuses with a tool that has a camera (endoscopy). TREATMENT Treatment for this condition depends on the cause.  Sinus pain that is caused by a sinus infection may be treated with antibiotic medicine.  Sinus pain that is caused by allergies may be helped by allergy medicines (antihistamines) and medicated nasal sprays.  Sinus pain that is caused by congestion may be helped by flushing the nose and sinuses with saline solution. HOME CARE INSTRUCTIONS  Take medicines only as directed by your health care provider.  If you were prescribed an antibiotic medicine, finish all of it  even if you start to feel better.  If you have congestion, use a nasal spray to help reduce pressure.  If directed, apply a warm, moist washcloth to your face to help relieve pain. SEEK MEDICAL CARE IF:  You have headaches more than one time each week.  You have sensitivity to light or sound.  You have a fever.  You feel sick to your stomach (nauseous) or you throw up (vomit).  Your headaches do not get better with treatment. Many people think that they have a sinus headache when they actually have migraines or tension headaches. SEEK IMMEDIATE MEDICAL CARE IF:  You have vision problems.  You have sudden, severe pain in your face or head.  You have a seizure.  You are confused.  You have a stiff neck.   This information is not intended to replace advice given to you by your health care provider. Make sure you discuss any questions you have with your health care provider.   Document Released: 03/07/2004 Document Revised: 06/14/2014 Document Reviewed: 01/24/2014 Elsevier Interactive Patient Education [employer]

## 2015-01-10 NOTE — Progress Notes (Signed)
Subjective:    Patient ID: Daniel Stevenson, male    DOB: [birth year], 49 y.o.   MRN: [id number]  HPI  Headaches: Patient presents for an acute office visit with complaints of worsening headaches over the last 2 months. Patient states he has at least 4 headaches a week. Patient is reporting compliance with his amitriptyline use. He states he feels the headaches seem to be more prevalent when he awakes in the morning if his blood sugars are low. He feels his headaches improve throughout the day. He states if he eats and takes [facility] powder his headaches improved. Patient is taking BC powder at least 3 times a week, a couple times a day. He reports headache this morning, frontal in location, that he has been unable to shake after eating. He reports when he woke up this morning his blood sugar was 68. Patient is on short-acting and long-acting insulin. He takes his Lantus dose at night. He states that he eats dinner approximate 6 or 7:00 at night, does not eat a nighttime snack, and takes his Lantus 10 units around 9 PM. He reports this morning after eating he still felt clammy and fatigued. He also endorses myalgias. Patient did receive his flu shot this year. He denies nasal congestion, cough, fever, chills, vomit, diarrhea or rhinorrhea, but states he might have some sinus pressure. Patient endorses nausea, with headache only. Patient states he doesn't have a migraine history, however EMR reports a history of migraines. Patient is a diabetic, on NovoLog and Lantus, last A1c 4 weeks ago 7.5. Patient uses reading glasses only, last eye exam over a year ago. He does not feel that he has changes in his vision. Patient has a history of sinus surgery.   Current everyday Smoker   Past Medical History  Diagnosis Date  . Hypothyroidism   . Back pain     s/p back surgery  . Migraines   . Tobacco dependence in remission     quit 2012  . PLANTAR WART, RIGHT 10/09/2009  . Diabetes mellitus, type 2 (HCC)  08/2012--dx'd  . Neuropathic pain of right lower extremity   . History of low back pain 06/24/2011  . Hyperlipidemia   . Chronic hyponatremia 08/2012 onset    Onset was right after starting amitriptyline for his neuropathic leg pain--has been stable.  Likely ADH-like effect on kidney from amitriptyline.   Allergies  Allergen Reactions  . Oxycodone-Acetaminophen     REACTION: itching   Past Surgical History  Procedure Laterality Date  . Back surgery  2009    lower lumbar fusion- rubber disc in back  . Nasal sinus surgery  2007  . Cardiovascular stress test  03/21/14    No ischemia/low risk study; however, LV function showed EF 49% and global LV hypokinesis--echo recommended and it showed normal LV fxn.  . Transthoracic echocardiogram  03/24/14    Normal LV fxn, EF 50-55%, no wall motion abnormalities: (grade 1 diast dysfxn)     Review of Systems Negative, with the exception of above mentioned in HPI      Objective:   Physical Exam BP 121/80 mmHg  Pulse 86  Temp(Src) 97.7 F (36.5 C) (Temporal)  Resp 18  Ht  (1.651 m)  Wt 149 lb (67.586 kg)  BMI 24.79 kg/m2  SpO2 98% Gen: Afebrile. No acute distress. Nontoxic appearance, well-developed, well-nourished, thin Caucasian male. Pleasant. HENT: AT. [place]. Bilateral TM visualized and normal in appearance. MMM, without oral lesions. Bilateral nares mild  erythema, right nasal passage swelling. Throat without erythema or exudates. No tenderness to palpation maxillary sinuses. No cough on exam. No hoarseness on exam. Eyes:Pupils Equal Round Reactive to light, Extraocular movements intact,  Conjunctiva without redness, discharge or icterus. Neck/lymp/endocrine: Supple, no lymphadenopathy, no thyromegaly CV: RRR, no edema Chest: CTAB, no wheeze or crackles Skin: No rashes, purpura or petechiae.  Neuro: Normal gait. PERLA. EOMi. Alert. Oriented x3. Cranial nerves II through XII intact. Muscle strength 5/5 upper and lower extremity.    Psych: Normal affect, dress and demeanor. Normal speech. Normal thought content and judgment..  Vision/Eyes: 20/25 OS/OD/both eyes    Assessment & Plan:  Nonintractable headache, unspecified chronicity pattern, unspecified headache type/fatigue - POCT CBG (Fasting - Glucose)--> 116 - Uncertain etiology of headaches, patient on amitriptyline, past medical history of migraines although he denies today. - Discussed low blood sugars as potential cause today. Patient encouraged to record blood sugars before bed and fasting in the morning, and record any headaches. Therefore been able to correlate possible low blood sugar to headaches. Patient encouraged to also start eating a very light, protein rich snack prior to bed. - vision exam today is normal. - TSH, vitamin D, B-12, RPR, CBC and chemistry panel for potential causes in increase in headaches. - Patient strongly encouraged to stop BC powder use, discussed rebound headaches. - Patient encouraged to use naproxen 2 times a day for the next 1-2 days for headache, and then discontinue use if able. - naproxen (NAPROSYN) 500 MG tablet; Take 1 tablet (500 mg total) by mouth 2 (two) times daily with a meal.  Dispense: 30 tablet; Refill: 0 - Discussed the possibility of viral causes causing increase in fatigue, however this should not cause increase in headaches over the last 2 months. Patient did have signs and symptoms of allergic rhinitis, will start Flonase. - Patient is a smoker, encouraged to stop smoking. Also discussed if patient is still experiencing headaches in 2 weeks after the above changes, he is to be seen for reevaluation.  2 weeks. f/u

## 2015-01-11 ENCOUNTER — Other Ambulatory Visit: Payer: Self-pay | Admitting: *Deleted

## 2015-01-11 ENCOUNTER — Telehealth: Payer: Self-pay | Admitting: Family Medicine

## 2015-01-11 DIAGNOSIS — E538 Deficiency of other specified B group vitamins: Secondary | ICD-10-CM

## 2015-01-11 DIAGNOSIS — E559 Vitamin D deficiency, unspecified: Secondary | ICD-10-CM

## 2015-01-11 DIAGNOSIS — E039 Hypothyroidism, unspecified: Secondary | ICD-10-CM

## 2015-01-11 LAB — RPR

## 2015-01-11 MED ORDER — VITAMIN D (ERGOCALCIFEROL) 1.25 MG (50000 UNIT) PO CAPS: 50000.0000 [IU] | ORAL_CAPSULE | ORAL | Status: DC

## 2015-01-11 MED ORDER — LEVOTHYROXINE SODIUM 137 MCG PO TABS: 125.0000 ug | ORAL_TABLET | Freq: Every day | ORAL | Status: DC

## 2015-01-11 MED ORDER — VITAMIN B-12 1000 MCG PO TABS: 1000.0000 ug | ORAL_TABLET | Freq: Every day | ORAL | Status: DC

## 2015-01-11 NOTE — Telephone Encounter (Signed)
LMOM for pt to CB to discuss results.  

## 2015-01-11 NOTE — Telephone Encounter (Signed)
Please call pt: - his vit D and B are low, and need supplemented. - His thyroid medication needs increased. Please confirm he is taking daily, on an empty stomach. - He does have a mild elevation in white cell count, suggesting a a possible viral infection.  - Some of his electrolytes are also off, but this is likely to his thyroid not function well. - I have called a new dose of thyroid medication for him to start and once a week vitamin D supplement. I have also called in once a day vit B supplement. We will need to test all three of these (B,D, tsh) in 12 weeks.

## 2015-01-11 NOTE — Telephone Encounter (Signed)
Pt advised and voiced understanding. Future labs ordered. 

## 2015-03-13 ENCOUNTER — Encounter: Payer: Self-pay | Admitting: Family Medicine

## 2015-03-13 ENCOUNTER — Ambulatory Visit (INDEPENDENT_AMBULATORY_CARE_PROVIDER_SITE_OTHER): Payer: BLUE CROSS/BLUE SHIELD | Admitting: Family Medicine

## 2015-03-13 VITALS — BP 116/79 | HR 88 | Temp 98.4°F | Resp 16 | Ht 65.0 in | Wt 148.2 lb

## 2015-03-13 DIAGNOSIS — G5791 Unspecified mononeuropathy of right lower limb: Secondary | ICD-10-CM | POA: Diagnosis not present

## 2015-03-13 DIAGNOSIS — M792 Neuralgia and neuritis, unspecified: Secondary | ICD-10-CM

## 2015-03-13 MED ORDER — GABAPENTIN 100 MG PO CAPS: ORAL_CAPSULE | ORAL | Status: DC

## 2015-03-13 MED ORDER — AMITRIPTYLINE HCL 25 MG PO TABS: ORAL_TABLET | ORAL | Status: DC

## 2015-03-13 NOTE — Progress Notes (Signed)
OFFICE VISIT  03/13/2015   CC:  Chief Complaint  Patient presents with  . Tingling    right leg x 3 weeks  . Follow-up    Pt is fasting. If there is time.    HPI:    Patient is a 50 y.o. Caucasian male who presents for ongoing R leg pain.  Has hx of this pain that we've successfully treated as neuropathic type pain with amitriptyline over the last couple of years.  Over the last 3 weeks he has noted worsening of his pain: describes a sharp tingling-type pain over R knee extending down and up leg in stocking glove distribution.  No weakness of leg.  No stumbling or limp with walking.  Hurts the longer he stands on it.  The more walking he does the worse it is. Sometimes lying down relieves it but not all the time.   He doesn't recall any injury/strain recently, no change in routine that could have made this pain come back. Amitrip two of the '25mg'$  tabs qhs does not give any adverse side effects, but taking 1 '25mg'$  amitrip qAM did cause too much sedation effect so he stopped this dose.  Past Medical History  Diagnosis Date  . Hypothyroidism   . Back pain     s/p back surgery  . Migraines   . Tobacco dependence in remission     quit 2012  . PLANTAR WART, RIGHT 10/09/2009  . Diabetes mellitus, type 2 ([place]) 08/2012--dx'd  . Neuropathic pain of right lower extremity   . History of low back pain 06/24/2011  . Hyperlipidemia   . Chronic hyponatremia 08/2012 onset    Onset was right after starting amitriptyline for his neuropathic leg pain--has been stable.  Likely ADH-like effect on kidney from amitriptyline.    Past Surgical History  Procedure Laterality Date  . Back surgery  2009    lower lumbar fusion- rubber disc in back  . Nasal sinus surgery  2007  . Cardiovascular stress test  03/21/14    No ischemia/low risk study; however, LV function showed EF 49% and global LV hypokinesis--echo recommended and it showed normal LV fxn.  . Transthoracic echocardiogram  03/24/14    Normal LV fxn,  EF 50-55%, no wall motion abnormalities: (grade 1 diast dysfxn)    Outpatient Prescriptions Prior to Visit  Medication Sig Dispense Refill  . aspirin 81 MG tablet Take 1 tablet (81 mg total) by mouth daily. 30 tablet 0  . atorvastatin (LIPITOR) 20 MG tablet Take 1 tablet (20 mg total) by mouth daily. 90 tablet 3  . Blood Glucose Monitoring Suppl (ONE TOUCH ULTRA SYSTEM KIT) W/DEVICE KIT Use as directed to monitor glucose 1 each 0  . fluticasone (FLONASE) 50 MCG/ACT nasal spray Place 2 sprays into both nostrils daily. 16 g 6  . glucose blood (FREESTYLE INSULINX TEST) test strip Use as instructed 50 each 6  . ibuprofen (ADVIL,MOTRIN) 200 MG tablet Take 200 mg by mouth every 6 (six) hours as needed.    . insulin aspart (NOVOLOG FLEXPEN) 100 UNIT/ML FlexPen 5 U SQ q AC 15 mL 12  . Insulin Glargine (LANTUS SOLOSTAR) 100 UNIT/ML Solostar Pen 10 U SQ qhs 5 pen 11  . Insulin Pen Needle (INSUPEN PEN NEEDLES) 32G X 4 MM MISC (BD Ultra-fine needles) Use 4 times daily as needed.  Dx:E11.65 100 each 11  . Lancets (FREESTYLE) lancets Use as instructed 100 each 6  . levothyroxine (SYNTHROID, LEVOTHROID) 137 MCG tablet Take 1 tablet (  137 mcg total) by mouth daily before breakfast. 30 tablet 2  . vitamin B-12 (CYANOCOBALAMIN) 1000 MCG tablet Take 1 tablet (1,000 mcg total) by mouth daily. 90 tablet 0  . amitriptyline (ELAVIL) 25 MG tablet TAKE 1 TABLET BY MOUTH IN THE MORNING & TAKE 2 TABLETS BY MOUTH AT BEDTIME 90 tablet 12  . naproxen (NAPROSYN) 500 MG tablet Take 1 tablet (500 mg total) by mouth 2 (two) times daily with a meal. (Patient not taking: Reported on 03/13/2015) 30 tablet 0  . Vitamin D, Ergocalciferol, (DRISDOL) 50000 UNITS CAPS capsule Take 1 capsule (50,000 Units total) by mouth every 7 (seven) days. (Patient not taking: Reported on 03/13/2015) 12 capsule 0   No facility-administered medications prior to visit.    Allergies  Allergen Reactions  . Oxycodone-Acetaminophen     REACTION: itching     ROS As per HPI  PE: Blood pressure 116/79, pulse 88, temperature 98.4 F (36.9 C), temperature source Oral, resp. rate 16, height '5\' 5"'$  (1.651 m), weight 148 lb 4 oz (67.246 kg), SpO2 99 %. Gen: Alert, well appearing.  Patient is oriented to person, place, time, and situation. Legs: strength 5/5 prox/dist bilat.  No sensory deficit in either leg with testing by the touch of my finger and also by monofilament testing.  Trace DTRs in patella and achilles areas bilat.  Straight leg raise does not elicit any pain or paresthesias.  Femoral, popliteal, DP, and PT pulses all intact bilat.  LABS:  None today  IMPRESSION AND PLAN:  Chronic R leg neuropathic pain, gradual worsening/no longer completely responsive to amitriptyline '50mg'$  qhs. Will increase amitriptyline to THREE of the '25mg'$  tabs qhs, and start gabapentin 100 mg qAM and do slow titration up to 300 mg bid dosing if needed.  Therapeutic expectations and side effect profile of medication discussed today.  Patient's questions answered.  An After Visit Summary was printed and given to the patient.  FOLLOW UP: Return for 30 min f/u chronic med prob at pt's convenience.

## 2015-03-13 NOTE — Progress Notes (Signed)
Pre visit review using our clinic review tool, if applicable. No additional management support is needed unless otherwise documented below in the visit note. 

## 2015-03-17 ENCOUNTER — Ambulatory Visit: Payer: BLUE CROSS/BLUE SHIELD | Admitting: Family Medicine

## 2015-03-31 ENCOUNTER — Ambulatory Visit (INDEPENDENT_AMBULATORY_CARE_PROVIDER_SITE_OTHER): Payer: BLUE CROSS/BLUE SHIELD | Admitting: Family Medicine

## 2015-03-31 ENCOUNTER — Encounter: Payer: Self-pay | Admitting: Family Medicine

## 2015-03-31 VITALS — BP 125/79 | HR 89 | Temp 98.4°F | Resp 16 | Ht 65.0 in | Wt 144.8 lb

## 2015-03-31 DIAGNOSIS — E039 Hypothyroidism, unspecified: Secondary | ICD-10-CM

## 2015-03-31 DIAGNOSIS — E538 Deficiency of other specified B group vitamins: Secondary | ICD-10-CM | POA: Diagnosis not present

## 2015-03-31 DIAGNOSIS — E119 Type 2 diabetes mellitus without complications: Secondary | ICD-10-CM | POA: Diagnosis not present

## 2015-03-31 DIAGNOSIS — G5791 Unspecified mononeuropathy of right lower limb: Secondary | ICD-10-CM

## 2015-03-31 DIAGNOSIS — M792 Neuralgia and neuritis, unspecified: Secondary | ICD-10-CM

## 2015-03-31 DIAGNOSIS — E785 Hyperlipidemia, unspecified: Secondary | ICD-10-CM

## 2015-03-31 DIAGNOSIS — E559 Vitamin D deficiency, unspecified: Secondary | ICD-10-CM

## 2015-03-31 LAB — MICROALBUMIN / CREATININE URINE RATIO
Creatinine,U: 161.5 mg/dL
MICROALB/CREAT RATIO: 2.2 mg/g (ref 0.0–30.0)
Microalb, Ur: 3.6 mg/dL — ABNORMAL HIGH (ref 0.0–1.9)

## 2015-03-31 LAB — VITAMIN D 25 HYDROXY (VIT D DEFICIENCY, FRACTURES): VITD: 30.38 ng/mL (ref 30.00–100.00)

## 2015-03-31 LAB — HEMOGLOBIN A1C: Hgb A1c MFr Bld: 7.5 % — ABNORMAL HIGH (ref 4.6–6.5)

## 2015-03-31 LAB — TSH: TSH: 1.71 u[IU]/mL (ref 0.35–4.50)

## 2015-03-31 LAB — VITAMIN B12: Vitamin B-12: >1500 pg/mL — ABNORMAL HIGH (ref 211–911)

## 2015-03-31 NOTE — Progress Notes (Signed)
OFFICE VISIT  03/31/2015   CC:  Chief Complaint  Patient presents with  . Follow-up    Pt is not fasting.    HPI:    Patient is a 50 y.o. Caucasian male who presents for 4 mo f/u DM, HLD, hypothyroidism. R leg neuropathic pain much improved: taking 3 amitrip qhs and a '100mg'$  gabapentin qAM and mid day.  Glucoses: fastings around 100.  Taking 10 U Lantus.  Taking 5 U novolog qAC.  2H PP 160-170s.  Taking thyroid med correctly every day.  Last check his TSH was a little up and his synthroid dose was boosted up from 125 to 137 mcg qd. Vit D was low: vit D supplement rx'd and taken--due for recheck.  Vit B12 in low normal range--he has been taking an OTC vit B12 pill daily.  Taking atorv daily, tolerating this well.   Past Medical History  Diagnosis Date  . Hypothyroidism   . Back pain     s/p back surgery  . Migraines   . Tobacco dependence in remission     quit 2012  . PLANTAR WART, RIGHT 10/09/2009  . Diabetes mellitus, type 2 ([place]) 08/2012--dx'd  . Neuropathic pain of right lower extremity   . History of low back pain 06/24/2011  . Hyperlipidemia   . Chronic hyponatremia 08/2012 onset    Onset was right after starting amitriptyline for his neuropathic leg pain--has been stable.  Likely ADH-like effect on kidney from amitriptyline.    Past Surgical History  Procedure Laterality Date  . Back surgery  2009    lower lumbar fusion- rubber disc in back  . Nasal sinus surgery  2007  . Cardiovascular stress test  03/21/14    No ischemia/low risk study; however, LV function showed EF 49% and global LV hypokinesis--echo recommended and it showed normal LV fxn.  . Transthoracic echocardiogram  03/24/14    Normal LV fxn, EF 50-55%, no wall motion abnormalities: (grade 1 diast dysfxn)    Outpatient Prescriptions Prior to Visit  Medication Sig Dispense Refill  . amitriptyline (ELAVIL) 25 MG tablet 3 tabs po qhs for R leg pain 90 tablet 12  . aspirin 81 MG tablet Take 1 tablet (81 mg  total) by mouth daily. 30 tablet 0  . atorvastatin (LIPITOR) 20 MG tablet Take 1 tablet (20 mg total) by mouth daily. 90 tablet 3  . Blood Glucose Monitoring Suppl (ONE TOUCH ULTRA SYSTEM KIT) W/DEVICE KIT Use as directed to monitor glucose 1 each 0  . fluticasone (FLONASE) 50 MCG/ACT nasal spray Place 2 sprays into both nostrils daily. 16 g 6  . gabapentin (NEURONTIN) 100 MG capsule 1-3 tabs po bid as needed for R leg pain 180 capsule 3  . glucose blood (FREESTYLE INSULINX TEST) test strip Use as instructed 50 each 6  . ibuprofen (ADVIL,MOTRIN) 200 MG tablet Take 200 mg by mouth every 6 (six) hours as needed.    . insulin aspart (NOVOLOG FLEXPEN) 100 UNIT/ML FlexPen 5 U SQ q AC 15 mL 12  . Insulin Glargine (LANTUS SOLOSTAR) 100 UNIT/ML Solostar Pen 10 U SQ qhs 5 pen 11  . Insulin Pen Needle (INSUPEN PEN NEEDLES) 32G X 4 MM MISC (BD Ultra-fine needles) Use 4 times daily as needed.  Dx:E11.65 100 each 11  . Lancets (FREESTYLE) lancets Use as instructed 100 each 6  . levothyroxine (SYNTHROID, LEVOTHROID) 137 MCG tablet Take 1 tablet (137 mcg total) by mouth daily before breakfast. 30 tablet 2  . vitamin  B-12 (CYANOCOBALAMIN) 1000 MCG tablet Take 1 tablet (1,000 mcg total) by mouth daily. 90 tablet 0   No facility-administered medications prior to visit.    Allergies  Allergen Reactions  . Oxycodone-Acetaminophen     REACTION: itching    ROS As per HPI  PE: Blood pressure 125/79, pulse 89, temperature 98.4 F (36.9 C), temperature source Oral, resp. rate 16, height '5\' 5"'$  (1.651 m), weight 144 lb 12 oz (65.658 kg), SpO2 98 %. Gen: Alert, well appearing.  Patient is oriented to person, place, time, and situation. AFFECT: pleasant, lucid thought and speech. No further exam today.  LABS:   Lab Results  Component Value Date   VITAMINB12 312 01/10/2015    Lab Results  Component Value Date   TSH 8.21* 01/10/2015   Lab Results  Component Value Date   WBC 12.1* 01/10/2015   HGB  15.1 01/10/2015   HCT 44.7 01/10/2015   MCV 91.6 01/10/2015   PLT 316.0 01/10/2015   Lab Results  Component Value Date   CREATININE 0.87 01/10/2015   BUN 11 01/10/2015   NA 130* 01/10/2015   K 4.9 01/10/2015   CL 92* 01/10/2015   CO2 31 01/10/2015   Lab Results  Component Value Date   ALT 16 01/10/2015   AST 17 01/10/2015   ALKPHOS 106 01/10/2015   BILITOT 0.6 01/10/2015   Lab Results  Component Value Date   CHOL 154 07/13/2013   Lab Results  Component Value Date   HDL 40.90 07/13/2013   Lab Results  Component Value Date   LDLCALC 99 07/13/2013   Lab Results  Component Value Date   TRIG 70.0 07/13/2013   Lab Results  Component Value Date   CHOLHDL 4 07/13/2013   Lab Results  Component Value Date   HGBA1C 7.5* 11/23/2014    IMPRESSION AND PLAN:  1) DM 2, sounds like control is good lately. HbA1c today. Urine microalb/cr today.  2) Hypothyroidism: dose titrated up a couple months ago--recheck TSH today (currently on 137 mcg qd).  3) Hyperlipidemia: not fasting today.  Tolerating statin.  Check FLP at next opportunity when pt fasting.  4) Hx of low normal Vit B12 and low vit D: now s/p replacment/supplement: recheck vit D and B12 levels today.  5) R leg radicular pain: improved now since increasing amitriptyline hs and adding gabapentin '100mg'$  qAM and 100 mg mid day.  An After Visit Summary was printed and given to the patient.  FOLLOW UP: Return in about 4 months (around 07/29/2015) for routine chronic illness f/u.

## 2015-03-31 NOTE — Progress Notes (Signed)
Pre visit review using our clinic review tool, if applicable. No additional management support is needed unless otherwise documented below in the visit note. 

## 2015-04-21 ENCOUNTER — Other Ambulatory Visit: Payer: Self-pay | Admitting: *Deleted

## 2015-04-21 MED ORDER — LEVOTHYROXINE SODIUM 137 MCG PO TABS: 125.0000 ug | ORAL_TABLET | Freq: Every day | ORAL | Status: DC

## 2015-05-02 ENCOUNTER — Ambulatory Visit (INDEPENDENT_AMBULATORY_CARE_PROVIDER_SITE_OTHER): Payer: BLUE CROSS/BLUE SHIELD | Admitting: Family Medicine

## 2015-05-02 ENCOUNTER — Encounter: Payer: Self-pay | Admitting: Family Medicine

## 2015-05-02 VITALS — BP 112/78 | HR 100 | Temp 98.4°F | Resp 20 | Wt 143.5 lb

## 2015-05-02 DIAGNOSIS — J029 Acute pharyngitis, unspecified: Secondary | ICD-10-CM | POA: Diagnosis not present

## 2015-05-02 DIAGNOSIS — L049 Acute lymphadenitis, unspecified: Secondary | ICD-10-CM | POA: Diagnosis not present

## 2015-05-02 LAB — POCT RAPID STREP A (OFFICE): RAPID STREP A SCREEN: NEGATIVE

## 2015-05-02 MED ORDER — METHYLPREDNISOLONE ACETATE 80 MG/ML IJ SUSP
80.0000 mg | Freq: Once | INTRAMUSCULAR | Status: AC
  Administered 2015-05-02: 80 mg via INTRAMUSCULAR

## 2015-05-02 MED ORDER — AMOXICILLIN-POT CLAVULANATE 875-125 MG PO TABS: 1.0000 | ORAL_TABLET | Freq: Two times a day (BID) | ORAL | Status: DC

## 2015-05-02 NOTE — Progress Notes (Signed)
Patient ID: Daniel Stevenson, male   DOB: 1965/05/27, 50 y.o.   MRN: [id number]    Daniel Stevenson , [birth year], 50 y.o., male MRN: [id number]  CC: Sore throat Subjective: Pt presents for an acute OV with complaints of  Sore throat of 5 days duration. Associated symptoms include hot flashes. Patient denies chills, nausea, vomit or diarrhea. Pt has tried nothing to ease their symptoms. Patient reports symptoms worsened  last night. He reports it hurts to swallow, left side is worse than the right. Swallowing motion causes pain,not surface of the throat pain with food irritating.  Diabetic patient, last a1c 7.5, 1 month ago.  Uses flonase daily.   Allergies  Allergen Reactions  . Oxycodone-Acetaminophen     REACTION: itching   Social History  Substance Use Topics  . Smoking status: Current Every Day Smoker -- 0.50 packs/day for 30 years    Types: Cigarettes    Last Attempt to Quit: 12/19/2010  . Smokeless tobacco: Never Used  . Alcohol Use: Yes     Comment: rarely   Past Medical History  Diagnosis Date  . Hypothyroidism   . Back pain     s/p back surgery  . Migraines   . Tobacco dependence in remission     quit 2012  . PLANTAR WART, RIGHT 10/09/2009  . Diabetes mellitus, type 2 ([place]) 08/2012--dx'd  . Neuropathic pain of right lower extremity   . History of low back pain 06/24/2011  . Hyperlipidemia   . Chronic hyponatremia 08/2012 onset    Onset was right after starting amitriptyline for his neuropathic leg pain--has been stable.  Likely ADH-like effect on kidney from amitriptyline.   Past Surgical History  Procedure Laterality Date  . Back surgery  2009    lower lumbar fusion- rubber disc in back  . Nasal sinus surgery  2007  . Cardiovascular stress test  03/21/14    No ischemia/low risk study; however, LV function showed EF 49% and global LV hypokinesis--echo recommended and it showed normal LV fxn.  . Transthoracic echocardiogram  03/24/14    Normal LV fxn, EF 50-55%, no  wall motion abnormalities: (grade 1 diast dysfxn)   Family History  Problem Relation Age of Onset  . Emphysema Mother     smoker  . Obesity Father      Medication List       This list is accurate as of: 05/02/15  1:31 PM.  Always use your most recent med list.               amitriptyline 25 MG tablet  Commonly known as:  ELAVIL  3 tabs po qhs for R leg pain     aspirin 81 MG tablet  Take 1 tablet (81 mg total) by mouth daily.     atorvastatin 20 MG tablet  Commonly known as:  LIPITOR  Take 1 tablet (20 mg total) by mouth daily.     fluticasone 50 MCG/ACT nasal spray  Commonly known as:  FLONASE  Place 2 sprays into both nostrils daily.     freestyle lancets  Use as instructed     gabapentin 100 MG capsule  Commonly known as:  NEURONTIN  1-3 tabs po bid as needed for R leg pain     glucose blood test strip  Commonly known as:  FREESTYLE INSULINX TEST  Use as instructed     ibuprofen 200 MG tablet  Commonly known as:  ADVIL,MOTRIN  Take 200 mg by mouth every  6 (six) hours as needed.     insulin aspart 100 UNIT/ML FlexPen  Commonly known as:  NOVOLOG FLEXPEN  5 U SQ q AC     Insulin Glargine 100 UNIT/ML Solostar Pen  Commonly known as:  LANTUS SOLOSTAR  10 U SQ qhs     Insulin Pen Needle 32G X 4 MM Misc  Commonly known as:  INSUPEN PEN NEEDLES  (BD Ultra-fine needles) Use 4 times daily as needed.  Dx:E11.65     levothyroxine 137 MCG tablet  Commonly known as:  SYNTHROID, LEVOTHROID  Take 1 tablet (137 mcg total) by mouth daily before breakfast.     multivitamin tablet  Take 1 tablet by mouth daily.     ONE TOUCH ULTRA SYSTEM KIT w/Device Kit  Use as directed to monitor glucose     vitamin B-12 1000 MCG tablet  Commonly known as:  CYANOCOBALAMIN  Take 1 tablet (1,000 mcg total) by mouth daily.        ROS: Negative, with the exception of above mentioned in HPI  Objective:  BP 112/78 mmHg  Pulse 100  Temp(Src) 98.4 F (36.9 C)  Resp 20  Wt  143 lb 8 oz (65.091 kg)  SpO2 96% Body mass index is 23.88 kg/(m^2). Gen: Afebrile. No acute distress. Nontoxic in appearance. Well developed, well nourished, male.  HENT: AT. [place]. Bilateral TM visualized and normal in appearance. MMM, no oral lesions. Bilateral nares without erythema or swelling. . Throat with moderate erythema.posterior pharynx swelling, no exudates. No cough or hoarseness on exam. No TTP facial sinus.  Eyes:Pupils Equal Round Reactive to light, Extraocular movements intact,  Conjunctiva without redness, discharge or icterus. Neck/lymp/endocrine: Supple, left submandivular lymphadenopathy CV: RRR  Chest: CTAB, no wheeze or crackles. Good air movement, normal resp effort.  Abd: Soft. NTND. BS present Neuro: Normal gait. PERLA. EOMi. Alert. Oriented x3   Assessment/Plan: Daniel Stevenson is a 50 y.o. male present for acute OV for  Acute pharyngitis, unspecified etiology/lymphadenitis: - rapid strep negative - IM depo medrol today - amoxicillin-clavulanate (AUGMENTIN) 875-125 MG tablet; Take 1 tablet by mouth 2 (two) times daily.  Dispense: 20 tablet; Refill: 0 - stop smoking.  - F/U in 1 week if not resolving or worsening.   electronically signed by:   Howard Pouch, DO  [place]

## 2015-05-02 NOTE — Patient Instructions (Signed)
-   rapid strep negative - IM depo medrol today - amoxicillin-clavulanate (AUGMENTIN) 875-125 MG tablet; Take 1 tablet by mouth 2 (two) times daily.  Dispense: 20 tablet; Refill: 0 - stop smoking.  - F/U in 1 week if not resolving or worsening.  Pharyngitis Pharyngitis is redness, pain, and swelling (inflammation) of your pharynx.  CAUSES  Pharyngitis is usually caused by infection. Most of the time, these infections are from viruses (viral) and are part of a cold. However, sometimes pharyngitis is caused by bacteria (bacterial). Pharyngitis can also be caused by allergies. Viral pharyngitis may be spread from person to person by coughing, sneezing, and personal items or utensils (cups, forks, spoons, toothbrushes). Bacterial pharyngitis may be spread from person to person by more intimate contact, such as kissing.  SIGNS AND SYMPTOMS  Symptoms of pharyngitis include:   Sore throat.   Tiredness (fatigue).   Low-grade fever.   Headache.  Joint pain and muscle aches.  Skin rashes.  Swollen lymph nodes.  Plaque-like film on throat or tonsils (often seen with bacterial pharyngitis). DIAGNOSIS  Your health care provider will ask you questions about your illness and your symptoms. Your medical history, along with a physical exam, is often all that is needed to diagnose pharyngitis. Sometimes, a rapid strep test is done. Other lab tests may also be done, depending on the suspected cause.  TREATMENT  Viral pharyngitis will usually get better in 3-4 days without the use of medicine. Bacterial pharyngitis is treated with medicines that kill germs (antibiotics).  HOME CARE INSTRUCTIONS   Drink enough water and fluids to keep your urine clear or pale yellow.   Only take over-the-counter or prescription medicines as directed by your health care provider:   If you are prescribed antibiotics, make sure you finish them even if you start to feel better.   Do not take aspirin.   Get  lots of rest.   Gargle with 8 oz of salt water ( tsp of salt per 1 qt of water) as often as every 1-2 hours to soothe your throat.   Throat lozenges (if you are not at risk for choking) or sprays may be used to soothe your throat. SEEK MEDICAL CARE IF:   You have large, tender lumps in your neck.  You have a rash.  You cough up green, yellow-brown, or bloody spit. SEEK IMMEDIATE MEDICAL CARE IF:   Your neck becomes stiff.  You drool or are unable to swallow liquids.  You vomit or are unable to keep medicines or liquids down.  You have severe pain that does not go away with the use of recommended medicines.  You have trouble breathing (not caused by a stuffy nose). MAKE SURE YOU:   Understand these instructions.  Will watch your condition.  Will get help right away if you are not doing well or get worse.   This information is not intended to replace advice given to you by your health care provider. Make sure you discuss any questions you have with your health care provider.   Document Released: 01/28/2005 Document Revised: 11/18/2012 Document Reviewed: 10/05/2012 Elsevier Interactive Patient Education [employer]2016 [employer] Inc.

## 2015-05-03 ENCOUNTER — Telehealth: Payer: Self-pay | Admitting: Family Medicine

## 2015-05-03 ENCOUNTER — Encounter: Payer: Self-pay | Admitting: Family Medicine

## 2015-05-03 ENCOUNTER — Ambulatory Visit (INDEPENDENT_AMBULATORY_CARE_PROVIDER_SITE_OTHER): Payer: BLUE CROSS/BLUE SHIELD | Admitting: Family Medicine

## 2015-05-03 VITALS — BP 110/68 | HR 110 | Temp 98.5°F | Resp 20 | Wt 141.0 lb

## 2015-05-03 DIAGNOSIS — R112 Nausea with vomiting, unspecified: Secondary | ICD-10-CM | POA: Diagnosis not present

## 2015-05-03 DIAGNOSIS — J029 Acute pharyngitis, unspecified: Secondary | ICD-10-CM

## 2015-05-03 DIAGNOSIS — E119 Type 2 diabetes mellitus without complications: Secondary | ICD-10-CM | POA: Diagnosis not present

## 2015-05-03 DIAGNOSIS — Z794 Long term (current) use of insulin: Secondary | ICD-10-CM | POA: Diagnosis not present

## 2015-05-03 DIAGNOSIS — IMO0001 Reserved for inherently not codable concepts without codable children: Secondary | ICD-10-CM

## 2015-05-03 LAB — GLUCOSE, POCT (MANUAL RESULT ENTRY): POC GLUCOSE: 335 mg/dL — AB (ref 70–99)

## 2015-05-03 MED ORDER — AZITHROMYCIN 250 MG PO TABS: ORAL_TABLET | ORAL | Status: DC

## 2015-05-03 MED ORDER — ONDANSETRON HCL 4 MG PO TABS: 4.0000 mg | ORAL_TABLET | Freq: Three times a day (TID) | ORAL | Status: DC | PRN

## 2015-05-03 NOTE — Progress Notes (Signed)
Patient ID: Daniel Stevenson, male   DOB: [birth year], 50 y.o.   MRN: [id number]    Daniel Stevenson , March 31, 1965, 49 y.o., male MRN: [id number]  CC: Sore throat Subjective: Pt was seen yesterday, for acute OV with complaints of  Sore throat of 5 days duration. Associated symptoms today include hot flashes, chills,  nausea, vomit (x1 after medicine). Denies diarrhea. Yesterday  He reported it hurt to swallow, left side is worse than the right. Swallowing motion causes pain,not surface of the throat pain with food irritating. Patient comes back today and reports he feels worse. He had an episode of vomiting after taking Augmentin. His BS last night was 74, his BG this morning was 355. Pt did not take insulin because he was afraid since he was not eating. He received an IM depo medrol shot yesterday. He has had nausea today.  Insulin dependent Diabetic patient, last a1c 7.5, 1 month ago.  Uses flonase daily.   Allergies  Allergen Reactions  . Oxycodone-Acetaminophen     REACTION: itching   Social History  Substance Use Topics  . Smoking status: Current Every Day Smoker -- 0.50 packs/day for 30 years    Types: Cigarettes    Last Attempt to Quit: 12/19/2010  . Smokeless tobacco: Never Used  . Alcohol Use: Yes     Comment: rarely   Past Medical History  Diagnosis Date  . Hypothyroidism   . Back pain     s/p back surgery  . Migraines   . Tobacco dependence in remission     quit 2012  . PLANTAR WART, RIGHT 10/09/2009  . Diabetes mellitus, type 2 ([place]) 08/2012--dx'd  . Neuropathic pain of right lower extremity   . History of low back pain 06/24/2011  . Hyperlipidemia   . Chronic hyponatremia 08/2012 onset    Onset was right after starting amitriptyline for his neuropathic leg pain--has been stable.  Likely ADH-like effect on kidney from amitriptyline.   Past Surgical History  Procedure Laterality Date  . Back surgery  2009    lower lumbar fusion- rubber disc in back  . Nasal sinus  surgery  2007  . Cardiovascular stress test  03/21/14    No ischemia/low risk study; however, LV function showed EF 49% and global LV hypokinesis--echo recommended and it showed normal LV fxn.  . Transthoracic echocardiogram  03/24/14    Normal LV fxn, EF 50-55%, no wall motion abnormalities: (grade 1 diast dysfxn)   Family History  Problem Relation Age of Onset  . Emphysema Mother     smoker  . Obesity Father      Medication List       This list is accurate as of: 05/03/15 11:31 AM.  Always use your most recent med list.               amitriptyline 25 MG tablet  Commonly known as:  ELAVIL  3 tabs po qhs for R leg pain     amoxicillin-clavulanate 875-125 MG tablet  Commonly known as:  AUGMENTIN  Take 1 tablet by mouth 2 (two) times daily.     aspirin 81 MG tablet  Take 1 tablet (81 mg total) by mouth daily.     atorvastatin 20 MG tablet  Commonly known as:  LIPITOR  Take 1 tablet (20 mg total) by mouth daily.     fluticasone 50 MCG/ACT nasal spray  Commonly known as:  FLONASE  Place 2 sprays into both nostrils daily.  freestyle lancets  Use as instructed     gabapentin 100 MG capsule  Commonly known as:  NEURONTIN  1-3 tabs po bid as needed for R leg pain     glucose blood test strip  Commonly known as:  FREESTYLE INSULINX TEST  Use as instructed     ibuprofen 200 MG tablet  Commonly known as:  ADVIL,MOTRIN  Take 200 mg by mouth every 6 (six) hours as needed.     insulin aspart 100 UNIT/ML FlexPen  Commonly known as:  NOVOLOG FLEXPEN  5 U SQ q AC     Insulin Glargine 100 UNIT/ML Solostar Pen  Commonly known as:  LANTUS SOLOSTAR  10 U SQ qhs     Insulin Pen Needle 32G X 4 MM Misc  Commonly known as:  INSUPEN PEN NEEDLES  (BD Ultra-fine needles) Use 4 times daily as needed.  Dx:E11.65     levothyroxine 137 MCG tablet  Commonly known as:  SYNTHROID, LEVOTHROID  Take 1 tablet (137 mcg total) by mouth daily before breakfast.     multivitamin tablet    Take 1 tablet by mouth daily.     ONE TOUCH ULTRA SYSTEM KIT w/Device Kit  Use as directed to monitor glucose     vitamin B-12 1000 MCG tablet  Commonly known as:  CYANOCOBALAMIN  Take 1 tablet (1,000 mcg total) by mouth daily.        ROS: Negative, with the exception of above mentioned in HPI  Objective:  BP 110/68 mmHg  Pulse 110  Temp(Src) 98.5 F (36.9 C)  Resp 20  Wt 141 lb (63.957 kg)  SpO2 96% Body mass index is 23.46 kg/(m^2). Gen: Afebrile. No acute distress. Nontoxic in appearance. Well developed, well nourished, male.  HENT: AT. [place]. Bilateral TM visualized and normal in appearance. MMM, no oral lesions. Bilateral nares without erythema or swelling. . Throat with moderate erythema.posterior pharynx swelling, no exudates. No cough or hoarseness on exam. No TTP facial sinus.  Eyes:Pupils Equal Round Reactive to light, Extraocular movements intact,  Conjunctiva without redness, discharge or icterus. Neck/lymp/endocrine: Supple, left submandivular lymphadenopathy CV: Tachycardia Chest: CTAB, no wheeze or crackles. Good air movement, normal resp effort.  Abd: Soft. NTND. BS present Neuro: Normal gait. PERLA. EOMi. Alert. Oriented x3  CBG (last 3)  Blood glucose 355 today by glucometer  Assessment/Plan: Daniel Stevenson is a 50 y.o. male present for acute OV for  Acute pharyngitis, unspecified etiology/lymphadenitis: - rapid strep negative yesterday  - DC Augmentin will to "allergy" list - Start zofran for nausea.  - Start Z-Pack - Cover BG - amoxicillin-clavulanate (AUGMENTIN) 875-125 MG tablet; Take 1 tablet by mouth 2 (two) times daily.  Dispense: 20 tablet; Refill: 0 - stop smoking.  - F/U in 1 week if not resolving or worsening.   electronically signed by:   Howard Pouch, DO  [place]

## 2015-05-03 NOTE — Telephone Encounter (Signed)
Rx from yesterday made patient sick last night. Please call.

## 2015-05-03 NOTE — Telephone Encounter (Signed)
Patient scheduled for appt today

## 2015-05-03 NOTE — Telephone Encounter (Signed)
Patient Name: Daniel StainROBERT Gover DOB: 1965-03-23 Initial Comment caller states her husband saw the dr yesterday - he is a diabetic - was given abx - since he took them - he has been vomiting and has nausea Nurse Assessment Nurse: Loletta Specterorbin, RN, Misty StanleyLisa Date/Time Daniel Stevenson(Eastern Time): 05/03/2015 9:58:12 AM Confirm and document reason for call. If symptomatic, describe symptoms. You must click the next button to save text entered. ---Caller states her husband is vomited x 7 hours. He was seen yesterday in office and given shot for antibiotics and also oral antibiotics. He has not vomited this am but is nausea. Treating for pharyngitis. Diabetic, Insulin dependent. Amox-Clav 875/125 ng po BID x 10 days. Feels warm and sweating but does not have thermometer. Fasting BS this am 350, last night was in the 50s. Did not take insulin last night or this am. Has the patient traveled out of the country within the last 30 days? ---No Does the patient have any new or worsening symptoms? ---Yes Will a triage be completed? ---Yes Related visit to physician within the last 2 weeks? ---Yes Does the PT have any chronic conditions? (i.e. diabetes, asthma, etc.) ---Yes List chronic conditions. ---Diabetes, thyroid problems Is this a behavioral health or substance abuse call? ---No Guidelines Guideline Title Affirmed Question Affirmed Notes Vomiting High-risk adult (e.g., diabetes mellitus, brain tumor, V-P shunt, hernia) Final Disposition User Go to ED Now (or PCP triage) Loletta Specterorbin, RN, Misty StanleyLisa Comments Blood glucose while on the phone 307, after vomiting x 7 hours overnight (last vomited at 0400). No insulin yet this am Appointment made for 1115 with R. Kuneff Referrals GO TO FACILITY UNDECIDED Disagree/Comply: Comply

## 2015-05-03 NOTE — Patient Instructions (Signed)
I have called in a different antibiotic for you, yo have had this one in the past.  I have also called in zofran, take 30 minutes before food/medication, to make sure to keep food/meds down.  Cover your blood glucoses, the steroid shot causes some elevation in sugar for a couple days.

## 2015-05-03 NOTE — Telephone Encounter (Signed)
Patient's wife called back. His sugar levels are low due to the vomiting during the night. [facility].

## 2015-05-03 NOTE — Telephone Encounter (Signed)
Patient was seen in office today. Closing note

## 2015-07-28 ENCOUNTER — Ambulatory Visit (INDEPENDENT_AMBULATORY_CARE_PROVIDER_SITE_OTHER): Payer: BLUE CROSS/BLUE SHIELD | Admitting: Family Medicine

## 2015-07-28 ENCOUNTER — Encounter: Payer: Self-pay | Admitting: Family Medicine

## 2015-07-28 VITALS — BP 116/78 | HR 94 | Temp 98.0°F | Resp 16 | Ht 65.0 in | Wt 143.8 lb

## 2015-07-28 DIAGNOSIS — E119 Type 2 diabetes mellitus without complications: Secondary | ICD-10-CM | POA: Diagnosis not present

## 2015-07-28 DIAGNOSIS — E039 Hypothyroidism, unspecified: Secondary | ICD-10-CM | POA: Diagnosis not present

## 2015-07-28 DIAGNOSIS — E871 Hypo-osmolality and hyponatremia: Secondary | ICD-10-CM | POA: Diagnosis not present

## 2015-07-28 DIAGNOSIS — E785 Hyperlipidemia, unspecified: Secondary | ICD-10-CM

## 2015-07-28 LAB — BASIC METABOLIC PANEL
BUN: 17 mg/dL (ref 6–23)
CHLORIDE: 94 meq/L — AB (ref 96–112)
CO2: 29 meq/L (ref 19–32)
CREATININE: 0.96 mg/dL (ref 0.40–1.50)
Calcium: 9 mg/dL (ref 8.4–10.5)
GFR: 87.93 mL/min (ref >60.00)
Glucose, Bld: 164 mg/dL — ABNORMAL HIGH (ref 70–99)
Potassium: 4.4 mEq/L (ref 3.5–5.1)
Sodium: 129 mEq/L — ABNORMAL LOW (ref 135–145)

## 2015-07-28 LAB — TSH: TSH: 1.5 u[IU]/mL (ref 0.35–4.50)

## 2015-07-28 LAB — HEMOGLOBIN A1C: Hgb A1c MFr Bld: 7.2 % — ABNORMAL HIGH (ref 4.6–6.5)

## 2015-07-28 NOTE — Progress Notes (Signed)
OFFICE VISIT  07/28/2015   CC:  Chief Complaint  Patient presents with  . Follow-up    Pt is not fasting.    HPI:    Patient is a 50 y.o. Caucasian male who presents for 4 mo f/u DM 2, HLD, hypothyroidism, and hyponatremia. Feeling fine. Fasting gluc's around 90-100. 2H PPs 150-160.   Taking lantus 14 U qhs and novolog 6 U qAC.  Taking synthroid daily.    Taking atorv qd, no side effects.  Neuropathic pain in R leg well controlled on amitriptyline and neurontin.  ROS: no HAs, no polyuria or polydipsia, no myalgias, no feet pain, no rash, no fatigue, no depression.  Past Medical History  Diagnosis Date  . Hypothyroidism   . Back pain     s/p back surgery  . Migraines   . Tobacco dependence in remission     quit 2012  . PLANTAR WART, RIGHT 10/09/2009  . Diabetes mellitus, type 2 ([facility]) 08/2012--dx'd  . Neuropathic pain of right lower extremity   . History of low back pain 06/24/2011  . Hyperlipidemia   . Chronic hyponatremia 08/2012 onset    Onset was right after starting amitriptyline for his neuropathic leg pain--has been stable.  Likely ADH-like effect on kidney from amitriptyline.    Past Surgical History  Procedure Laterality Date  . Back surgery  2009    lower lumbar fusion- rubber disc in back  . Nasal sinus surgery  2007  . Cardiovascular stress test  03/21/14    No ischemia/low risk study; however, LV function showed EF 49% and global LV hypokinesis--echo recommended and it showed normal LV fxn.  . Transthoracic echocardiogram  03/24/14    Normal LV fxn, EF 50-55%, no wall motion abnormalities: (grade 1 diast dysfxn)    Outpatient Prescriptions Prior to Visit  Medication Sig Dispense Refill  . amitriptyline (ELAVIL) 25 MG tablet 3 tabs po qhs for R leg pain 90 tablet 12  . aspirin 81 MG tablet Take 1 tablet (81 mg total) by mouth daily. 30 tablet 0  . atorvastatin (LIPITOR) 20 MG tablet Take 1 tablet (20 mg total) by mouth daily. 90 tablet 3  . Blood Glucose  Monitoring Suppl (ONE TOUCH ULTRA SYSTEM KIT) W/DEVICE KIT Use as directed to monitor glucose 1 each 0  . fluticasone (FLONASE) 50 MCG/ACT nasal spray Place 2 sprays into both nostrils daily. 16 g 6  . gabapentin (NEURONTIN) 100 MG capsule 1-3 tabs po bid as needed for R leg pain 180 capsule 3  . glucose blood (FREESTYLE INSULINX TEST) test strip Use as instructed 50 each 6  . ibuprofen (ADVIL,MOTRIN) 200 MG tablet Take 200 mg by mouth every 6 (six) hours as needed.    . insulin aspart (NOVOLOG FLEXPEN) 100 UNIT/ML FlexPen 5 U SQ q AC 15 mL 12  . Insulin Glargine (LANTUS SOLOSTAR) 100 UNIT/ML Solostar Pen 10 U SQ qhs 5 pen 11  . Insulin Pen Needle (INSUPEN PEN NEEDLES) 32G X 4 MM MISC (BD Ultra-fine needles) Use 4 times daily as needed.  Dx:E11.65 100 each 11  . Lancets (FREESTYLE) lancets Use as instructed 100 each 6  . levothyroxine (SYNTHROID, LEVOTHROID) 137 MCG tablet Take 1 tablet (137 mcg total) by mouth daily before breakfast. 30 tablet 2  . Multiple Vitamin (MULTIVITAMIN) tablet Take 1 tablet by mouth daily.    . ondansetron (ZOFRAN) 4 MG tablet Take 1 tablet (4 mg total) by mouth every 8 (eight) hours as needed for nausea or  vomiting. 20 tablet 0  . vitamin B-12 (CYANOCOBALAMIN) 1000 MCG tablet Take 1 tablet (1,000 mcg total) by mouth daily. 90 tablet 0  . azithromycin (ZITHROMAX) 250 MG tablet 500 mg day 1, 250 mg QD (Patient not taking: Reported on 07/28/2015) 6 tablet 0   No facility-administered medications prior to visit.    Allergies  Allergen Reactions  . Augmentin [Amoxicillin-Pot Clavulanate] Nausea And Vomiting  . Oxycodone-Acetaminophen     REACTION: itching    ROS As per HPI  PE: Blood pressure 116/78, pulse 94, temperature 98 F (36.7 C), temperature source Oral, resp. rate 16, height '5\' 5"'$  (1.651 m), weight 143 lb 12 oz (65.205 kg), SpO2 98 %. Gen: Alert, well appearing.  Patient is oriented to person, place, time, and situation. AFFECT: pleasant, lucid thought  and speech. Foot exam - bilateral normal; no swelling, tenderness or skin or vascular lesions. Color and temperature is normal. Sensation is intact. Peripheral pulses are palpable. Toenails are long and thick.   LABS:  Lab Results  Component Value Date   TSH 1.71 03/31/2015   Lab Results  Component Value Date   WBC 12.1* 01/10/2015   HGB 15.1 01/10/2015   HCT 44.7 01/10/2015   MCV 91.6 01/10/2015   PLT 316.0 01/10/2015   Lab Results  Component Value Date   CREATININE 0.87 01/10/2015   BUN 11 01/10/2015   NA 130* 01/10/2015   K 4.9 01/10/2015   CL 92* 01/10/2015   CO2 31 01/10/2015   Lab Results  Component Value Date   ALT 16 01/10/2015   AST 17 01/10/2015   ALKPHOS 106 01/10/2015   BILITOT 0.6 01/10/2015   Lab Results  Component Value Date   CHOL 154 07/13/2013   Lab Results  Component Value Date   HDL 40.90 07/13/2013   Lab Results  Component Value Date   LDLCALC 99 07/13/2013   Lab Results  Component Value Date   TRIG 70.0 07/13/2013   Lab Results  Component Value Date   CHOLHDL 4 07/13/2013   Lab Results  Component Value Date   HGBA1C 7.5* 03/31/2015    IMPRESSION AND PLAN:  1) DM 2, good control as per home monitoring. Feet exam normal today.   Hba1c today.  2) Hx of hyponatremia: suspect amitriptyline effect. BMET today.  3) Hyperlipidemia: tolerating statin.  AST/ALT good 12/2014.  Not fasting today. Will repeat FLP at next f/u in 66mo(CPE).  4) Hypothyroidism: last TSH was normal after we increased his dose to 137 mcg qd. Recheck TSH today.  If normal today then will resume annual monitoring of TSH.  An After Visit Summary was printed and given to the patient.  FOLLOW UP: Return in about 4 months (around 11/27/2015) for annual CPE (fasting).  Signed:  PCrissie Sickles MD           07/28/2015

## 2015-07-28 NOTE — Progress Notes (Signed)
Pre visit review using our clinic review tool, if applicable. No additional management support is needed unless otherwise documented below in the visit note. 

## 2015-07-31 ENCOUNTER — Other Ambulatory Visit: Payer: Self-pay | Admitting: *Deleted

## 2015-07-31 MED ORDER — INSULIN PEN NEEDLE 32G X 4 MM MISC: Status: DC

## 2015-07-31 NOTE — Telephone Encounter (Signed)
RF request for BD urtra-fine pen ndl [id number] LOV: 07/28/15 Next ov: 11/02/15 Last written: 07/18/14 3100 w/ 11RF

## 2015-08-01 ENCOUNTER — Other Ambulatory Visit: Payer: Self-pay | Admitting: *Deleted

## 2015-08-01 MED ORDER — LEVOTHYROXINE SODIUM 137 MCG PO TABS: 125.0000 ug | ORAL_TABLET | Freq: Every day | ORAL | Status: DC

## 2015-08-01 NOTE — Telephone Encounter (Signed)
Pt called requesting refill.  RF request for levothyroxine (Brand Name only) LOV: 07/28/15 Next ov: 11/02/15 Last written: 04/21/15 #30 w/ 2RF  Rx sent. Pt advised and voiced understanding.

## 2015-08-24 ENCOUNTER — Other Ambulatory Visit: Payer: Self-pay | Admitting: *Deleted

## 2015-08-24 MED ORDER — INSULIN GLARGINE 100 UNIT/ML SOLOSTAR PEN: PEN_INJECTOR | SUBCUTANEOUS | Status: DC

## 2015-08-24 NOTE — Telephone Encounter (Signed)
Pt LMOM on 08/24/15 at 12:01pm requesting refill. Rx sent.   RF request for lantus LOV: 07/28/15 Next ov: 11/02/15 Last written: 11/23/14 35 w/ 11RF

## 2015-11-02 ENCOUNTER — Encounter: Payer: BLUE CROSS/BLUE SHIELD | Admitting: Family Medicine

## 2015-12-01 ENCOUNTER — Other Ambulatory Visit: Payer: Self-pay

## 2015-12-01 MED ORDER — AMITRIPTYLINE HCL 25 MG PO TABS: ORAL_TABLET | ORAL | 12 refills | Status: DC

## 2015-12-01 NOTE — Telephone Encounter (Signed)
Last seen 07/24/15

## 2016-01-11 ENCOUNTER — Other Ambulatory Visit: Payer: Self-pay | Admitting: *Deleted

## 2016-01-11 MED ORDER — INSULIN ASPART 100 UNIT/ML FLEXPEN: PEN_INJECTOR | SUBCUTANEOUS | 3 refills | Status: DC

## 2016-01-11 NOTE — Telephone Encounter (Signed)
RF request for Novolog LOV: 07/28/15 Next ov: None Last written: 11/23/14 #7015ml w/ 12RF

## 2016-05-15 ENCOUNTER — Encounter: Payer: Self-pay | Admitting: Family Medicine

## 2016-05-15 ENCOUNTER — Ambulatory Visit (INDEPENDENT_AMBULATORY_CARE_PROVIDER_SITE_OTHER): Payer: BLUE CROSS/BLUE SHIELD | Admitting: Family Medicine

## 2016-05-15 VITALS — BP 130/84 | HR 87 | Temp 98.3°F | Resp 16 | Ht 65.0 in | Wt 146.2 lb

## 2016-05-15 DIAGNOSIS — M19041 Primary osteoarthritis, right hand: Secondary | ICD-10-CM | POA: Diagnosis not present

## 2016-05-15 DIAGNOSIS — M19042 Primary osteoarthritis, left hand: Secondary | ICD-10-CM

## 2016-05-15 DIAGNOSIS — E039 Hypothyroidism, unspecified: Secondary | ICD-10-CM | POA: Diagnosis not present

## 2016-05-15 DIAGNOSIS — F17200 Nicotine dependence, unspecified, uncomplicated: Secondary | ICD-10-CM

## 2016-05-15 DIAGNOSIS — J0100 Acute maxillary sinusitis, unspecified: Secondary | ICD-10-CM

## 2016-05-15 DIAGNOSIS — M5416 Radiculopathy, lumbar region: Secondary | ICD-10-CM

## 2016-05-15 DIAGNOSIS — E119 Type 2 diabetes mellitus without complications: Secondary | ICD-10-CM | POA: Diagnosis not present

## 2016-05-15 DIAGNOSIS — E7800 Pure hypercholesterolemia, unspecified: Secondary | ICD-10-CM

## 2016-05-15 MED ORDER — AMITRIPTYLINE HCL 25 MG PO TABS: ORAL_TABLET | ORAL | 6 refills | Status: DC

## 2016-05-15 MED ORDER — FLUTICASONE PROPIONATE 50 MCG/ACT NA SUSP: 2.0000 | Freq: Every day | NASAL | 6 refills | Status: DC

## 2016-05-15 MED ORDER — AZITHROMYCIN 250 MG PO TABS: ORAL_TABLET | ORAL | 0 refills | Status: DC

## 2016-05-15 NOTE — Progress Notes (Signed)
OFFICE VISIT  05/15/2016   CC:  Chief Complaint  Patient presents with  . Follow-up    RCI,    HPI:    Patient is a 51 y.o. Caucasian male who presents for f/u DM 2, HLD, and hypothyroidism. I last saw him 10 months ago.  He lost his insurance recently and had to lower insulin dosing to make it last longer. Has not been taking the atorvastatin or flonase.  He has started smoking again: 1 pack/day.  DM 2: only occ glucose check "up and down".  Continued 10 U lantus qhs, 6 U novolog with meals. Occ reading in 40s-50s when he doesn't eat evening snack.  Highest he has seen fasting has been 120.  Up to 300 in afternoons lately.  Some extra stress lately.  HLD: has not been taking his statin due to financial issues.  Hypothyroidism: taking levothyroxine daily.  Sinuses "driving him crazy".  Onset 6 days ago, getting worse.  Taking sudafed and not helping. No nasal spray.  Pain in L maxillary region.  No fevers.  Some PND cough.  No wheezing or SOB.  He also c/o intermittent swelling of both hands/fingers lately.  The joints and soft tissues per his and his wife's report. No redness.  Some stiffness but no pain.    Says chronic R leg radicular pain is worse lately.  No leg weakness.  No recent injury.  No back pain.  Past Medical History:  Diagnosis Date  . Back pain    s/p back surgery  . Chronic hyponatremia 08/2012 onset   Onset was right after starting amitriptyline for his neuropathic leg pain--has been stable.  Likely ADH-like effect on kidney from amitriptyline.  . Diabetes mellitus, type 2 ([place]) 08/2012--dx'd  . History of low back pain 06/24/2011  . Hyperlipidemia   . Hypothyroidism   . Migraines   . Neuropathic pain of right lower extremity   . PLANTAR WART, RIGHT 10/09/2009  . Tobacco dependence in remission    quit 2012    Past Surgical History:  Procedure Laterality Date  . BACK SURGERY  2009   lower lumbar fusion- rubber disc in back  . CARDIOVASCULAR STRESS  TEST  03/21/14   No ischemia/low risk study; however, LV function showed EF 49% and global LV hypokinesis--echo recommended and it showed normal LV fxn.  Marland Kitchen NASAL SINUS SURGERY  2007  . TRANSTHORACIC ECHOCARDIOGRAM  03/24/14   Normal LV fxn, EF 50-55%, no wall motion abnormalities: (grade 1 diast dysfxn)    Outpatient Medications Prior to Visit  Medication Sig Dispense Refill  . aspirin 81 MG tablet Take 1 tablet (81 mg total) by mouth daily. 30 tablet 0  . insulin aspart (NOVOLOG FLEXPEN) 100 UNIT/ML FlexPen 5 U SQ q AC 15 mL 3  . Insulin Glargine (LANTUS SOLOSTAR) 100 UNIT/ML Solostar Pen 10 U SQ qhs 5 pen 11  . levothyroxine (SYNTHROID, LEVOTHROID) 137 MCG tablet Take 1 tablet (137 mcg total) by mouth daily before breakfast. 30 tablet 11  . amitriptyline (ELAVIL) 25 MG tablet 3 tabs po qhs for R leg pain 90 tablet 12  . atorvastatin (LIPITOR) 20 MG tablet Take 1 tablet (20 mg total) by mouth daily. (Patient not taking: Reported on 05/15/2016) 90 tablet 3  . Blood Glucose Monitoring Suppl (ONE TOUCH ULTRA SYSTEM KIT) W/DEVICE KIT Use as directed to monitor glucose (Patient not taking: Reported on 05/15/2016) 1 each 0  . gabapentin (NEURONTIN) 100 MG capsule 1-3 tabs po bid as  needed for R leg pain (Patient not taking: Reported on 05/15/2016) 180 capsule 3  . glucose blood (FREESTYLE INSULINX TEST) test strip Use as instructed (Patient not taking: Reported on 05/15/2016) 50 each 6  . ibuprofen (ADVIL,MOTRIN) 200 MG tablet Take 200 mg by mouth every 6 (six) hours as needed.    . Insulin Pen Needle (INSUPEN PEN NEEDLES) 32G X 4 MM MISC (BD Ultra-fine needles) Use 4 times daily as needed.  Dx:E11.65 (Patient not taking: Reported on 05/15/2016) 100 each 11  . Lancets (FREESTYLE) lancets Use as instructed (Patient not taking: Reported on 05/15/2016) 100 each 6  . Multiple Vitamin (MULTIVITAMIN) tablet Take 1 tablet by mouth daily.    . ondansetron (ZOFRAN) 4 MG tablet Take 1 tablet (4 mg total) by mouth every 8  (eight) hours as needed for nausea or vomiting. (Patient not taking: Reported on 05/15/2016) 20 tablet 0  . vitamin B-12 (CYANOCOBALAMIN) 1000 MCG tablet Take 1 tablet (1,000 mcg total) by mouth daily. (Patient not taking: Reported on 05/15/2016) 90 tablet 0  . fluticasone (FLONASE) 50 MCG/ACT nasal spray Place 2 sprays into both nostrils daily. (Patient not taking: Reported on 05/15/2016) 16 g 6   No facility-administered medications prior to visit.     Allergies  Allergen Reactions  . Augmentin [Amoxicillin-Pot Clavulanate] Nausea And Vomiting  . Oxycodone-Acetaminophen     REACTION: itching  . Levothyroxine Rash    Had rash associated with generic levothyroxine, but not brand-name Synthroid.  Likely allergic to a dye or filler in the generic preparation.    ROS As per HPI  PE: Blood pressure 130/84, pulse 87, temperature 98.3 F (36.8 C), temperature source Oral, resp. rate 16, height _0  (1.651 m), weight 146 lb 4 oz (66.3 kg), SpO2 97 %. Gen: Alert, well appearing.  Patient is oriented to person, place, time, and situation. AFFECT: pleasant, lucid thought and speech. HEENT: eyes without injection, drainage, or swelling.  Ears: EACs clear, TMs with normal light reflex and landmarks.  Nose: Clear rhinorrhea, with some dried, crusty exudate adherent to mildly injected mucosa.  No purulent d/c.  L>R paranasal sinus TTP.  No facial swelling.  Throat and mouth without focal lesion.  No pharyngial swelling, erythema, or exudate.  Yellow PND noted. Neck: supple, no LAD or TM.   LUNGS: CTA bilat, nonlabored resps.   CV: RRR, no m/r/g. EXT: no c/c/e SKIN: no rash HANDS: all finger joints are mildly hypertrophic, otherwise no deformity.  No erythema, warmth, or tenderness of hands/fingers.  Color of hands/fingers is pink.  Skin of hands very dry, some cracking. No signif soft tissue swelling of hands/fingers.   LABS:  Lab Results  Component Value Date   TSH 1.50 07/28/2015   Lab Results   Component Value Date   WBC 12.1 (H) 01/10/2015   HGB 15.1 01/10/2015   HCT 44.7 01/10/2015   MCV 91.6 01/10/2015   PLT 316.0 01/10/2015   Lab Results  Component Value Date   CREATININE 0.96 07/28/2015   BUN 17 07/28/2015   NA 129 (L) 07/28/2015   K 4.4 07/28/2015   CL 94 (L) 07/28/2015   CO2 29 07/28/2015   Lab Results  Component Value Date   ALT 16 01/10/2015   AST 17 01/10/2015   ALKPHOS 106 01/10/2015   BILITOT 0.6 01/10/2015   Lab Results  Component Value Date   CHOL 154 07/13/2013   Lab Results  Component Value Date   HDL 40.90 07/13/2013   Lab  Results  Component Value Date   LDLCALC 99 07/13/2013   Lab Results  Component Value Date   TRIG 70.0 07/13/2013   Lab Results  Component Value Date   CHOLHDL 4 07/13/2013   Lab Results  Component Value Date   HGBA1C 7.2 (H) 07/28/2015    IMPRESSION AND PLAN:  1) DM 2; historically control has been pretty good. Of late, he is worried about some low glucoses and some high glucoses that he feels are not explainable. He requests referral to Dr. Steffanie Dunn, endocrinologist, whom he says he has seen before (for hypothyroidism?--pt unsure).  I ordered this referral today per pt's request. Check Hba1c and urine microalbumin/cr today.  CMET today. His eye exam needs updating. No change in insulin dosing made today.  2) Hyperlipidemia: not on statin anymore due to financial constraints.  Not fasting for lipid panel check today. He is not able to afford a restart of atorvastatin at this time.  3) Hypothyroidism: recheck TSH today.  4) Acute sinusitis: Z-pack rx'd, advised pt to get back on flonase.  5) Chronic R leg radicular pain; this is a remnant of lumbar spondylosis with radiculopathy--he is s/p surgery of L spine. This was significantly improved on amitriptyline 2-3 tabs qhs in the past.  Now taking three 25 mg pills in evening--not well controlled.  Will start a 25 mg dose in the mornings, continue 3 tabs  qhs.  6) Bilat hands/fingers osteoarthritis: no pain, just some stiffness and swelling. No meds rx'd at this time.  7) Tobacco dependence: encouraged pt to completely quit.  An After Visit Summary was printed and given to the patient.  FOLLOW UP: Return in about 6 months (around 11/14/2016) for annual CPE (fasting).  Signed:  Crissie Sickles, MD           05/15/2016

## 2016-05-15 NOTE — Progress Notes (Signed)
Pre visit review using our clinic review tool, if applicable. No additional management support is needed unless otherwise documented below in the visit note. 

## 2016-05-16 LAB — COMPREHENSIVE METABOLIC PANEL
ALBUMIN: 4.3 g/dL (ref 3.5–5.2)
ALT: 11 U/L (ref 0–53)
AST: 15 U/L (ref 0–37)
Alkaline Phosphatase: 131 U/L — ABNORMAL HIGH (ref 39–117)
BILIRUBIN TOTAL: 0.4 mg/dL (ref 0.2–1.2)
BUN: 16 mg/dL (ref 6–23)
CALCIUM: 9.3 mg/dL (ref 8.4–10.5)
CO2: 30 meq/L (ref 19–32)
Chloride: 94 mEq/L — ABNORMAL LOW (ref 96–112)
Creatinine, Ser: 1.02 mg/dL (ref 0.40–1.50)
GFR: 81.73 mL/min (ref >60.00)
Glucose, Bld: 84 mg/dL (ref 70–99)
Potassium: 4.5 mEq/L (ref 3.5–5.1)
Sodium: 128 mEq/L — ABNORMAL LOW (ref 135–145)
Total Protein: 7.2 g/dL (ref 6.0–8.3)

## 2016-05-16 LAB — TSH: TSH: 7.31 u[IU]/mL — ABNORMAL HIGH (ref 0.35–4.50)

## 2016-05-16 LAB — MICROALBUMIN / CREATININE URINE RATIO
Creatinine,U: 64.6 mg/dL
MICROALB UR: 3.1 mg/dL — AB (ref 0.0–1.9)
Microalb Creat Ratio: 4.8 mg/g (ref 0.0–30.0)

## 2016-05-16 LAB — HEMOGLOBIN A1C: Hgb A1c MFr Bld: 7.4 % — ABNORMAL HIGH (ref 4.6–6.5)

## 2016-05-17 ENCOUNTER — Other Ambulatory Visit: Payer: Self-pay | Admitting: *Deleted

## 2016-05-17 DIAGNOSIS — E039 Hypothyroidism, unspecified: Secondary | ICD-10-CM

## 2016-06-03 ENCOUNTER — Encounter: Payer: Self-pay | Admitting: Family Medicine

## 2016-06-04 ENCOUNTER — Telehealth: Payer: Self-pay | Admitting: Family Medicine

## 2016-06-04 NOTE — Telephone Encounter (Signed)
Before I order this referral, tell him that when I referred him there (Dr. Tonna Boehringer) after his most recent visit with me, they tried to contact him 5 times and got no response.  Make sure we have a phone number that he is going to answer, then I'll order the referral again.-thx

## 2016-06-04 NOTE — Telephone Encounter (Signed)
Patient requesting referral for Endocrinology, Dr Lalla Brothers in [place].   Please advise.

## 2016-06-05 NOTE — Telephone Encounter (Signed)
I called the patient & gave him the phone number for [facility] [phone]. They are waiting for him to CB to schedule. No need for another referral

## 2016-07-10 DIAGNOSIS — E11649 Type 2 diabetes mellitus with hypoglycemia without coma: Secondary | ICD-10-CM | POA: Diagnosis not present

## 2016-07-10 DIAGNOSIS — E039 Hypothyroidism, unspecified: Secondary | ICD-10-CM | POA: Diagnosis not present

## 2016-07-10 DIAGNOSIS — Z794 Long term (current) use of insulin: Secondary | ICD-10-CM | POA: Diagnosis not present

## 2016-08-15 DIAGNOSIS — E109 Type 1 diabetes mellitus without complications: Secondary | ICD-10-CM | POA: Diagnosis not present

## 2016-08-15 DIAGNOSIS — E039 Hypothyroidism, unspecified: Secondary | ICD-10-CM | POA: Diagnosis not present

## 2016-08-18 ENCOUNTER — Other Ambulatory Visit: Payer: Self-pay | Admitting: Family Medicine

## 2016-08-18 MED ORDER — ATORVASTATIN CALCIUM 20 MG PO TABS: 20.0000 mg | ORAL_TABLET | Freq: Every day | ORAL | 1 refills | Status: DC

## 2016-09-02 DIAGNOSIS — E10649 Type 1 diabetes mellitus with hypoglycemia without coma: Secondary | ICD-10-CM | POA: Diagnosis not present

## 2016-09-02 DIAGNOSIS — Z794 Long term (current) use of insulin: Secondary | ICD-10-CM | POA: Diagnosis not present

## 2016-09-02 DIAGNOSIS — Z713 Dietary counseling and surveillance: Secondary | ICD-10-CM | POA: Diagnosis not present

## 2016-09-09 ENCOUNTER — Other Ambulatory Visit: Payer: Self-pay | Admitting: *Deleted

## 2016-09-09 MED ORDER — INSULIN GLARGINE 100 UNIT/ML SOLOSTAR PEN: PEN_INJECTOR | SUBCUTANEOUS | 6 refills | Status: DC

## 2016-09-09 MED ORDER — INSULIN PEN NEEDLE 32G X 4 MM MISC: 11 refills | Status: DC

## 2016-09-09 NOTE — Telephone Encounter (Signed)
[facility]  RF request for lantus LOV: 05/15/16 Next ov: 11/14/16 Last written: 08/24/15 #[id number] w/ 11RF

## 2016-09-24 DIAGNOSIS — E039 Hypothyroidism, unspecified: Secondary | ICD-10-CM | POA: Diagnosis not present

## 2016-09-24 DIAGNOSIS — E871 Hypo-osmolality and hyponatremia: Secondary | ICD-10-CM | POA: Diagnosis not present

## 2016-09-24 DIAGNOSIS — E109 Type 1 diabetes mellitus without complications: Secondary | ICD-10-CM | POA: Diagnosis not present

## 2016-10-09 ENCOUNTER — Other Ambulatory Visit: Payer: Self-pay | Admitting: Family Medicine

## 2016-11-14 ENCOUNTER — Ambulatory Visit (INDEPENDENT_AMBULATORY_CARE_PROVIDER_SITE_OTHER): Payer: BLUE CROSS/BLUE SHIELD | Admitting: Family Medicine

## 2016-11-14 ENCOUNTER — Encounter: Payer: Self-pay | Admitting: Family Medicine

## 2016-11-14 VITALS — BP 112/74 | HR 101 | Temp 98.3°F | Resp 16 | Ht 64.75 in | Wt 143.5 lb

## 2016-11-14 DIAGNOSIS — J301 Allergic rhinitis due to pollen: Secondary | ICD-10-CM

## 2016-11-14 DIAGNOSIS — Z0000 Encounter for general adult medical examination without abnormal findings: Secondary | ICD-10-CM | POA: Diagnosis not present

## 2016-11-14 DIAGNOSIS — Z23 Encounter for immunization: Secondary | ICD-10-CM | POA: Diagnosis not present

## 2016-11-14 DIAGNOSIS — Z1211 Encounter for screening for malignant neoplasm of colon: Secondary | ICD-10-CM

## 2016-11-14 DIAGNOSIS — Z125 Encounter for screening for malignant neoplasm of prostate: Secondary | ICD-10-CM

## 2016-11-14 LAB — CBC WITH DIFFERENTIAL/PLATELET
BASOS PCT: 0.9 % (ref 0.0–3.0)
Basophils Absolute: 0.1 10*3/uL (ref 0.0–0.1)
EOS ABS: 0.3 10*3/uL (ref 0.0–0.7)
Eosinophils Relative: 2.8 % (ref 0.0–5.0)
HEMATOCRIT: 46.4 % (ref 39.0–52.0)
Hemoglobin: 15.6 g/dL (ref 13.0–17.0)
LYMPHS ABS: 2.3 10*3/uL (ref 0.7–4.0)
Lymphocytes Relative: 18.2 % (ref 12.0–46.0)
MCHC: 33.6 g/dL (ref 30.0–36.0)
MCV: 92 fl (ref 78.0–100.0)
MONO ABS: 1.1 10*3/uL — AB (ref 0.1–1.0)
Monocytes Relative: 9.1 % (ref 3.0–12.0)
NEUTROS ABS: 8.6 10*3/uL — AB (ref 1.4–7.7)
NEUTROS PCT: 69 % (ref 43.0–77.0)
PLATELETS: 315 10*3/uL (ref 150.0–400.0)
RBC: 5.04 Mil/uL (ref 4.22–5.81)
RDW: 14.2 % (ref 11.5–15.5)
WBC: 12.4 10*3/uL — ABNORMAL HIGH (ref 4.0–10.5)

## 2016-11-14 LAB — LIPID PANEL
CHOL/HDL RATIO: 2
CHOLESTEROL: 165 mg/dL (ref 0–200)
HDL: 69.3 mg/dL (ref >39.00)
LDL Cholesterol: 83 mg/dL (ref 0–99)
NonHDL: 95.4
TRIGLYCERIDES: 61 mg/dL (ref 0.0–149.0)
VLDL: 12.2 mg/dL (ref 0.0–40.0)

## 2016-11-14 LAB — COMPREHENSIVE METABOLIC PANEL
ALT: 14 U/L (ref 0–53)
AST: 13 U/L (ref 0–37)
Albumin: 4.5 g/dL (ref 3.5–5.2)
Alkaline Phosphatase: 116 U/L (ref 39–117)
BILIRUBIN TOTAL: 1 mg/dL (ref 0.2–1.2)
BUN: 10 mg/dL (ref 6–23)
CALCIUM: 9.8 mg/dL (ref 8.4–10.5)
CO2: 30 meq/L (ref 19–32)
CREATININE: 0.87 mg/dL (ref 0.40–1.50)
Chloride: 88 mEq/L — ABNORMAL LOW (ref 96–112)
GFR: 98 mL/min (ref >60.00)
Glucose, Bld: 169 mg/dL — ABNORMAL HIGH (ref 70–99)
Potassium: 5.3 mEq/L — ABNORMAL HIGH (ref 3.5–5.1)
Sodium: 126 mEq/L — ABNORMAL LOW (ref 135–145)
Total Protein: 7.5 g/dL (ref 6.0–8.3)

## 2016-11-14 LAB — PSA: PSA: 0.26 ng/mL (ref 0.10–4.00)

## 2016-11-14 MED ORDER — AZELASTINE HCL 0.15 % NA SOLN: NASAL | 11 refills | Status: DC

## 2016-11-14 NOTE — Progress Notes (Signed)
Office Note 11/14/2016  CC:  Chief Complaint  Patient presents with  . Annual Exam    Pt is fasting.     HPI:  Daniel Stevenson is a 51 y.o. White male who is here for annual health maintenance exam. Pt's diabetes and hypothyroidism are managed by Dr. Steffanie Dunn, endocrinologist.  Eyes: overdue for exam (none in last 2 yrs) due to cost. Dental: has dentures. Exercise: walking + work. Diet: working on eating more frequent, small meals as per Dr. Steffanie Dunn.  Complains of about 2 mo of worsening nasal congestion over his baseline.  No facial pain. Some HA's but this is not really new for him.  No fevers, no purulent mucous.  R nasal passage more congested than L. Snores worse lately but no apneic periods witnessed.  No excessive daytime sleepiness or fatigue. No cough, no wheezing.   Past Medical History:  Diagnosis Date  . Back pain    s/p back surgery  . Chronic hyponatremia 08/2012 onset   Onset was right after starting amitriptyline for his neuropathic leg pain--has been stable.  Likely ADH-like effect on kidney from amitriptyline.  Marland Kitchen History of low back pain 06/24/2011  . Hyperlipidemia   . Hypothyroidism   . 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 Steffanie Dunn.  . Migraines   . Neuropathic pain of right lower extremity   . PLANTAR WART, RIGHT 10/09/2009  . Tobacco dependence    quit 2012; restarted not long after    Past Surgical History:  Procedure Laterality Date  . BACK SURGERY  2009   lower lumbar fusion- rubber disc in back  . CARDIOVASCULAR STRESS TEST  03/21/14   No ischemia/low risk study; however, LV function showed EF 49% and global LV hypokinesis--echo recommended and it showed normal LV fxn.  Marland Kitchen NASAL SINUS SURGERY  2007  . TRANSTHORACIC ECHOCARDIOGRAM  03/24/14   Normal LV fxn, EF 50-55%, no wall motion abnormalities: (grade 1 diast dysfxn)    Family History  Problem Relation Age of Onset  . Emphysema Mother      smoker  . Obesity Father     Social History   Social History  . Marital status: Married    Spouse name: N/A  . Number of children: N/A  . Years of education: N/A   Occupational History  . Not on file.   Social History Main Topics  . Smoking status: Current Every Day Smoker    Packs/day: 1.00    Years: 35.00    Types: Cigarettes    Last attempt to quit: 12/19/2010  . Smokeless tobacco: Never Used  . Alcohol use Yes     Comment: rarely  . Drug use: No  . Sexual activity: No   Other Topics Concern  . Not on file   Social History Narrative   Married, 1 biologic child, 3 step children (all grown).   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   Smoker: 1-2 ppd x 17 yrs.  Rare ETOH, no drugs.   No exercise.             Outpatient Medications Prior to Visit  Medication Sig Dispense Refill  . amitriptyline (ELAVIL) 25 MG tablet 1 tab po qAM and 3 tabs po qhs for R leg pain 120 tablet 6  . aspirin 81 MG tablet Take 1 tablet (81 mg total) by mouth daily. 30 tablet 0  . atorvastatin (LIPITOR) 20 MG tablet Take  1 tablet (20 mg total) by mouth daily. 90 tablet 1  . fluticasone (FLONASE) 50 MCG/ACT nasal spray Place 2 sprays into both nostrils daily. 16 g 6  . ibuprofen (ADVIL,MOTRIN) 200 MG tablet Take 200 mg by mouth every 6 (six) hours as needed.    . insulin aspart (NOVOLOG FLEXPEN) 100 UNIT/ML FlexPen 5 U SQ q AC 15 mL 3  . Insulin Glargine (LANTUS SOLOSTAR) 100 UNIT/ML Solostar Pen 10 U SQ qhs 5 pen 6  . Insulin Pen Needle (INSUPEN PEN NEEDLES) 32G X 4 MM MISC (BD Ultra-fine needles) Use 4 times daily as needed.  Dx:E11.65 100 each 11  . Multiple Vitamin (MULTIVITAMIN) tablet Take 1 tablet by mouth daily.    . vitamin B-12 (CYANOCOBALAMIN) 1000 MCG tablet Take 1 tablet (1,000 mcg total) by mouth daily. 90 tablet 0  . azithromycin (ZITHROMAX) 250 MG tablet 2 tabs po qd x 1d, then 1 tab po qd x 4d (Patient not taking: Reported on 11/14/2016) 6  tablet 0  . Blood Glucose Monitoring Suppl (ONE TOUCH ULTRA SYSTEM KIT) W/DEVICE KIT Use as directed to monitor glucose (Patient not taking: Reported on 11/14/2016) 1 each 0  . gabapentin (NEURONTIN) 100 MG capsule 1-3 tabs po bid as needed for R leg pain (Patient not taking: Reported on 05/15/2016) 180 capsule 3  . glucose blood (FREESTYLE INSULINX TEST) test strip Use as instructed (Patient not taking: Reported on 11/14/2016) 50 each 6  . Lancets (FREESTYLE) lancets Use as instructed (Patient not taking: Reported on 05/15/2016) 100 each 6  . levothyroxine (SYNTHROID, LEVOTHROID) 137 MCG tablet Take 1 tablet (137 mcg total) by mouth daily before breakfast. (Patient not taking: Reported on 11/14/2016) 30 tablet 11  . ondansetron (ZOFRAN) 4 MG tablet Take 1 tablet (4 mg total) by mouth every 8 (eight) hours as needed for nausea or vomiting. (Patient not taking: Reported on 05/15/2016) 20 tablet 0   No facility-administered medications prior to visit.     Allergies  Allergen Reactions  . Augmentin [Amoxicillin-Pot Clavulanate] Nausea And Vomiting  . Oxycodone-Acetaminophen     REACTION: itching  . Levothyroxine Rash    Had rash associated with generic levothyroxine, but not brand-name Synthroid.  Likely allergic to a dye or filler in the generic preparation.    ROS Review of Systems  Constitutional: Negative for appetite change, chills, fatigue and fever.  HENT: Positive for congestion (nasal). Negative for dental problem, ear pain and sore throat.   Eyes: Negative for discharge, redness and visual disturbance.  Respiratory: Negative for cough, chest tightness, shortness of breath and wheezing.   Cardiovascular: Negative for chest pain, palpitations and leg swelling.  Gastrointestinal: Negative for abdominal pain, blood in stool, diarrhea, nausea and vomiting.  Genitourinary: Negative for difficulty urinating, dysuria, flank pain, frequency, hematuria and urgency.  Musculoskeletal: Negative for  arthralgias, back pain, joint swelling, myalgias and neck stiffness.  Skin: Negative for pallor and rash.  Neurological: Negative for dizziness, speech difficulty, weakness and headaches.  Hematological: Negative for adenopathy. Does not bruise/bleed easily.  Psychiatric/Behavioral: Negative for confusion and sleep disturbance. The patient is not nervous/anxious.     PE; Blood pressure 112/74, pulse (!) 101, temperature 98.3 F (36.8 C), temperature source Oral, resp. rate 16, height 5' 4.75" (1.645 m), weight 143 lb 8 oz (65.1 kg), SpO2 98 %. Gen: Alert, well appearing.  Patient is oriented to person, place, time, and situation. AFFECT: pleasant, lucid thought and speech. ENT: Ears: EACs clear, normal epithelium.  TMs with  good light reflex and landmarks bilaterally.  Eyes: no injection, icteris, swelling, or exudate.  EOMI, PERRLA. Nose: no drainage or turbinate edema/swelling.  Scant amount of dried mucous in nares, minimal mucosal injection.  No focal lesion.  Mouth: lips without lesion/swelling.  Oral mucosa pink and moist.  Dentition intact and without obvious caries or gingival swelling.  Oropharynx without erythema, exudate, or swelling.  Neck: supple/nontender.  No LAD, mass, or TM.  Carotid pulses 2+ bilaterally, without bruits. CV: RRR, no m/r/g.   LUNGS: CTA bilat, nonlabored resps, good aeration in all lung fields. ABD: soft, NT, ND, BS normal.  No hepatospenomegaly or mass.  No bruits. EXT: no clubbing, cyanosis, or edema.  Musculoskeletal: no joint swelling, erythema, warmth, or tenderness.  ROM of all joints intact. Skin - no sores or suspicious lesions or rashes or color changes Rectal exam: negative without mass, lesions or tenderness, PROSTATE EXAM: smooth and symmetric without nodules or tenderness.   Pertinent labs:  Lab Results  Component Value Date   TSH 7.31 (H) 05/15/2016   Lab Results  Component Value Date   WBC 12.1 (H) 01/10/2015   HGB 15.1 01/10/2015    HCT 44.7 01/10/2015   MCV 91.6 01/10/2015   PLT 316.0 01/10/2015   Lab Results  Component Value Date   CREATININE 1.02 05/15/2016   BUN 16 05/15/2016   NA 128 (L) 05/15/2016   K 4.5 05/15/2016   CL 94 (L) 05/15/2016   CO2 30 05/15/2016   Lab Results  Component Value Date   ALT 11 05/15/2016   AST 15 05/15/2016   ALKPHOS 131 (H) 05/15/2016   BILITOT 0.4 05/15/2016   Lab Results  Component Value Date   CHOL 154 07/13/2013   Lab Results  Component Value Date   HDL 40.90 07/13/2013   Lab Results  Component Value Date   LDLCALC 99 07/13/2013   Lab Results  Component Value Date   TRIG 70.0 07/13/2013   Lab Results  Component Value Date   CHOLHDL 4 07/13/2013   Lab Results  Component Value Date   HGBA1C 7.4 (H) 05/15/2016    ASSESSMENT AND PLAN:   1) Seasonal allergic rhinitis: no sign of sinus infection. Add astelin to daily flonase. Therapeutic expectations and side effect profile of medication discussed today.  Patient's questions answered.   2) Health maintenance exam: Reviewed age and gender appropriate health maintenance issues (prudent diet, regular exercise, health risks of tobacco and excessive alcohol, use of seatbelts, fire alarms in home, use of sunscreen).  Also reviewed age and gender appropriate health screening as well as vaccine recommendations. Vaccines: UTD.  Flu vaccine--given today. Labs: CBC, CMET, FLP.  (HbA1c and TSH followed by endocrinologist). Prostate ca screening: DRE normal today , PSA today. Colon ca screening: has never had colonoscopy--referral to GI for screening colonoscopy ordered today.  An After Visit Summary was printed and given to the patient.  FOLLOW UP:  Return in about 1 year (around 11/14/2017) for annual CPE (fasting).  Signed:  Crissie Sickles, MD           11/14/2016

## 2016-11-14 NOTE — Patient Instructions (Signed)

## 2016-11-29 ENCOUNTER — Other Ambulatory Visit: Payer: Self-pay | Admitting: Family Medicine

## 2016-12-02 NOTE — Telephone Encounter (Signed)
[facility]  RF request for amitriptyline LOV: 11/14/16 Next ov: 11/18/17 Last written: 05/15/16 #120 w/ 6RF  Please advise. Thanks.

## 2016-12-06 ENCOUNTER — Other Ambulatory Visit: Payer: Self-pay | Admitting: Family Medicine

## 2016-12-11 ENCOUNTER — Encounter: Payer: Self-pay | Admitting: Family Medicine

## 2017-01-15 ENCOUNTER — Encounter: Payer: Self-pay | Admitting: Family Medicine

## 2017-01-17 ENCOUNTER — Ambulatory Visit: Payer: Self-pay

## 2017-01-17 ENCOUNTER — Ambulatory Visit: Payer: BLUE CROSS/BLUE SHIELD | Admitting: Family Medicine

## 2017-01-17 ENCOUNTER — Encounter: Payer: Self-pay | Admitting: Family Medicine

## 2017-01-17 VITALS — BP 137/90 | HR 87 | Temp 98.6°F | Wt 142.0 lb

## 2017-01-17 DIAGNOSIS — J0110 Acute frontal sinusitis, unspecified: Secondary | ICD-10-CM

## 2017-01-17 MED ORDER — METHYLPREDNISOLONE ACETATE 80 MG/ML IJ SUSP
80.0000 mg | Freq: Once | INTRAMUSCULAR | Status: AC
  Administered 2017-01-17: 80 mg via INTRAMUSCULAR

## 2017-01-17 MED ORDER — DOXYCYCLINE HYCLATE 100 MG PO TABS: 100.0000 mg | ORAL_TABLET | Freq: Two times a day (BID) | ORAL | 0 refills | Status: DC

## 2017-01-17 MED ORDER — BENZONATATE 100 MG PO CAPS: 100.0000 mg | ORAL_CAPSULE | Freq: Two times a day (BID) | ORAL | 0 refills | Status: DC | PRN

## 2017-01-17 MED ORDER — PREDNISONE 50 MG PO TABS: 50.0000 mg | ORAL_TABLET | Freq: Every day | ORAL | 0 refills | Status: DC

## 2017-01-17 NOTE — Telephone Encounter (Signed)
Appointment made for today.  Reason for Disposition . SEVERE coughing spells (e.g., whooping sound after coughing, vomiting after coughing)  Answer Assessment - Initial Assessment Questions 1. ONSET: "When did the cough begin?"      Started Monday 2. SEVERITY: "How bad is the cough today?"      Severe 3. RESPIRATORY DISTRESS: "Describe your breathing."      No, breathing is good. 4. FEVER: "Do you have a fever?" If so, ask: "What is your temperature, how was it measured, and when did it start?"     No, but having chills and periods of being hot. 5. HEMOPTYSIS: "Are you coughing up any blood?" If so ask: "How much?" (flecks, streaks, tablespoons, etc.)     No 6. TREATMENT: "What have you done so far to treat the cough?" (e.g., meds, fluids, humidifier)     OTC medication 7. CARDIAC HISTORY: "Do you have any history of heart disease?" (e.g., heart attack, congestive heart failure)      No 8. LUNG HISTORY: "Do you have any history of lung disease?"  (e.g., pulmonary embolus, asthma, emphysema)     No 9. PE RISK FACTORS: "Do you have a history of blood clots?" (or: recent major surgery, recent prolonged travel, bedridden )     No 10. OTHER SYMPTOMS: "Do you have any other symptoms? (e.g., runny nose, wheezing, chest pain)       Runny nose - with green mucous. 11. PREGNANCY: "Is there any chance you are pregnant?" "When was your last menstrual period?"       No 12. TRAVEL: "Have you traveled out of the country in the last month?" (e.g., travel history, exposures)       No  Protocols used: COUGH - ACUTE NON-PRODUCTIVE-A-AH  States chest hurts when coughing and feels tight.

## 2017-01-17 NOTE — Progress Notes (Signed)
Daniel Stevenson , 08-22-1965, 51 y.o., male MRN: [id number] Patient Care Team    Relationship Specialty Notifications Start End  McGowen, Maryjean MornPhilip H, MD PCP - General Family Medicine  11/20/10   Warden FillersLambeth, John, MD Consulting Physician Endocrinology  07/10/16     Chief Complaint  Patient presents with  . URI    Chest tightness and congestion     Subjective: Pt presents for an OV with complaints of chest tightness and congestion of 8 days duration.  Associated symptoms include cough, headache, chills, fever, nausea, increase phlegm, decreased appetite and sinus pain. Pt has tried Nyquil /dayquil and sudafed to ease their symptoms. He has had his flu shot.    Depression screen PHQ 2/9 11/14/2016  Decreased Interest 0  Down, Depressed, Hopeless 0  PHQ - 2 Score 0    Allergies  Allergen Reactions  . Augmentin [Amoxicillin-Pot Clavulanate] Nausea And Vomiting  . Oxycodone-Acetaminophen     REACTION: itching  . Levothyroxine Rash    Had rash associated with generic levothyroxine, but not brand-name Synthroid.  Likely allergic to a dye or filler in the generic preparation.   Social History   Tobacco Use  . Smoking status: Current Every Day Smoker    Packs/day: 1.00    Years: 35.00    Pack years: 35.00    Types: Cigarettes    Last attempt to quit: 12/19/2010    Years since quitting: 6.0  . Smokeless tobacco: Never Used  Substance Use Topics  . Alcohol use: Yes    Comment: rarely   Past Medical History:  Diagnosis Date  . Back pain    s/p back surgery  . Chronic hyponatremia 08/2012 onset   Onset was right after starting amitriptyline for his neuropathic leg pain--has been stable.  Likely ADH-like effect on kidney from amitriptyline.  Marland Kitchen. History of low back pain 06/24/2011  . Hyperlipidemia   . Hypothyroidism   . LADA (latent autoimmune diabetes in adults), managed as type 1 (HCC) Dx'd 2014   Dr. Morrison OldLambeth.  . Migraines   . Neuropathic pain of right lower extremity   .  PLANTAR WART, RIGHT 10/09/2009  . Tobacco dependence    quit 2012; restarted not long after   Past Surgical History:  Procedure Laterality Date  . BACK SURGERY  2009   lower lumbar fusion- rubber disc in back  . CARDIOVASCULAR STRESS TEST  03/21/14   No ischemia/low risk study; however, LV function showed EF 49% and global LV hypokinesis--echo recommended and it showed normal LV fxn.  Marland Kitchen. NASAL SINUS SURGERY  2007  . TRANSTHORACIC ECHOCARDIOGRAM  03/24/14   Normal LV fxn, EF 50-55%, no wall motion abnormalities: (grade 1 diast dysfxn)   Family History  Problem Relation Age of Onset  . Emphysema Mother        smoker  . Obesity Father    Allergies as of 01/17/2017      Reactions   Augmentin [amoxicillin-pot Clavulanate] Nausea And Vomiting   Oxycodone-acetaminophen    REACTION: itching   Levothyroxine Rash   Had rash associated with generic levothyroxine, but not brand-name Synthroid.  Likely allergic to a dye or filler in the generic preparation.      Medication List        Accurate as of 01/17/17 10:52 AM. Always use your most recent med list.          amitriptyline 25 MG tablet Commonly known as:  ELAVIL TAKE 1 TABLET BY MOUTH IN THE MORNING  AND 3 TABS AT BEDTIME FOR RIGHT LEG PAIN.   amitriptyline 25 MG tablet Commonly known as:  ELAVIL TAKE 1 TABLET BY MOUTH IN THE MORNING AND 3 TABS AT BEDTIME FOR RIGHT LEG PAIN.   aspirin 81 MG tablet Take 1 tablet (81 mg total) by mouth daily.   atorvastatin 20 MG tablet Commonly known as:  LIPITOR Take 1 tablet (20 mg total) by mouth daily.   Azelastine HCl 0.15 % Soln 2 sprays each nostril bid   benzonatate 100 MG capsule Commonly known as:  TESSALON Take 1 capsule (100 mg total) by mouth 2 (two) times daily as needed for cough.   doxycycline 100 MG tablet Commonly known as:  VIBRA-TABS Take 1 tablet (100 mg total) by mouth 2 (two) times daily.   fluticasone 50 MCG/ACT nasal spray Commonly known as:  FLONASE Place 2  sprays into both nostrils daily.   FREESTYLE LIBRE SENSOR SYSTEM Misc by Does not apply route.   FREESTYLE LIBRE SENSOR SYSTEM Misc USE 1 EACH BY DOES NOT APPLY ROUTE EVERY 10 DAYS.   ibuprofen 200 MG tablet Commonly known as:  ADVIL,MOTRIN Take 200 mg by mouth every 6 (six) hours as needed.   insulin aspart 100 UNIT/ML FlexPen Commonly known as:  NOVOLOG FLEXPEN 5 U SQ q AC   Insulin Glargine 100 UNIT/ML Solostar Pen Commonly known as:  LANTUS SOLOSTAR 10 U SQ qhs   Insulin Pen Needle 32G X 4 MM Misc Commonly known as:  INSUPEN PEN NEEDLES (BD Ultra-fine needles) Use 4 times daily as needed.  Dx:E11.65   multivitamin tablet Take 1 tablet by mouth daily.   predniSONE 50 MG tablet Commonly known as:  DELTASONE Take 1 tablet (50 mg total) by mouth daily with breakfast.   SYNTHROID 150 MCG tablet Generic drug:  levothyroxine Take 1 tablet by mouth daily.   vitamin B-12 1000 MCG tablet Commonly known as:  CYANOCOBALAMIN Take 1 tablet (1,000 mcg total) by mouth daily.       All past medical history, surgical history, allergies, family history, immunizations andmedications were updated in the EMR today and reviewed under the history and medication portions of their EMR.     ROS: Negative, with the exception of above mentioned in HPI   Objective:  BP 137/90 (BP Location: Left Arm, Patient Position: Sitting, Cuff Size: Normal)   Pulse 87   Temp 98.6 F (37 C)   Wt 142 lb (64.4 kg)   SpO2 98%   BMI 23.81 kg/m  Body mass index is 23.81 kg/m. Gen: Afebrile. No acute distress. Nontoxic in appearance, well developed, well nourished.  HENT: AT. [place]. Bilateral TM unable to visualize d/t cerumen. Dry lips, tacky mucous membranes. Bilateral nares with drainage and mild erythema. Throat without erythema or exudates. PND, cough and frontal sinus TTP.  Eyes:Pupils Equal Round Reactive to light, Extraocular movements intact,  Conjunctiva without redness, discharge or  icterus. Neck/lymp/endocrine: Supple,no lymphadenopathy CV: RRR  Chest: CTAB, no wheeze or crackles. Good air movement, normal resp effort.  Skin: no rashes, purpura or petechiae.  Neuro: Normal gait. PERLA. EOMi. Alert. Oriented x3  No exam data present No results found. No results found for this or any previous visit (from the past 24 hour(s)).  Assessment/Plan: SHERREL SHAFER is a 51 y.o. male present for OV for  1. Acute frontal sinusitis, recurrence not specified Rest, hydrate.  +/- flonase, mucinex (DM if cough), nettie pot or nasal saline.  Doxy, prednisone and tessalon perles prescribed,  take abx and steroid until completed.  IM depo medrol provided, prednisone to start tomorrow. Monitor Sugars while on steroid.  If cough present it can last up to 6-8 weeks.  F/U 2 weeks of not improved.   - methylPREDNISolone acetate (DEPO-MEDROL) injection 80 mg   Reviewed expectations re: course of current medical issues.  Discussed self-management of symptoms.  Outlined signs and symptoms indicating need for more acute intervention.  Patient verbalized understanding and all questions were answered.  Patient received an After-Visit Summary.    No orders of the defined types were placed in this encounter.    Note is dictated utilizing voice recognition software. Although note has been proof read prior to signing, occasional typographical errors still can be missed. If any questions arise, please do not hesitate to call for verification.   electronically signed by:  Felix Pacinienee Reeanna Acri, DO  [facility] - [place]

## 2017-01-17 NOTE — Patient Instructions (Signed)
Rest, hydrate.  mucinex DM (helps with cough and decreasing mucous) Start today Doxycyline every 12 hours for 10 days prescribed, take until completed.  Start tomorrow prednisone for 4 days (shot was given for dose today). Tessalon perles also prescribed for cough.  You may see your sugar increase temporarily with prednisone use.  If cough present it can last up to 6-8 weeks.  F/U 2 weeks of not improved.     Sinusitis, Adult Sinusitis is soreness and inflammation of your sinuses. Sinuses are hollow spaces in the bones around your face. They are located:  Around your eyes.  In the middle of your forehead.  Behind your nose.  In your cheekbones.  Your sinuses and nasal passages are lined with a stringy fluid (mucus). Mucus normally drains out of your sinuses. When your nasal tissues get inflamed or swollen, the mucus can get trapped or blocked so air cannot flow through your sinuses. This lets bacteria, viruses, and funguses grow, and that leads to infection. Follow these instructions at home: Medicines  Take, use, or apply over-the-counter and prescription medicines only as told by your doctor. These may include nasal sprays.  If you were prescribed an antibiotic medicine, take it as told by your doctor. Do not stop taking the antibiotic even if you start to feel better. Hydrate and Humidify  Drink enough water to keep your pee (urine) clear or pale yellow.  Use a cool mist humidifier to keep the humidity level in your home above 50%.  Breathe in steam for 10-15 minutes, 3-4 times a day or as told by your doctor. You can do this in the bathroom while a hot shower is running.  Try not to spend time in cool or dry air. Rest  Rest as much as possible.  Sleep with your head raised (elevated).  Make sure to get enough sleep each night. General instructions  Put a warm, moist washcloth on your face 3-4 times a day or as told by your doctor. This will help with  discomfort.  Wash your hands often with soap and water. If there is no soap and water, use hand sanitizer.  Do not smoke. Avoid being around people who are smoking (secondhand smoke).  Keep all follow-up visits as told by your doctor. This is important. Contact a doctor if:  You have a fever.  Your symptoms get worse.  Your symptoms do not get better within 10 days. Get help right away if:  You have a very bad headache.  You cannot stop throwing up (vomiting).  You have pain or swelling around your face or eyes.  You have trouble seeing.  You feel confused.  Your neck is stiff.  You have trouble breathing. This information is not intended to replace advice given to you by your health care provider. Make sure you discuss any questions you have with your health care provider. Document Released: 07/17/2007 Document Revised: 09/24/2015 Document Reviewed: 11/23/2014 Elsevier Interactive Patient Education  [employer]2018 Elsevier Inc.

## 2017-01-21 ENCOUNTER — Other Ambulatory Visit: Payer: Self-pay | Admitting: Family Medicine

## 2017-03-17 ENCOUNTER — Ambulatory Visit: Payer: BLUE CROSS/BLUE SHIELD | Admitting: Family Medicine

## 2017-03-17 ENCOUNTER — Encounter: Payer: Self-pay | Admitting: Family Medicine

## 2017-03-17 VITALS — BP 106/73 | HR 113 | Temp 99.4°F | Ht 64.75 in | Wt 137.8 lb

## 2017-03-17 DIAGNOSIS — R6889 Other general symptoms and signs: Secondary | ICD-10-CM | POA: Diagnosis not present

## 2017-03-17 DIAGNOSIS — J101 Influenza due to other identified influenza virus with other respiratory manifestations: Secondary | ICD-10-CM

## 2017-03-17 LAB — POC INFLUENZA A&B (BINAX/QUICKVUE)
Influenza A, POC: POSITIVE — AB
Influenza B, POC: NEGATIVE

## 2017-03-17 MED ORDER — AZITHROMYCIN 250 MG PO TABS: ORAL_TABLET | ORAL | 0 refills | Status: DC

## 2017-03-17 MED ORDER — OSELTAMIVIR PHOSPHATE 75 MG PO CAPS: 75.0000 mg | ORAL_CAPSULE | Freq: Two times a day (BID) | ORAL | 0 refills | Status: DC

## 2017-03-17 NOTE — Progress Notes (Signed)
Daniel Stevenson , 08-16-1965, 52 y.o., male MRN: [id number] Patient Care Team    Relationship Specialty Notifications Start End  McGowen, Maryjean Morn, MD PCP - General Family Medicine  11/20/10   Warden Fillers, MD Consulting Physician Endocrinology  07/10/16     Chief Complaint  Patient presents with  . flu-like symptom    pt c/o of excessive sweating, headache, cough X 1 day     Subjective: Pt presents for an OV with complaints of cough of 1 day duration.  Associated symptoms include sweating, headache, chills, body aches, nausea and fatigue. His family member test positive for flu Friday. He is a type1 DM.  Flu shot UTD this season.  Pt has tried tylenol  to ease their symptoms.   Depression screen PHQ 2/9 11/14/2016  Decreased Interest 0  Down, Depressed, Hopeless 0  PHQ - 2 Score 0    Allergies  Allergen Reactions  . Augmentin [Amoxicillin-Pot Clavulanate] Nausea And Vomiting  . Oxycodone-Acetaminophen     REACTION: itching  . Levothyroxine Rash    Had rash associated with generic levothyroxine, but not brand-name Synthroid.  Likely allergic to a dye or filler in the generic preparation.   Social History   Tobacco Use  . Smoking status: Current Every Day Smoker    Packs/day: 1.00    Years: 35.00    Pack years: 35.00    Types: Cigarettes    Last attempt to quit: 12/19/2010    Years since quitting: 6.2  . Smokeless tobacco: Never Used  Substance Use Topics  . Alcohol use: Yes    Comment: rarely   Past Medical History:  Diagnosis Date  . Back pain    s/p back surgery  . Chronic hyponatremia 08/2012 onset   Onset was right after starting amitriptyline for his neuropathic leg pain--has been stable.  Likely ADH-like effect on kidney from amitriptyline.  Marland Kitchen History of low back pain 06/24/2011  . Hyperlipidemia   . Hypothyroidism   . LADA (latent autoimmune diabetes in adults), managed as type 1 (HCC) Dx'd 2014   Dr. Morrison Old.  . Migraines   . Neuropathic pain of  right lower extremity   . PLANTAR WART, RIGHT 10/09/2009  . Tobacco dependence    quit 2012; restarted not long after   Past Surgical History:  Procedure Laterality Date  . BACK SURGERY  2009   lower lumbar fusion- rubber disc in back  . CARDIOVASCULAR STRESS TEST  03/21/14   No ischemia/low risk study; however, LV function showed EF 49% and global LV hypokinesis--echo recommended and it showed normal LV fxn.  Marland Kitchen NASAL SINUS SURGERY  2007  . TRANSTHORACIC ECHOCARDIOGRAM  03/24/14   Normal LV fxn, EF 50-55%, no wall motion abnormalities: (grade 1 diast dysfxn)   Family History  Problem Relation Age of Onset  . Emphysema Mother        smoker  . Obesity Father    Allergies as of 03/17/2017      Reactions   Augmentin [amoxicillin-pot Clavulanate] Nausea And Vomiting   Oxycodone-acetaminophen    REACTION: itching   Levothyroxine Rash   Had rash associated with generic levothyroxine, but not brand-name Synthroid.  Likely allergic to a dye or filler in the generic preparation.      Medication List        Accurate as of 03/17/17  3:04 PM. Always use your most recent med list.          amitriptyline 25 MG tablet Commonly  known as:  ELAVIL TAKE 1 TABLET BY MOUTH IN THE MORNING AND 3 TABS AT BEDTIME FOR RIGHT LEG PAIN.   amitriptyline 25 MG tablet Commonly known as:  ELAVIL TAKE 1 TABLET BY MOUTH IN THE MORNING AND 3 TABS AT BEDTIME FOR RIGHT LEG PAIN.   aspirin 81 MG tablet Take 1 tablet (81 mg total) by mouth daily.   atorvastatin 20 MG tablet Commonly known as:  LIPITOR Take 1 tablet (20 mg total) by mouth daily.   Azelastine HCl 0.15 % Soln 2 sprays each nostril bid   azithromycin 250 MG tablet Commonly known as:  ZITHROMAX 500 mg day 1, then 250 mg QD   benzonatate 100 MG capsule Commonly known as:  TESSALON Take 1 capsule (100 mg total) by mouth 2 (two) times daily as needed for cough.   fluticasone 50 MCG/ACT nasal spray Commonly known as:  FLONASE Place 2 sprays  into both nostrils daily.   FREESTYLE LIBRE SENSOR SYSTEM Misc by Does not apply route.   FREESTYLE LIBRE SENSOR SYSTEM Misc USE 1 EACH BY DOES NOT APPLY ROUTE EVERY 10 DAYS.   Insulin Pen Needle 32G X 4 MM Misc Commonly known as:  INSUPEN PEN NEEDLES (BD Ultra-fine needles) Use 4 times daily as needed.  Dx:E11.65   NOVOLOG FLEXPEN 100 UNIT/ML FlexPen Generic drug:  insulin aspart INJECT 5 UNITS SUBCUTANEOUS BEFORE MEALS   oseltamivir 75 MG capsule Commonly known as:  TAMIFLU Take 1 capsule (75 mg total) by mouth 2 (two) times daily.   SYNTHROID 150 MCG tablet Generic drug:  levothyroxine Take 1 tablet by mouth daily.       All past medical history, surgical history, allergies, family history, immunizations andmedications were updated in the EMR today and reviewed under the history and medication portions of their EMR.     ROS: Negative, with the exception of above mentioned in HPI   Objective:  BP 106/73 (BP Location: Right Arm, Patient Position: Sitting, Cuff Size: Normal)   Pulse (!) 113   Temp 99.4 F (37.4 C) (Oral)   Ht 5' 4.75" (1.645 m)   Wt 137 lb 12.8 oz (62.5 kg)   SpO2 98%   BMI 23.11 kg/m  Body mass index is 23.11 kg/m. Gen: febrile. No acute distress. Nontoxic in appearance, well developed, well nourished.  HENT: AT. [place]. Bilateral TM visualized unable to be visualized cerumen. MMM, no oral lesions. Bilateral nares mild erythema and drainage. Throat without erythema or exudates. Mild cough. No hoarseness present. Eyes:Pupils Equal Round Reactive to light, Extraocular movements intact,  Conjunctiva without redness, discharge or icterus. Neck/lymp/endocrine: Supple, No  lymphadenopathy CV: RRR (tachy) Chest: CTAB, no wheeze or crackles. Good air movement, normal resp effort.  Abd: Soft. NTND. BS present Skin: no rashes, purpura or petechiae.  Neuro:  Normal gait. PERLA. EOMi. Alert. Oriented x3   No exam data present No results found. Results for  orders placed or performed in visit on 03/17/17 (from the past 24 hour(s))  POC Influenza A&B (Binax test)     Status: Abnormal   Collection Time: 03/17/17  2:40 PM  Result Value Ref Range   Influenza A, POC Positive (A) Negative   Influenza B, POC Negative Negative    Assessment/Plan: Daniel Stevenson is a 52 y.o. male present for OV for  Flu-like symptoms/Influenza A - positive influenza A today. IDDM.  - Rest, hydrate. Monitor glucose with illness.  + flonase, mucinex (DM if cough), nettie pot or nasal saline.  Tamiflu and Z-pack (IDDM and smoker--> high risk for complications) prescribed, take until completed.  - work excuse provided for 3 days.  If cough present it can last up to 6-8 weeks.  F/U 1 week if not improving sooner if worsening.  Reviewed expectations re: course of current medical issues.  Discussed self-management of symptoms.  Outlined signs and symptoms indicating need for more acute intervention.  Patient verbalized understanding and all questions were answered.  Patient received an After-Visit Summary.    Orders Placed This Encounter  Procedures  . POC Influenza A&B (Binax test)     Note is dictated utilizing voice recognition software. Although note has been proof read prior to signing, occasional typographical errors still can be missed. If any questions arise, please do not hesitate to call for verification.   electronically signed by:  Felix Pacinienee Keyler Hoge, DO  [facility] - [place]

## 2017-03-17 NOTE — Patient Instructions (Addendum)
Your influenza A is positive.  Rest, hydrate.  + flonase, mucinex (DM if cough) tamiflu and azithromycin  prescribed, take until completed.  If cough present it can last up to 6-8 weeks.  F/U 2 weeks of not improved.   Watch your glucose closely with illness.    Influenza, Adult Influenza ("the flu") is an infection in the lungs, nose, and throat (respiratory tract). It is caused by a virus. The flu causes many common cold symptoms, as well as a high fever and body aches. It can make you feel very sick. The flu spreads easily from person to person (is contagious). Getting a flu shot (influenza vaccination) every year is the best way to prevent the flu. Follow these instructions at home:  Take over-the-counter and prescription medicines only as told by your doctor.  Use a cool mist humidifier to add moisture (humidity) to the air in your home. This can make it easier to breathe.  Rest as needed.  Drink enough fluid to keep your pee (urine) clear or pale yellow.  Cover your mouth and nose when you cough or sneeze.  Wash your hands with soap and water often, especially after you cough or sneeze. If you cannot use soap and water, use hand sanitizer.  Stay home from work or school as told by your doctor. Unless you are visiting your doctor, try to avoid leaving home until your fever has been gone for 24 hours without the use of medicine.  Keep all follow-up visits as told by your doctor. This is important. How is this prevented?  Getting a yearly (annual) flu shot is the best way to avoid getting the flu. You may get the flu shot in late summer, fall, or winter. Ask your doctor when you should get your flu shot.  Wash your hands often or use hand sanitizer often.  Avoid contact with people who are sick during cold and flu season.  Eat healthy foods.  Drink plenty of fluids.  Get enough sleep.  Exercise regularly. Contact a doctor if:  You get new symptoms.  You  have: ? Chest pain. ? Watery poop (diarrhea). ? A fever.  Your cough gets worse.  You start to have more mucus.  You feel sick to your stomach (nauseous).  You throw up (vomit). Get help right away if:  You start to be short of breath or have trouble breathing.  Your skin or nails turn a bluish color.  You have very bad pain or stiffness in your neck.  You get a sudden headache.  You get sudden pain in your face or ear.  You cannot stop throwing up. This information is not intended to replace advice given to you by your health care provider. Make sure you discuss any questions you have with your health care provider. Document Released: 11/07/2007 Document Revised: 07/06/2015 Document Reviewed: 11/22/2014 Elsevier Interactive Patient Education  2017 [employer]

## 2017-03-19 ENCOUNTER — Other Ambulatory Visit: Payer: Self-pay

## 2017-03-19 NOTE — Telephone Encounter (Signed)
Opened in error

## 2017-03-20 ENCOUNTER — Ambulatory Visit: Payer: BLUE CROSS/BLUE SHIELD | Admitting: Family Medicine

## 2017-03-20 ENCOUNTER — Ambulatory Visit (HOSPITAL_BASED_OUTPATIENT_CLINIC_OR_DEPARTMENT_OTHER)
Admission: RE | Admit: 2017-03-20 | Discharge: 2017-03-20 | Disposition: A | Discharge status: Home / Self Care | Payer: BLUE CROSS/BLUE SHIELD | Source: Ambulatory Visit | Attending: Family Medicine | Admitting: Family Medicine

## 2017-03-20 ENCOUNTER — Encounter: Payer: Self-pay | Admitting: Family Medicine

## 2017-03-20 VITALS — BP 99/66 | HR 114 | Temp 98.1°F | Ht 64.75 in | Wt 135.8 lb

## 2017-03-20 DIAGNOSIS — R059 Cough, unspecified: Secondary | ICD-10-CM

## 2017-03-20 DIAGNOSIS — R509 Fever, unspecified: Secondary | ICD-10-CM | POA: Diagnosis not present

## 2017-03-20 DIAGNOSIS — J101 Influenza due to other identified influenza virus with other respiratory manifestations: Secondary | ICD-10-CM | POA: Diagnosis not present

## 2017-03-20 DIAGNOSIS — R05 Cough: Secondary | ICD-10-CM | POA: Diagnosis not present

## 2017-03-20 LAB — BASIC METABOLIC PANEL
BUN: 11 mg/dL (ref 6–23)
CALCIUM: 8.7 mg/dL (ref 8.4–10.5)
CHLORIDE: 88 meq/L — AB (ref 96–112)
CO2: 29 meq/L (ref 19–32)
Creatinine, Ser: 0.82 mg/dL (ref 0.40–1.50)
GFR: 104.79 mL/min (ref >60.00)
Glucose, Bld: 265 mg/dL — ABNORMAL HIGH (ref 70–99)
Potassium: 4.3 mEq/L (ref 3.5–5.1)
Sodium: 125 mEq/L — ABNORMAL LOW (ref 135–145)

## 2017-03-20 LAB — CBC WITH DIFFERENTIAL/PLATELET
BASOS PCT: 1 % (ref 0.0–3.0)
Basophils Absolute: 0.1 10*3/uL (ref 0.0–0.1)
EOS ABS: 0.1 10*3/uL (ref 0.0–0.7)
EOS PCT: 1.2 % (ref 0.0–5.0)
HCT: 46.1 % (ref 39.0–52.0)
Hemoglobin: 16 g/dL (ref 13.0–17.0)
LYMPHS ABS: 1.4 10*3/uL (ref 0.7–4.0)
Lymphocytes Relative: 21.4 % (ref 12.0–46.0)
MCHC: 34.7 g/dL (ref 30.0–36.0)
MCV: 89.9 fl (ref 78.0–100.0)
MONO ABS: 0.8 10*3/uL (ref 0.1–1.0)
Monocytes Relative: 12.5 % — ABNORMAL HIGH (ref 3.0–12.0)
NEUTROS PCT: 63.9 % (ref 43.0–77.0)
Neutro Abs: 4.1 10*3/uL (ref 1.4–7.7)
Platelets: 235 10*3/uL (ref 150.0–400.0)
RBC: 5.13 Mil/uL (ref 4.22–5.81)
RDW: 14 % (ref 11.5–15.5)
WBC: 6.4 10*3/uL (ref 4.0–10.5)

## 2017-03-20 MED ORDER — HYDROCODONE-HOMATROPINE 5-1.5 MG/5ML PO SYRP: 5.0000 mL | ORAL_SOLUTION | Freq: Two times a day (BID) | ORAL | 0 refills | Status: DC | PRN

## 2017-03-20 NOTE — Progress Notes (Signed)
Daniel Stevenson , [birth year], 52 y.o., male MRN: [id number] Patient Care Team    Relationship Specialty Notifications Start End  McGowen, Maryjean Morn, MD PCP - General Family Medicine  11/20/10   Warden Fillers, MD Consulting Physician Endocrinology  07/10/16     Chief Complaint  Patient presents with  . Follow-up    Pt is here for f/u for FLU. pt says he is not feeling better, still complaining of night sweats, chills and body ache and fatigue.     Subjective: Pt presents for an OV was seen for recent influenza A+ illness 3 days ago. Patient was started on Tamiflu. Z-Pak was also started secondary to patient's history, he is at high risk of complications secondary to influenza. Patient reports he is not feeling much better. He is still complaining of night sweats, cough and body aches. He is still fatigued. He has a decreased appetite and not eating well, but endorses drinking plenty of water to remain hydrated. He has a type I diabetic, with reports of increased blood sugars around 200 with illness. Patient has continued taking Mucinex DM and Flonase.   Depression screen PHQ 2/9 11/14/2016  Decreased Interest 0  Down, Depressed, Hopeless 0  PHQ - 2 Score 0    Allergies  Allergen Reactions  . Augmentin [Amoxicillin-Pot Clavulanate] Nausea And Vomiting  . Oxycodone-Acetaminophen     REACTION: itching  . Levothyroxine Rash    Had rash associated with generic levothyroxine, but not brand-name Synthroid.  Likely allergic to a dye or filler in the generic preparation.   Social History   Tobacco Use  . Smoking status: Current Every Day Smoker    Packs/day: 1.00    Years: 35.00    Pack years: 35.00    Types: Cigarettes    Last attempt to quit: 12/19/2010    Years since quitting: 6.2  . Smokeless tobacco: Never Used  Substance Use Topics  . Alcohol use: Yes    Comment: rarely   Past Medical History:  Diagnosis Date  . Back pain    s/p back surgery  . Chronic hyponatremia  08/2012 onset   Onset was right after starting amitriptyline for his neuropathic leg pain--has been stable.  Likely ADH-like effect on kidney from amitriptyline.  Marland Kitchen History of low back pain 06/24/2011  . Hyperlipidemia   . Hypothyroidism   . LADA (latent autoimmune diabetes in adults), managed as type 1 (HCC) Dx'd 2014   Dr. Morrison Old.  . Migraines   . Neuropathic pain of right lower extremity   . PLANTAR WART, RIGHT 10/09/2009  . Tobacco dependence    quit 2012; restarted not long after   Past Surgical History:  Procedure Laterality Date  . BACK SURGERY  2009   lower lumbar fusion- rubber disc in back  . CARDIOVASCULAR STRESS TEST  03/21/14   No ischemia/low risk study; however, LV function showed EF 49% and global LV hypokinesis--echo recommended and it showed normal LV fxn.  Marland Kitchen NASAL SINUS SURGERY  2007  . TRANSTHORACIC ECHOCARDIOGRAM  03/24/14   Normal LV fxn, EF 50-55%, no wall motion abnormalities: (grade 1 diast dysfxn)   Family History  Problem Relation Age of Onset  . Emphysema Mother        smoker  . Obesity Father    Allergies as of 03/20/2017      Reactions   Augmentin [amoxicillin-pot Clavulanate] Nausea And Vomiting   Oxycodone-acetaminophen    REACTION: itching   Levothyroxine Rash   Had  rash associated with generic levothyroxine, but not brand-name Synthroid.  Likely allergic to a dye or filler in the generic preparation.      Medication List        Accurate as of 03/20/17 10:41 AM. Always use your most recent med list.          amitriptyline 25 MG tablet Commonly known as:  ELAVIL TAKE 1 TABLET BY MOUTH IN THE MORNING AND 3 TABS AT BEDTIME FOR RIGHT LEG PAIN.   amitriptyline 25 MG tablet Commonly known as:  ELAVIL TAKE 1 TABLET BY MOUTH IN THE MORNING AND 3 TABS AT BEDTIME FOR RIGHT LEG PAIN.   aspirin 81 MG tablet Take 1 tablet (81 mg total) by mouth daily.   Azelastine HCl 0.15 % Soln 2 sprays each nostril bid   azithromycin 250 MG tablet Commonly  known as:  ZITHROMAX 500 mg day 1, then 250 mg QD   benzonatate 100 MG capsule Commonly known as:  TESSALON Take 1 capsule (100 mg total) by mouth 2 (two) times daily as needed for cough.   fluticasone 50 MCG/ACT nasal spray Commonly known as:  FLONASE Place 2 sprays into both nostrils daily.   FREESTYLE LIBRE SENSOR SYSTEM Misc by Does not apply route.   FREESTYLE LIBRE SENSOR SYSTEM Misc USE 1 EACH BY DOES NOT APPLY ROUTE EVERY 10 DAYS.   Insulin Pen Needle 32G X 4 MM Misc Commonly known as:  INSUPEN PEN NEEDLES (BD Ultra-fine needles) Use 4 times daily as needed.  Dx:E11.65   NOVOLOG FLEXPEN 100 UNIT/ML FlexPen Generic drug:  insulin aspart INJECT 5 UNITS SUBCUTANEOUS BEFORE MEALS   oseltamivir 75 MG capsule Commonly known as:  TAMIFLU Take 1 capsule (75 mg total) by mouth 2 (two) times daily.   SYNTHROID 150 MCG tablet Generic drug:  levothyroxine Take 1 tablet by mouth daily.       All past medical history, surgical history, allergies, family history, immunizations andmedications were updated in the EMR today and reviewed under the history and medication portions of their EMR.     ROS: Negative, with the exception of above mentioned in HPI   Objective:  BP 99/66 (BP Location: Left Arm, Patient Position: Sitting, Cuff Size: Normal)   Pulse (!) 114   Temp 98.1 F (36.7 C) (Oral)   Ht 5' 4.75" (1.645 m)   Wt 135 lb 12.8 oz (61.6 kg)   SpO2 99%   BMI 22.77 kg/m  Body mass index is 22.77 kg/m. Gen: Afebrile. No acute distress. Nontoxic in appearance, well developed, well nourished.  HENT: AT. [place]. Bilateral TM unable to be visualized secondary to cerumen.. MMM, no oral lesions. Bilateral nares mild erythema and drainage. Throat without erythema or exudates. Moderate cough. No hoarseness. Eyes:Pupils Equal Round Reactive to light, Extraocular movements intact,  Conjunctiva without redness, discharge or icterus. Neck/lymp/endocrine: Supple, no lymphadenopathy CV:  RRR  Chest: CTAB, no wheeze or crackles. Rhonchi present. Cough with deep inspiration. Good air movement, normal resp effort.  Abd: Soft. NTND. BS present.  Neuro:  Normal gait. PERLA. EOMi. Alert. Oriented x3   No exam data present No results found. No results found for this or any previous visit (from the past 24 hour(s)).  Assessment/Plan: Mayer MaskerRobert M Ricotta is a 52 y.o. male present for OV for  Influenza A/cough/fever: - Discussed symptoms with patient may be evidence of progression to pneumonia or natural course of influenza. Will check lab work today and chest x-ray.  - If chest x-ray  positive, will provide broader coverage antibiotic. If negative and lab work does not indicate secondary infection, patient advised to keep rested, hydrate and finished course of Tamiflu and Z-Pak. - Basic Metabolic Panel (BMET) - DG Chest 2 View; Future - CBC w/Diff - Follow-up dependent upon chest x-ray and laboratory results. Patient will be contacted.   Reviewed expectations re: course of current medical issues.  Discussed self-management of symptoms.  Outlined signs and symptoms indicating need for more acute intervention.  Patient verbalized understanding and all questions were answered.  Patient received an After-Visit Summary.    No orders of the defined types were placed in this encounter.    Note is dictated utilizing voice recognition software. Although note has been proof read prior to signing, occasional typographical errors still can be missed. If any questions arise, please do not hesitate to call for verification.   electronically signed by:  Felix Pacini, DO  [facility] - [place]

## 2017-03-20 NOTE — Patient Instructions (Signed)
We will get your blood work and chest xray today. I will call you with results.  If chest xray positive we will need to switch antibiotics.   Rest. Hydrate.  Cough syrup prescribed. This is a controlled substance.

## 2017-04-21 ENCOUNTER — Telehealth: Payer: Self-pay | Admitting: Family Medicine

## 2017-04-21 MED ORDER — LEVOTHYROXINE SODIUM 137 MCG PO TABS: ORAL_TABLET | ORAL | 0 refills | Status: DC

## 2017-04-21 NOTE — Telephone Encounter (Signed)
Copied from CRM [id number]. Topic: Quick Communication - Rx Refill/Question >> Apr 21, 2017 12:32 PM Eston Mouldavis, Lyndsay Talamante B wrote: Medication: levothyroxine (SYNTHROID) 150 MCG tablet   Has the patient contacted their pharmacy?yes   (Agent: If no, request that the patient contact the pharmacy for the refill.)   Preferred Pharmacy (with phone number or street name): [id number] - [place], [place] - [address] [phone] (Phone) [phone] (Fax)    Agent: Please be advised that RX refills may take up to 3 business days. We ask that you follow-up with your pharmacy.

## 2017-04-21 NOTE — Telephone Encounter (Signed)
Pt due for TSH recheck, Pt advised and voiced understanding.  Lab apt made for 04/22/17 at 4:00pm. Future lab ordered.   Medication dose and sig updated to reflect most recent change done 05/15/16.

## 2017-04-21 NOTE — Telephone Encounter (Signed)
Refill for Synthroid  Historical Med  LOV 03/30/17 with Dr. Claiborne BillingsKuneff  TSH 05/17/16

## 2017-04-22 ENCOUNTER — Telehealth: Payer: Self-pay | Admitting: Family Medicine

## 2017-04-22 ENCOUNTER — Other Ambulatory Visit (INDEPENDENT_AMBULATORY_CARE_PROVIDER_SITE_OTHER): Payer: BLUE CROSS/BLUE SHIELD

## 2017-04-22 ENCOUNTER — Encounter: Payer: Self-pay | Admitting: Family Medicine

## 2017-04-22 DIAGNOSIS — E039 Hypothyroidism, unspecified: Secondary | ICD-10-CM

## 2017-04-22 DIAGNOSIS — E871 Hypo-osmolality and hyponatremia: Secondary | ICD-10-CM

## 2017-04-22 NOTE — Telephone Encounter (Signed)
Patient came into lab today for repeat TSH.  He states that when he was sick and had labs drawn his sodium was low.  Patient requesting repeat if possible.  Okay to add on test?

## 2017-04-23 ENCOUNTER — Encounter: Payer: Self-pay | Admitting: Family Medicine

## 2017-04-23 LAB — BASIC METABOLIC PANEL
BUN: 19 mg/dL (ref 6–23)
CHLORIDE: 91 meq/L — AB (ref 96–112)
CO2: 30 mEq/L (ref 19–32)
Calcium: 9.9 mg/dL (ref 8.4–10.5)
Creatinine, Ser: 0.86 mg/dL (ref 0.40–1.50)
GFR: 99.15 mL/min (ref >60.00)
GLUCOSE: 36 mg/dL — AB (ref 70–99)
Potassium: 4.7 mEq/L (ref 3.5–5.1)
SODIUM: 129 meq/L — AB (ref 135–145)

## 2017-04-23 LAB — TSH: TSH: 5.42 u[IU]/mL — AB (ref 0.35–4.50)

## 2017-04-23 MED ORDER — SYNTHROID 137 MCG PO TABS: ORAL_TABLET | ORAL | 0 refills | Status: DC

## 2017-04-23 NOTE — Telephone Encounter (Signed)
Yes, ok to add BMET, dx hyponatremia.-thx

## 2017-04-23 NOTE — Telephone Encounter (Signed)
[facility] at [facility] and he stated that the smallest amount that can give pt is 15ml, they are unable to break a box. Pt will either continue to pay the $135 or see if he can find another pharmacy that will break a box.   SW pt and advised him, he voiced understanding.   Pt also asked in his Rx for thyroid was sent in as brand Synthroid, he is allergic to the generic. Advised pt that I will resend Rx for Brand.

## 2017-04-23 NOTE — Addendum Note (Signed)
Addended by: Eulah PontALBRIGHT, Yanel Dombrosky M on: 04/23/2017 10:49 AM   Modules accepted: Orders

## 2017-04-23 NOTE — Telephone Encounter (Signed)
Order added to labs

## 2017-04-25 NOTE — Telephone Encounter (Signed)
Patient notified to contact Dr. Morrison OldLambeth, endocrinologist for his diabetic medication changes and refills. Patient verbalized understanding.

## 2017-04-28 ENCOUNTER — Other Ambulatory Visit: Payer: Self-pay | Admitting: *Deleted

## 2017-04-28 ENCOUNTER — Ambulatory Visit: Payer: BLUE CROSS/BLUE SHIELD | Admitting: Family Medicine

## 2017-04-28 DIAGNOSIS — E039 Hypothyroidism, unspecified: Secondary | ICD-10-CM

## 2017-04-28 MED ORDER — SYNTHROID 137 MCG PO TABS: ORAL_TABLET | ORAL | 2 refills | Status: DC

## 2017-05-02 DIAGNOSIS — R5382 Chronic fatigue, unspecified: Secondary | ICD-10-CM | POA: Diagnosis not present

## 2017-05-02 DIAGNOSIS — E039 Hypothyroidism, unspecified: Secondary | ICD-10-CM | POA: Diagnosis not present

## 2017-05-02 DIAGNOSIS — E109 Type 1 diabetes mellitus without complications: Secondary | ICD-10-CM | POA: Diagnosis not present

## 2017-05-02 LAB — MICROALBUMIN, URINE: MICROALB UR: 159.4

## 2017-05-02 LAB — HM DIABETES FOOT EXAM

## 2017-05-17 ENCOUNTER — Other Ambulatory Visit: Payer: Self-pay | Admitting: Family Medicine

## 2017-06-25 ENCOUNTER — Encounter: Payer: Self-pay | Admitting: Family Medicine

## 2017-06-25 ENCOUNTER — Ambulatory Visit: Payer: BLUE CROSS/BLUE SHIELD | Admitting: Family Medicine

## 2017-06-25 VITALS — BP 142/71 | HR 92 | Temp 98.2°F | Resp 16 | Ht 64.0 in | Wt 147.1 lb

## 2017-06-25 DIAGNOSIS — M545 Low back pain, unspecified: Secondary | ICD-10-CM

## 2017-06-25 MED ORDER — CYCLOBENZAPRINE HCL 10 MG PO TABS: 10.0000 mg | ORAL_TABLET | Freq: Three times a day (TID) | ORAL | 0 refills | Status: DC | PRN

## 2017-06-25 MED ORDER — PREDNISONE 20 MG PO TABS: ORAL_TABLET | ORAL | 0 refills | Status: DC

## 2017-06-25 NOTE — Patient Instructions (Signed)
While you are on prednisone, do not take any over the counter medication for pain except tylenol.

## 2017-06-25 NOTE — Progress Notes (Signed)
OFFICE VISIT  06/25/2017   CC:  Chief Complaint  Patient presents with  . Back Pain    low back   HPI:    Patient is a 52 y.o. Caucasian male with a long history of recurrent low back pain (with chronic neuropathic pain of R leg) who presents for low back pain. Back very sore lately --about 1 month of constant throb, with extension of the pain into left buttocks region. Turning L spine causes a worsening of the pain.  Wearing back brace at work, applying salon pas and heating pad/ice.  Taking aleve, BC, ibup, tylenol (one a time--not together) multiple times per day. No recent trauma. Started up after breaking some lugnuts on a car. No paresthesias.   He stretches every morning.  Has been doing relative rest x 1 mo.  Still working.  ROS: no saddle anesthesia, no leg weakness, no loss of b/b control.  Past Medical History:  Diagnosis Date  . Back pain    s/p back surgery  . Chronic hyponatremia 08/2012 onset   Onset was right after starting amitriptyline for his neuropathic leg pain--has been stable.  Likely ADH-like effect on kidney from amitriptyline.  Marland Kitchen History of low back pain 06/24/2011  . Hyperlipidemia   . Hypothyroidism   . LADA (latent autoimmune diabetes in adults), managed as type 1 (HCC) Dx'd 2014   Dr. Morrison Old.  . Migraines   . Neuropathic pain of right lower extremity   . PLANTAR WART, RIGHT 10/09/2009  . Tobacco dependence    quit 2012; restarted not long after    Past Surgical History:  Procedure Laterality Date  . BACK SURGERY  2009   lower lumbar fusion- rubber disc in back  . CARDIOVASCULAR STRESS TEST  03/21/14   No ischemia/low risk study; however, LV function showed EF 49% and global LV hypokinesis--echo recommended and it showed normal LV fxn.  Marland Kitchen NASAL SINUS SURGERY  2007  . TRANSTHORACIC ECHOCARDIOGRAM  03/24/14   Normal LV fxn, EF 50-55%, no wall motion abnormalities: (grade 1 diast dysfxn)    Outpatient Medications Prior to Visit  Medication Sig  Dispense Refill  . amitriptyline (ELAVIL) 25 MG tablet TAKE 1 TABLET BY MOUTH IN THE MORNING AND 3 TABS AT BEDTIME FOR RIGHT LEG PAIN. 120 tablet 11  . Continuous Blood Gluc Sensor (FREESTYLE LIBRE SENSOR SYSTEM) MISC by Does not apply route.    . Continuous Blood Gluc Sensor (FREESTYLE LIBRE SENSOR SYSTEM) MISC USE 1 EACH BY DOES NOT APPLY ROUTE EVERY 10 DAYS.  99  . fluticasone (FLONASE) 50 MCG/ACT nasal spray PLACE 2 SPRAYS INTO BOTH NOSTRILS DAILY. 16 g 5  . Insulin Glargine (LANTUS SOLOSTAR) 100 UNIT/ML Solostar Pen Inject 12 Units into the skin at bedtime.    . Insulin Pen Needle (INSUPEN PEN NEEDLES) 32G X 4 MM MISC (BD Ultra-fine needles) Use 4 times daily as needed.  Dx:E11.65 100 each 11  . NOVOLOG FLEXPEN 100 UNIT/ML FlexPen INJECT 5 UNITS SUBCUTANEOUS BEFORE MEALS 15 mL 1  . SYNTHROID 137 MCG tablet Take 1 tablet every day expect for Mondays, Wednesdays and Fridays take 1.5 tablets (Patient taking differently: Take 175 mcg by mouth daily before breakfast. ) 35 tablet 2  . Vitamin D, Ergocalciferol, (DRISDOL) 50000 units CAPS capsule Take 1 capsule by mouth once a week.    Marland Kitchen amitriptyline (ELAVIL) 25 MG tablet TAKE 1 TABLET BY MOUTH IN THE MORNING AND 3 TABS AT BEDTIME FOR RIGHT LEG PAIN. (Patient not taking: Reported  on 06/25/2017) 120 tablet 6  . aspirin 81 MG tablet Take 1 tablet (81 mg total) by mouth daily. (Patient not taking: Reported on 06/25/2017) 30 tablet 0  . Azelastine HCl 0.15 % SOLN 2 sprays each nostril bid (Patient not taking: Reported on 01/17/2017) 30 mL 11  . azithromycin (ZITHROMAX) 250 MG tablet 500 mg day 1, then 250 mg QD (Patient not taking: Reported on 06/25/2017) 6 tablet 0  . benzonatate (TESSALON) 100 MG capsule Take 1 capsule (100 mg total) by mouth 2 (two) times daily as needed for cough. (Patient not taking: Reported on 03/17/2017) 20 capsule 0  . HYDROcodone-homatropine (HYCODAN) 5-1.5 MG/5ML syrup Take 5 mLs by mouth every 12 (twelve) hours as needed for cough.  (Patient not taking: Reported on 06/25/2017) 120 mL 0  . oseltamivir (TAMIFLU) 75 MG capsule Take 1 capsule (75 mg total) by mouth 2 (two) times daily. (Patient not taking: Reported on 06/25/2017) 10 capsule 0   No facility-administered medications prior to visit.     Allergies  Allergen Reactions  . Augmentin [Amoxicillin-Pot Clavulanate] Nausea And Vomiting  . Oxycodone-Acetaminophen     REACTION: itching  . Levothyroxine Rash    Had rash associated with generic levothyroxine, but not brand-name Synthroid.  Likely allergic to a dye or filler in the generic preparation.    ROS As per HPI  PE: Blood pressure (!) 142/71, pulse 92, temperature 98.2 F (36.8 C), temperature source Oral, resp. rate 16, height  (1.626 m), weight 147 lb 2 oz (66.7 kg), SpO2 99 %. Gen: Alert, well appearing.  Patient is oriented to person, place, time, and situation. AFFECT: pleasant, lucid thought and speech. BACK: lumbar ROM limited to 80 deg flexion.  Otherwise full ROM of L spine but all motions worsen the LB pain, esp bending lat to left and rotation.  He has Diffuse TTP over spinous processes of L spine as well as over facet joint regions of L spine L>>R.  Soft tissue TTP in lower lumbar regions bilat, L>>R.  No ischial tuberosity tenderness. Sitting SLR neg bilat. LE strength 5/5 prox/dist bilat. Patellar and achilles DTRs 1+ bilat.  LABS:  None today  IMPRESSION AND PLAN:  Acute episode of musculoskeletal low back pain. I suspect his left glut region pain is musculoskeletal vs SI in etiology more than any sign of spinal nerve impingement/radiculopathy.   No imaging indicated at this time. Patient definitely wants to avoid specialist or advanced imaging at this time. Decided to stop all NSAIDs, do a 10d course of prednisone (40 qd x 5d, then 20 qd x 5d). Flexeril  tid prn, #30, no RF. Continue stretches, heat, relative rest, wear lumbar back support at work. I reminded patient that the  prednisone can increase glucoses, monitor these closely and he may need to adjust insulin dosing accordingly (encouraged pt to discuss with his endocrinologist if needed).  An After Visit Summary was printed and given to the patient.  FOLLOW UP: Return if symptoms worsen or fail to improve in 10-14 days.  Signed:  Santiago Bumpers, MD           06/25/2017

## 2017-08-04 DIAGNOSIS — E559 Vitamin D deficiency, unspecified: Secondary | ICD-10-CM | POA: Diagnosis not present

## 2017-08-04 DIAGNOSIS — E871 Hypo-osmolality and hyponatremia: Secondary | ICD-10-CM | POA: Diagnosis not present

## 2017-08-04 DIAGNOSIS — E039 Hypothyroidism, unspecified: Secondary | ICD-10-CM | POA: Diagnosis not present

## 2017-08-04 DIAGNOSIS — E139 Other specified diabetes mellitus without complications: Secondary | ICD-10-CM | POA: Diagnosis not present

## 2017-11-04 DIAGNOSIS — E039 Hypothyroidism, unspecified: Secondary | ICD-10-CM | POA: Diagnosis not present

## 2017-11-04 DIAGNOSIS — E139 Other specified diabetes mellitus without complications: Secondary | ICD-10-CM | POA: Diagnosis not present

## 2017-11-04 LAB — HEMOGLOBIN A1C: Hgb A1c MFr Bld: 7.4 — AB (ref 4.0–6.0)

## 2017-11-18 ENCOUNTER — Encounter: Payer: Self-pay | Admitting: Family Medicine

## 2017-11-18 ENCOUNTER — Ambulatory Visit (INDEPENDENT_AMBULATORY_CARE_PROVIDER_SITE_OTHER): Payer: BLUE CROSS/BLUE SHIELD | Admitting: Family Medicine

## 2017-11-18 VITALS — BP 125/85 | HR 81 | Temp 97.9°F | Resp 16 | Ht 64.75 in | Wt 147.0 lb

## 2017-11-18 DIAGNOSIS — E039 Hypothyroidism, unspecified: Secondary | ICD-10-CM | POA: Diagnosis not present

## 2017-11-18 DIAGNOSIS — Z23 Encounter for immunization: Secondary | ICD-10-CM

## 2017-11-18 DIAGNOSIS — Z1211 Encounter for screening for malignant neoplasm of colon: Secondary | ICD-10-CM

## 2017-11-18 DIAGNOSIS — E871 Hypo-osmolality and hyponatremia: Secondary | ICD-10-CM | POA: Diagnosis not present

## 2017-11-18 DIAGNOSIS — Z0000 Encounter for general adult medical examination without abnormal findings: Secondary | ICD-10-CM | POA: Diagnosis not present

## 2017-11-18 DIAGNOSIS — Z125 Encounter for screening for malignant neoplasm of prostate: Secondary | ICD-10-CM | POA: Diagnosis not present

## 2017-11-18 DIAGNOSIS — E139 Other specified diabetes mellitus without complications: Secondary | ICD-10-CM | POA: Diagnosis not present

## 2017-11-18 LAB — PSA: PSA: 0.23 ng/mL (ref 0.10–4.00)

## 2017-11-18 LAB — LIPID PANEL
CHOL/HDL RATIO: 2
CHOLESTEROL: 142 mg/dL (ref 0–200)
HDL: 61.9 mg/dL (ref >39.00)
LDL CALC: 65 mg/dL (ref 0–99)
NonHDL: 79.91
TRIGLYCERIDES: 75 mg/dL (ref 0.0–149.0)
VLDL: 15 mg/dL (ref 0.0–40.0)

## 2017-11-18 LAB — CBC WITH DIFFERENTIAL/PLATELET
BASOS ABS: 0.1 10*3/uL (ref 0.0–0.1)
BASOS PCT: 1.1 % (ref 0.0–3.0)
Eosinophils Absolute: 0.4 10*3/uL (ref 0.0–0.7)
Eosinophils Relative: 3.9 % (ref 0.0–5.0)
HEMATOCRIT: 45.5 % (ref 39.0–52.0)
HEMOGLOBIN: 15.5 g/dL (ref 13.0–17.0)
LYMPHS PCT: 13.6 % (ref 12.0–46.0)
Lymphs Abs: 1.4 10*3/uL (ref 0.7–4.0)
MCHC: 34.1 g/dL (ref 30.0–36.0)
MCV: 91.6 fl (ref 78.0–100.0)
MONOS PCT: 7.5 % (ref 3.0–12.0)
Monocytes Absolute: 0.8 10*3/uL (ref 0.1–1.0)
NEUTROS ABS: 7.6 10*3/uL (ref 1.4–7.7)
Neutrophils Relative %: 73.9 % (ref 43.0–77.0)
Platelets: 305 10*3/uL (ref 150.0–400.0)
RBC: 4.97 Mil/uL (ref 4.22–5.81)
RDW: 13.8 % (ref 11.5–15.5)
WBC: 10.2 10*3/uL (ref 4.0–10.5)

## 2017-11-18 LAB — COMPREHENSIVE METABOLIC PANEL
ALT: 14 U/L (ref 0–53)
AST: 17 U/L (ref 0–37)
Albumin: 4.4 g/dL (ref 3.5–5.2)
Alkaline Phosphatase: 119 U/L — ABNORMAL HIGH (ref 39–117)
BUN: 12 mg/dL (ref 6–23)
CALCIUM: 9.5 mg/dL (ref 8.4–10.5)
CHLORIDE: 91 meq/L — AB (ref 96–112)
CO2: 30 meq/L (ref 19–32)
CREATININE: 0.83 mg/dL (ref 0.40–1.50)
GFR: 103.06 mL/min (ref >60.00)
Glucose, Bld: 136 mg/dL — ABNORMAL HIGH (ref 70–99)
Potassium: 4.8 mEq/L (ref 3.5–5.1)
Sodium: 129 mEq/L — ABNORMAL LOW (ref 135–145)
Total Bilirubin: 0.7 mg/dL (ref 0.2–1.2)
Total Protein: 7.3 g/dL (ref 6.0–8.3)

## 2017-11-18 NOTE — Addendum Note (Signed)
Addended by: Regan Rakers on: 11/18/2017 08:57 AM   Modules accepted: Orders

## 2017-11-18 NOTE — Progress Notes (Signed)
Office Note 11/18/2017  CC:  Chief Complaint  Patient presents with  . Annual Exam    HPI:  Daniel Stevenson is a 52 y.o. White male who is here for annual health maintenance exam. Feeling well, no complaints. He wants me to restart management of his DM and Hypoth at next f/u visit in 3 mo-->specialist f/u/mgmt too costly. Exercise: walking, active all the time. Diet: eats good diabetic diet--most recent HbA1c 7.4% about 10d ago.   Past Medical History:  Diagnosis Date  . Back pain    s/p back surgery  . Chronic hyponatremia 08/2012 onset   Onset was right after starting amitriptyline for his neuropathic leg pain--has been stable.  Likely ADH-like effect on kidney from amitriptyline.  Marland Kitchen History of low back pain 06/24/2011  . Hyperlipidemia   . Hypothyroidism   . LADA (latent autoimmune diabetes in adults), managed as type 1 (HCC) Dx'd 2014   Dr. Morrison Old.  . Migraines   . Neuropathic pain of right lower extremity   . PLANTAR WART, RIGHT 10/09/2009  . Tobacco dependence    quit 2012; restarted not long after    Past Surgical History:  Procedure Laterality Date  . BACK SURGERY  2009   lower lumbar fusion- rubber disc in back  . CARDIOVASCULAR STRESS TEST  03/21/14   No ischemia/low risk study; however, LV function showed EF 49% and global LV hypokinesis--echo recommended and it showed normal LV fxn.  Marland Kitchen NASAL SINUS SURGERY  2007  . TRANSTHORACIC ECHOCARDIOGRAM  03/24/14   Normal LV fxn, EF 50-55%, no wall motion abnormalities: (grade 1 diast dysfxn)    Family History  Problem Relation Age of Onset  . Emphysema Mother        smoker  . Obesity Father     Social History   Socioeconomic History  . Marital status: Married    Spouse name: Not on file  . Number of children: Not on file  . Years of education: Not on file  . Highest education level: Not on file  Occupational History  . Not on file  Social Needs  . Financial resource strain: Not on file  . Food  insecurity:    Worry: Not on file    Inability: Not on file  . Transportation needs:    Medical: Not on file    Non-medical: Not on file  Tobacco Use  . Smoking status: Current Every Day Smoker    Packs/day: 1.00    Years: 35.00    Pack years: 35.00    Types: Cigarettes    Last attempt to quit: 12/19/2010    Years since quitting: 6.9  . Smokeless tobacco: Never Used  Substance and Sexual Activity  . Alcohol use: Yes    Comment: rarely  . Drug use: No  . Sexual activity: Never  Lifestyle  . Physical activity:    Days per week: Not on file    Minutes per session: Not on file  . Stress: Not on file  Relationships  . Social connections:    Talks on phone: Not on file    Gets together: Not on file    Attends religious service: Not on file    Active member of club or organization: Not on file    Attends meetings of clubs or organizations: Not on file    Relationship status: Not on file  . Intimate partner violence:    Fear of current or ex partner: Not on file    Emotionally  abused: Not on file    Physically abused: Not on file    Forced sexual activity: Not on file  Other Topics Concern  . Not on file  Social History Narrative   Married, 1 biologic child, 3 step children (all grown).   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   Smoker: 1-2 ppd x 17 yrs.  Rare ETOH, no drugs.   No exercise.          Outpatient Medications Prior to Visit  Medication Sig Dispense Refill  . amitriptyline (ELAVIL) 25 MG tablet TAKE 1 TABLET BY MOUTH IN THE MORNING AND 3 TABS AT BEDTIME FOR RIGHT LEG PAIN. 120 tablet 11  . Continuous Blood Gluc Sensor (FREESTYLE LIBRE SENSOR SYSTEM) MISC by Does not apply route.    . Continuous Blood Gluc Sensor (FREESTYLE LIBRE SENSOR SYSTEM) MISC USE 1 EACH BY DOES NOT APPLY ROUTE EVERY 10 DAYS.  99  . fluticasone (FLONASE) 50 MCG/ACT nasal spray PLACE 2 SPRAYS INTO BOTH NOSTRILS DAILY. 16 g 5  . Insulin Glargine (LANTUS  SOLOSTAR) 100 UNIT/ML Solostar Pen Inject 15 Units into the skin at bedtime.     . Insulin Pen Needle (INSUPEN PEN NEEDLES) 32G X 4 MM MISC (BD Ultra-fine needles) Use 4 times daily as needed.  Dx:E11.65 100 each 11  . NOVOLOG FLEXPEN 100 UNIT/ML FlexPen INJECT 5 UNITS SUBCUTANEOUS BEFORE MEALS 15 mL 1  . SYNTHROID 137 MCG tablet Take 1 tablet every day expect for Mondays, Wednesdays and Fridays take 1.5 tablets (Patient taking differently: Take 175 mcg by mouth daily before breakfast. ) 35 tablet 2  . Vitamin D, Ergocalciferol, (DRISDOL) 50000 units CAPS capsule Take 1 capsule by mouth once a week.    . cyclobenzaprine (FLEXERIL) 10 MG tablet Take 1 tablet (10 mg total) by mouth 3 (three) times daily as needed for muscle spasms. (Patient not taking: Reported on 11/18/2017) 30 tablet 0  . predniSONE (DELTASONE) 20 MG tablet 2 tabs po qd x 5d, then 1 tab po qd x 5d (Patient not taking: Reported on 11/18/2017) 15 tablet 0   No facility-administered medications prior to visit.     Allergies  Allergen Reactions  . Augmentin [Amoxicillin-Pot Clavulanate] Nausea And Vomiting  . Oxycodone-Acetaminophen     REACTION: itching  . Levothyroxine Rash    Had rash associated with generic levothyroxine, but not brand-name Synthroid.  Likely allergic to a dye or filler in the generic preparation.    ROS Review of Systems  Constitutional: Negative for appetite change, chills, fatigue and fever.  HENT: Negative for congestion, dental problem, ear pain and sore throat.   Eyes: Negative for discharge, redness and visual disturbance.  Respiratory: Negative for cough, chest tightness, shortness of breath and wheezing.   Cardiovascular: Negative for chest pain, palpitations and leg swelling.  Gastrointestinal: Negative for abdominal pain, blood in stool, diarrhea, nausea and vomiting.  Genitourinary: Negative for difficulty urinating, dysuria, flank pain, frequency, hematuria and urgency.  Musculoskeletal:  Negative for arthralgias, back pain, joint swelling, myalgias and neck stiffness.  Skin: Negative for pallor and rash.  Neurological: Negative for dizziness, speech difficulty, weakness and headaches.  Hematological: Negative for adenopathy. Does not bruise/bleed easily.  Psychiatric/Behavioral: Negative for confusion and sleep disturbance. The patient is not nervous/anxious.     PE; Blood pressure 125/85, pulse 81, temperature 97.9 F (36.6 C), temperature source Oral, resp. rate 16, height 5' 4.75" (1.645 m), weight 147 lb (66.7 kg), SpO2 100 %. Gen: Alert,  well appearing.  Patient is oriented to person, place, time, and situation. AFFECT: pleasant, lucid thought and speech. ENT: Ears: EACs clear, normal epithelium.  TMs with good light reflex and landmarks bilaterally.  Eyes: no injection, icteris, swelling, or exudate.  EOMI, PERRLA. Nose: no drainage or turbinate edema/swelling.  No injection or focal lesion.  Mouth: lips without lesion/swelling.  Oral mucosa pink and moist.  Dentition intact and without obvious caries or gingival swelling.  Oropharynx without erythema, exudate, or swelling.  Neck: supple/nontender.  No LAD, mass, or TM.  Carotid pulses 2+ bilaterally, without bruits. CV: RRR, no m/r/g.   LUNGS: CTA bilat, nonlabored resps, good aeration in all lung fields. ABD: soft, NT, ND, BS normal.  No hepatospenomegaly or mass.  No bruits. EXT: no clubbing, cyanosis, or edema.  Musculoskeletal: no joint swelling, erythema, warmth, or tenderness.  ROM of all joints intact. Skin - no sores or suspicious lesions or rashes or color changes Rectal exam: negative without mass, lesions or tenderness, PROSTATE EXAM: smooth and symmetric without nodules or tenderness.   Pertinent labs:  Lab Results  Component Value Date   TSH 5.42 (H) 04/22/2017   Lab Results  Component Value Date   WBC 6.4 03/20/2017   HGB 16.0 03/20/2017   HCT 46.1 03/20/2017   MCV 89.9 03/20/2017   PLT 235.0  03/20/2017   Lab Results  Component Value Date   CREATININE 0.86 04/22/2017   BUN 19 04/22/2017   NA 129 (L) 04/22/2017   K 4.7 04/22/2017   CL 91 (L) 04/22/2017   CO2 30 04/22/2017   Lab Results  Component Value Date   ALT 14 11/14/2016   AST 13 11/14/2016   ALKPHOS 116 11/14/2016   BILITOT 1.0 11/14/2016   Lab Results  Component Value Date   CHOL 165 11/14/2016   Lab Results  Component Value Date   HDL 69.30 11/14/2016   Lab Results  Component Value Date   LDLCALC 83 11/14/2016   Lab Results  Component Value Date   TRIG 61.0 11/14/2016   Lab Results  Component Value Date   CHOLHDL 2 11/14/2016   Lab Results  Component Value Date   PSA 0.26 11/14/2016   Lab Results  Component Value Date   HGBA1C 7.4 (H) 05/15/2016     ASSESSMENT AND PLAN:   Health maintenance exam: Reviewed age and gender appropriate health maintenance issues (prudent diet, regular exercise, health risks of tobacco and excessive alcohol, use of seatbelts, fire alarms in home, use of sunscreen).  Also reviewed age and gender appropriate health screening as well as vaccine recommendations. Vaccines: Flu-->given today.   Tdap-->given today.   Shingrix--> deferred until next o/v. Labs: fasting HP (w/out TSH) Prostate ca screening: DRE normal , PSA. Colon ca screening:  GI referral ordered again today.  DM and hypoth: these have been managed by endo/Dr. Morrison Old, but due to cost pt wants to have me resume care of these problems at next f/u in 3 mo.  An After Visit Summary was printed and given to the patient.  FOLLOW UP:  Return in about 3 months (around 02/18/2018) for f/u DM/hypoth/HLD.  Signed:  Santiago Bumpers, MD           11/18/2017

## 2017-11-18 NOTE — Patient Instructions (Signed)

## 2017-11-19 ENCOUNTER — Encounter: Payer: Self-pay | Admitting: Family Medicine

## 2017-11-19 ENCOUNTER — Other Ambulatory Visit: Payer: Self-pay | Admitting: Family Medicine

## 2017-12-22 ENCOUNTER — Encounter: Payer: Self-pay | Admitting: Family Medicine

## 2018-01-01 ENCOUNTER — Other Ambulatory Visit: Payer: Self-pay | Admitting: Family Medicine

## 2018-01-02 NOTE — Telephone Encounter (Signed)
RF request for amitriptyline LOV: 11/18/17 Next ov: 02/19/18 Last written: 12/02/16 #120 w/ 11RF  Please advise. Thanks.

## 2018-02-16 ENCOUNTER — Encounter: Payer: Self-pay | Admitting: Family Medicine

## 2018-02-16 ENCOUNTER — Ambulatory Visit: Payer: BLUE CROSS/BLUE SHIELD | Admitting: Family Medicine

## 2018-02-16 ENCOUNTER — Other Ambulatory Visit: Payer: Self-pay

## 2018-02-16 ENCOUNTER — Encounter: Payer: Self-pay | Admitting: Emergency Medicine

## 2018-02-16 VITALS — BP 133/90 | HR 93 | Temp 98.3°F | Resp 14 | Ht 65.0 in | Wt 147.0 lb

## 2018-02-16 DIAGNOSIS — H6121 Impacted cerumen, right ear: Secondary | ICD-10-CM

## 2018-02-16 MED ORDER — CARBAMIDE PEROXIDE 6.5 % OT SOLN: OTIC | 1 refills | Status: DC

## 2018-02-16 NOTE — Progress Notes (Addendum)
Daniel Stevenson , 07/05/1965, 53 y.o., male MRN: [id number] Patient Care Team    Relationship Specialty Notifications Start End  McGowen, Maryjean Morn, MD PCP - General Family Medicine  11/20/10   Warden Fillers, MD Consulting Physician Endocrinology  07/10/16     Chief Complaint  Patient presents with  . Hearing Loss     Subjective: Pt presents for an OV with complaints of right hearing loss of 3 days duration.  Associated symptoms include dizziness if turns head quickly.  Pt has tried nothing to ease their symptoms. He denies ringing of his ears. He has a headache, that is chronic for him. He reports it is not worse than usual today.   Depression screen [facility] 2/9 11/18/2017 11/14/2016  Decreased Interest 0 0  Down, Depressed, Hopeless 0 0  PHQ - 2 Score 0 0    Allergies  Allergen Reactions  . Augmentin [Amoxicillin-Pot Clavulanate] Nausea And Vomiting  . Oxycodone-Acetaminophen     REACTION: itching  . Levothyroxine Rash    Had rash associated with generic levothyroxine, but not brand-name Synthroid.  Likely allergic to a dye or filler in the generic preparation.   Social History   Tobacco Use  . Smoking status: Current Every Day Smoker    Packs/day: 1.00    Years: 35.00    Pack years: 35.00    Types: Cigarettes    Last attempt to quit: 12/19/2010    Years since quitting: 7.1  . Smokeless tobacco: Never Used  Substance Use Topics  . Alcohol use: Yes    Comment: rarely   Past Medical History:  Diagnosis Date  . Back pain    s/p back surgery  . Chronic hyponatremia 08/2012 onset   Onset was right after starting amitriptyline for his neuropathic leg pain--has been stable.  Likely ADH-like effect on kidney from amitriptyline.  Marland Kitchen History of low back pain 06/24/2011  . Hyperlipidemia   . Hypothyroidism   . LADA (latent autoimmune diabetes in adults), managed as type 1 (HCC) Dx'd 2014   Dr. Morrison Old.  . Migraines   . Neuropathic pain of right lower extremity   . PLANTAR  WART, RIGHT 10/09/2009  . Tobacco dependence    quit 2012; restarted not long after   Past Surgical History:  Procedure Laterality Date  . BACK SURGERY  2009   lower lumbar fusion- rubber disc in back  . CARDIOVASCULAR STRESS TEST  03/21/14   No ischemia/low risk study; however, LV function showed EF 49% and global LV hypokinesis--echo recommended and it showed normal LV fxn.  Marland Kitchen NASAL SINUS SURGERY  2007  . TRANSTHORACIC ECHOCARDIOGRAM  03/24/14   Normal LV fxn, EF 50-55%, no wall motion abnormalities: (grade 1 diast dysfxn)   Family History  Problem Relation Age of Onset  . Emphysema Mother        smoker  . Obesity Father    Allergies as of 02/16/2018      Reactions   Augmentin [amoxicillin-pot Clavulanate] Nausea And Vomiting   Oxycodone-acetaminophen    REACTION: itching   Levothyroxine Rash   Had rash associated with generic levothyroxine, but not brand-name Synthroid.  Likely allergic to a dye or filler in the generic preparation.      Medication List       Accurate as of February 16, 2018  3:39 PM. Always use your most recent med list.        amitriptyline 25 MG tablet Commonly known as:  ELAVIL TAKE 1 TABLET  BY MOUTH IN THE MORNING AND 3 TABS AT BEDTIME FOR RIGHT LEG PAIN.   fluticasone 50 MCG/ACT nasal spray Commonly known as:  FLONASE SPRAY 2 SPRAYS INTO EACH NOSTRIL EVERY DAY   FREESTYLE LIBRE SENSOR SYSTEM Misc by Does not apply route.   FREESTYLE LIBRE SENSOR SYSTEM Misc USE 1 EACH BY DOES NOT APPLY ROUTE EVERY 10 DAYS.   Insulin Pen Needle 32G X 4 MM Misc Commonly known as:  INSUPEN PEN NEEDLES (BD Ultra-fine needles) Use 4 times daily as needed.  Dx:E11.65   LANTUS SOLOSTAR 100 UNIT/ML Solostar Pen Generic drug:  Insulin Glargine Inject 15 Units into the skin at bedtime.   NOVOLOG FLEXPEN 100 UNIT/ML FlexPen Generic drug:  insulin aspart INJECT 5 UNITS SUBCUTANEOUS BEFORE MEALS   SYNTHROID 137 MCG tablet Generic drug:  levothyroxine Take 1 tablet  every day expect for Mondays, Wednesdays and Fridays take 1.5 tablets       All past medical history, surgical history, allergies, family history, immunizations andmedications were updated in the EMR today and reviewed under the history and medication portions of their EMR.     ROS: Negative, with the exception of above mentioned in HPI   Objective:  BP 133/90   Pulse 93   Temp 98.3 F (36.8 C) (Oral)   Resp 14   Ht 5\' 5"  (1.651 m)   Wt 147 lb (66.7 kg)   SpO2 98%   BMI 24.46 kg/m  Body mass index is 24.46 kg/m. Gen: Afebrile. No acute distress. Nontoxic in appearance, well developed, well nourished.  HENT: AT. [place]. Bilateral TM unable to be visualized 2/2 to cerumen impaction. MMM Eyes:Pupils Equal Round Reactive to light, Extraocular movements intact,  Conjunctiva without redness, discharge or icterus. Neuro: Normal gait. PERLA. EOMi. Alert. Oriented x3   No exam data present No results found. No results found for this or any previous visit (from the past 24 hour(s)).  Assessment/Plan: Daniel Stevenson is a 53 y.o. male present for OV for  Hearing loss of right ear due to cerumen impaction - cerumen was successfully irrigated/lavaged today of the right ear. Right TM intact, mildly irritated from cerumen pressure.  - Left TM still unable to be visualized- debrox solution prescribed with instructions on current use and maintenance use.  - hearing loss resolved immediately after irrigation. Pt tolerated procedure well. - headache and dizziness- headache reported as not worse than usual. Advised if headache worsens or does not improve- or dizziness returns despite cerumen removal today--> follow up with PCP.  Pt reports he felt improved with lavage. - F/u PRN    Reviewed expectations re: course of current medical issues.  Discussed self-management of symptoms.  Outlined signs and symptoms indicating need for more acute intervention.  Patient verbalized understanding and  all questions were answered.  Patient received an After-Visit Summary.   > 25 minutes spent with patient, >50% of time spent face to face   No orders of the defined types were placed in this encounter.    Note is dictated utilizing voice recognition software. Although note has been proof read prior to signing, occasional typographical errors still can be missed. If any questions arise, please do not hesitate to call for verification.   electronically signed by:  Felix Pacini, DO  [facility] [place]

## 2018-02-16 NOTE — Patient Instructions (Signed)
I have called in the debrox for you. Use it in the left ear next couple of days to help clean out remaining wax. Then you can use a few drops each ear every 2-4 weeks to help keep wax from building up.    Earwax Buildup, Adult The ears produce a substance called earwax that helps keep bacteria out of the ear and protects the skin in the ear canal. Occasionally, earwax can build up in the ear and cause discomfort or hearing loss. What increases the risk? This condition is more likely to develop in people who:  Are male.  Are elderly.  Naturally produce more earwax.  Clean their ears often with cotton swabs.  Use earplugs often.  Use in-ear headphones often.  Wear hearing aids.  Have narrow ear canals.  Have earwax that is overly thick or sticky.  Have eczema.  Are dehydrated.  Have excess hair in the ear canal. What are the signs or symptoms? Symptoms of this condition include:  Reduced or muffled hearing.  A feeling of fullness in the ear or feeling that the ear is plugged.  Fluid coming from the ear.  Ear pain.  Ear itch.  Ringing in the ear.  Coughing.  An obvious piece of earwax that can be seen inside the ear canal. How is this diagnosed? This condition may be diagnosed based on:  Your symptoms.  Your medical history.  An ear exam. During the exam, your health care provider will look into your ear with an instrument called an otoscope. You may have tests, including a hearing test. How is this treated? This condition may be treated by:  Using ear drops to soften the earwax.  Having the earwax removed by a health care provider. The health care provider may: ? Flush the ear with water. ? Use an instrument that has a loop on the end (curette). ? Use a suction device.  Surgery to remove the wax buildup. This may be done in severe cases. Follow these instructions at home:   Take over-the-counter and prescription medicines only as told by your  health care provider.  Do not put any objects, including cotton swabs, into your ear. You can clean the opening of your ear canal with a washcloth or facial tissue.  Follow instructions from your health care provider about cleaning your ears. Do not over-clean your ears.  Drink enough fluid to keep your urine clear or pale yellow. This will help to thin the earwax.  Keep all follow-up visits as told by your health care provider. If earwax builds up in your ears often or if you use hearing aids, consider seeing your health care provider for routine, preventive ear cleanings. Ask your health care provider how often you should schedule your cleanings.  If you have hearing aids, clean them according to instructions from the manufacturer and your health care provider. Contact a health care provider if:  You have ear pain.  You develop a fever.  You have blood, pus, or other fluid coming from your ear.  You have hearing loss.  You have ringing in your ears that does not go away.  Your symptoms do not improve with treatment.  You feel like the room is spinning (vertigo). Summary  Earwax can build up in the ear and cause discomfort or hearing loss.  The most common symptoms of this condition include reduced or muffled hearing and a feeling of fullness in the ear or feeling that the ear is plugged.  This condition may be diagnosed based on your symptoms, your medical history, and an ear exam.  This condition may be treated by using ear drops to soften the earwax or by having the earwax removed by a health care provider.  Do not put any objects, including cotton swabs, into your ear. You can clean the opening of your ear canal with a washcloth or facial tissue. This information is not intended to replace advice given to you by your health care provider. Make sure you discuss any questions you have with your health care provider. Document Released: 03/07/2004 Document Revised: 01/09/2017  Document Reviewed: 04/10/2016 Elsevier Interactive Patient Education  2019 [employer].

## 2018-02-17 ENCOUNTER — Encounter: Payer: Self-pay | Admitting: Family Medicine

## 2018-02-19 ENCOUNTER — Ambulatory Visit: Payer: BLUE CROSS/BLUE SHIELD | Admitting: Family Medicine

## 2018-02-19 ENCOUNTER — Encounter: Payer: Self-pay | Admitting: Family Medicine

## 2018-02-19 VITALS — BP 132/78 | HR 87 | Temp 98.2°F | Resp 16 | Ht 65.0 in | Wt 148.0 lb

## 2018-02-19 DIAGNOSIS — E118 Type 2 diabetes mellitus with unspecified complications: Secondary | ICD-10-CM | POA: Diagnosis not present

## 2018-02-19 DIAGNOSIS — E039 Hypothyroidism, unspecified: Secondary | ICD-10-CM

## 2018-02-19 DIAGNOSIS — E7800 Pure hypercholesterolemia, unspecified: Secondary | ICD-10-CM

## 2018-02-19 DIAGNOSIS — R51 Headache: Secondary | ICD-10-CM

## 2018-02-19 DIAGNOSIS — G4719 Other hypersomnia: Secondary | ICD-10-CM

## 2018-02-19 DIAGNOSIS — E871 Hypo-osmolality and hyponatremia: Secondary | ICD-10-CM

## 2018-02-19 DIAGNOSIS — G4733 Obstructive sleep apnea (adult) (pediatric): Secondary | ICD-10-CM

## 2018-02-19 DIAGNOSIS — R519 Headache, unspecified: Secondary | ICD-10-CM

## 2018-02-19 MED ORDER — ATORVASTATIN CALCIUM 20 MG PO TABS: 20.0000 mg | ORAL_TABLET | Freq: Every day | ORAL | 1 refills | Status: DC

## 2018-02-19 NOTE — Progress Notes (Signed)
OFFICE VISIT  02/19/2018   CC:  Chief Complaint  Patient presents with  . Follow-up    RCI    HPI:    Patient is a 53 y.o. Caucasian male who presents for 3 mo f/u DM 2, hypothyroidism, HLD.  DM: using librae sensor; fastings aroun 100-120.  Occ has hypoglycemia early in morning, about 1 time per week.   On 14 U lantus qhs and 5 U novolog qAC.   He says his diet is very good lately.  He adds 1 U novolog if preprandial glucose is >200.  Hypoth: he takes 175 mcg qd but skips sundays.  Last TSH was around 6.5, but T4 was wnl, so endocrinologist did not change his dose.  Takes it by itself on empty stomach about 1 hr prior to eating.  HLD: He is not on a statin at this time, was on atorva 20mg  qd at one time, unclear why he got off it but he says it did not cause any side effects.  Last lipid panel 3 mo ago was great.  He wakes up with a HA on some mornings, 3 out of 7 days a week now.  No connection with glucose levels.  Wife says the nights before these mornings he snores much worse than normal.  She has not commented on any apneic spells.  No awakening from sleep with a gasp.  He does have some excessive daytime sleepiness. Quality of sleep questionnaire today, score =7, high risk of OSA.  Past Medical History:  Diagnosis Date  . Back pain    s/p back surgery  . Chronic hyponatremia 08/2012 onset   Onset was right after starting amitriptyline for his neuropathic leg pain--has been stable.  Likely ADH-like effect on kidney from amitriptyline.  Marland Kitchen. History of low back pain 06/24/2011  . Hyperlipidemia   . Hypothyroidism   . LADA (latent autoimmune diabetes in adults), managed as type 1 (HCC) Dx'd 2014   Dr. Morrison [place].  . Migraines   . Neuropathic pain of right lower extremity   . PLANTAR WART, RIGHT 10/09/2009  . Tobacco dependence    quit 2012; restarted not long after    Past Surgical History:  Procedure Laterality Date  . BACK SURGERY  2009   lower lumbar fusion- rubber disc  in back  . CARDIOVASCULAR STRESS TEST  03/21/14   No ischemia/low risk study; however, LV function showed EF 49% and global LV hypokinesis--echo recommended and it showed normal LV fxn.  Marland Kitchen. NASAL SINUS SURGERY  2007  . TRANSTHORACIC ECHOCARDIOGRAM  03/24/14   Normal LV fxn, EF 50-55%, no wall motion abnormalities: (grade 1 diast dysfxn)    Outpatient Medications Prior to Visit  Medication Sig Dispense Refill  . amitriptyline (ELAVIL) 25 MG tablet TAKE 1 TABLET BY MOUTH IN THE MORNING AND 3 TABS AT BEDTIME FOR RIGHT LEG PAIN. 120 tablet 11  . carbamide peroxide (DEBROX) 6.5 % OTIC solution Use twice a day in the left ear, until cleaned. The can use 1-2 drops every 2 weeks each ear to maintain. 15 mL 1  . Continuous Blood Gluc Sensor (FREESTYLE LIBRE SENSOR SYSTEM) MISC by Does not apply route.    . Continuous Blood Gluc Sensor (FREESTYLE LIBRE SENSOR SYSTEM) MISC USE 1 EACH BY DOES NOT APPLY ROUTE EVERY 10 DAYS.  99  . fluticasone (FLONASE) 50 MCG/ACT nasal spray SPRAY 2 SPRAYS INTO EACH NOSTRIL EVERY DAY 16 g 5  . Insulin Glargine (LANTUS SOLOSTAR) 100 UNIT/ML Solostar Pen Inject  15 Units into the skin at bedtime.     . Insulin Pen Needle (INSUPEN PEN NEEDLES) 32G X 4 MM MISC (BD Ultra-fine needles) Use 4 times daily as needed.  Dx:E11.65 100 each 11  . NOVOLOG FLEXPEN 100 UNIT/ML FlexPen INJECT 5 UNITS SUBCUTANEOUS BEFORE MEALS 15 mL 1  . SYNTHROID 137 MCG tablet Take 1 tablet every day expect for Mondays, Wednesdays and Fridays take 1.5 tablets (Patient taking differently: Take 175 mcg by mouth daily before breakfast. ) 35 tablet 2   No facility-administered medications prior to visit.     Allergies  Allergen Reactions  . Augmentin [Amoxicillin-Pot Clavulanate] Nausea And Vomiting  . Oxycodone-Acetaminophen     REACTION: itching  . Levothyroxine Rash    Had rash associated with generic levothyroxine, but not brand-name Synthroid.  Likely allergic to a dye or filler in the generic  preparation.    ROS As per HPI  PE: initial bp today with automated bp machine was 142/88.  Recheck at end of visit with manual cuff was 132/78. Blood pressure (!) 142/88, pulse 87, temperature 98.2 F (36.8 C), temperature source Oral, resp. rate 16, height 5\' 5"  (1.651 m), weight 148 lb (67.1 kg), SpO2 99 %. Gen: Alert, well appearing.  Patient is oriented to person, place, time, and situation. AFFECT: pleasant, lucid thought and speech. No further exam today.  LABS:  Lab Results  Component Value Date   TSH 5.42 (H) 04/22/2017   Lab Results  Component Value Date   WBC 10.2 11/18/2017   HGB 15.5 11/18/2017   HCT 45.5 11/18/2017   MCV 91.6 11/18/2017   PLT 305.0 11/18/2017   Lab Results  Component Value Date   CREATININE 0.83 11/18/2017   BUN 12 11/18/2017   NA 129 (L) 11/18/2017   K 4.8 11/18/2017   CL 91 (L) 11/18/2017   CO2 30 11/18/2017   Lab Results  Component Value Date   ALT 14 11/18/2017   AST 17 11/18/2017   ALKPHOS 119 (H) 11/18/2017   BILITOT 0.7 11/18/2017   Lab Results  Component Value Date   CHOL 142 11/18/2017   Lab Results  Component Value Date   HDL 61.90 11/18/2017   Lab Results  Component Value Date   LDLCALC 65 11/18/2017   Lab Results  Component Value Date   TRIG 75.0 11/18/2017   Lab Results  Component Value Date   CHOLHDL 2 11/18/2017   Lab Results  Component Value Date   PSA 0.23 11/18/2017   PSA 0.26 11/14/2016   Lab Results  Component Value Date   HGBA1C 7.4 (A) 11/04/2017    IMPRESSION AND PLAN:  1) DM 2, good control. Continue current insulin regimen, be quick to lower supper humalog dose to avoid nighttime/early morning hypoglycemia.  Don't "chase" glucoses. HbA1c today.  2) HLD: he was on atorva 20mg  qd at one time, likely self d/c'd when his cholesterol came down. Discussed benefit of statin in pt's with DM2 regardless of lipid levels.  Restarted atorva 20mg  qd today.  3) Hypothyroidism: last TSH a little  high but T4 normal. Recheck these labs today.  He is definitely taking his med correctly.  4) Morning headaches, with excessive daytime somnolence: refer to neurology sleep specialist to further eval for OSA.  5) Chronic hyponatremia: suspected to be secondary to ADH-like effects of amitriptyline. BMET today.  An After Visit Summary was printed and given to the patient.  FOLLOW UP: Return in about 3 months (around 05/21/2018) for  routine chronic illness f/u.  Signed:  Santiago BumpersPhil Oliveah Zwack, MD           02/19/2018

## 2018-02-20 LAB — BASIC METABOLIC PANEL
BUN: 18 mg/dL (ref 6–23)
CHLORIDE: 94 meq/L — AB (ref 96–112)
CO2: 27 meq/L (ref 19–32)
Calcium: 9.2 mg/dL (ref 8.4–10.5)
Creatinine, Ser: 0.98 mg/dL (ref 0.40–1.50)
GFR: 85 mL/min (ref >60.00)
Glucose, Bld: 126 mg/dL — ABNORMAL HIGH (ref 70–99)
POTASSIUM: 4.2 meq/L (ref 3.5–5.1)
SODIUM: 128 meq/L — AB (ref 135–145)

## 2018-02-20 LAB — TSH: TSH: 5.32 u[IU]/mL — ABNORMAL HIGH (ref 0.35–4.50)

## 2018-02-20 LAB — T4, FREE: Free T4: 1.02 ng/dL (ref 0.60–1.60)

## 2018-02-20 LAB — HEMOGLOBIN A1C: Hgb A1c MFr Bld: 8.1 % — ABNORMAL HIGH (ref 4.6–6.5)

## 2018-03-09 ENCOUNTER — Other Ambulatory Visit: Payer: Self-pay | Admitting: Family Medicine

## 2018-04-06 ENCOUNTER — Encounter: Payer: Self-pay | Admitting: Neurology

## 2018-04-06 ENCOUNTER — Ambulatory Visit: Payer: BLUE CROSS/BLUE SHIELD | Admitting: Neurology

## 2018-04-06 VITALS — BP 136/92 | HR 91 | Ht 65.0 in | Wt 144.0 lb

## 2018-04-06 DIAGNOSIS — G478 Other sleep disorders: Secondary | ICD-10-CM

## 2018-04-06 DIAGNOSIS — R0683 Snoring: Secondary | ICD-10-CM

## 2018-04-06 DIAGNOSIS — G4719 Other hypersomnia: Secondary | ICD-10-CM | POA: Diagnosis not present

## 2018-04-06 DIAGNOSIS — R51 Headache: Secondary | ICD-10-CM | POA: Diagnosis not present

## 2018-04-06 DIAGNOSIS — R351 Nocturia: Secondary | ICD-10-CM

## 2018-04-06 DIAGNOSIS — R519 Headache, unspecified: Secondary | ICD-10-CM

## 2018-04-06 NOTE — Patient Instructions (Signed)
Thank you for choosing [facility] for your sleep related care! It was nice to meet you today! I appreciate that you entrust me with your sleep related healthcare concerns. I hope, I was able to address at least some of your concerns today, and that I can help you feel reassured and also get better.    Here is what we discussed today and what we came up with as our plan for you:   1. Please try to limit your caffeine/soda intake to 1-2 servings per day.  2. Please avoid BC powder or Excedrin on a daily basis as this can perpetuate headaches.  3. Based on your symptoms and your exam I believe you are at risk for obstructive sleep apnea (aka OSA), and I think we should proceed with a sleep study to determine whether you do or do not have OSA and how severe it is. Even, if you have mild OSA, I may want you to consider treatment with CPAP, as treatment of even borderline or mild sleep apnea can result and improvement of symptoms such as sleep disruption, daytime sleepiness, nighttime bathroom breaks, restless leg symptoms, improvement of headache syndromes, even improved mood disorder.   Please remember, the long-term risks and ramifications of untreated moderate to severe obstructive sleep apnea are: increased Cardiovascular disease, including congestive heart failure, stroke, difficult to control hypertension, treatment resistant obesity, arrhythmias, especially irregular heartbeat commonly known as A. Fib. (atrial fibrillation); even type 2 diabetes has been linked to untreated OSA.   Sleep apnea can cause disruption of sleep and sleep deprivation in most cases, which, in turn, can cause recurrent headaches, problems with memory, mood, concentration, focus, and vigilance. Most people with untreated sleep apnea report excessive daytime sleepiness, which can affect their ability to drive. Please do not drive if you feel sleepy. Patients with sleep apnea developed difficulty initiating and  maintaining sleep (aka insomnia).   Having sleep apnea may increase your risk for other sleep disorders, including involuntary behaviors sleep such as sleep terrors, sleep talking, sleepwalking.    Having sleep apnea can also increase your risk for restless leg syndrome and leg movements at night.   Please note that untreated obstructive sleep apnea may carry additional perioperative morbidity. Patients with significant obstructive sleep apnea (typically, in the moderate to severe degree) should receive, if possible, perioperative PAP (positive airway pressure) therapy and the surgeons and particularly the anesthesiologists should be informed of the diagnosis and the severity of the sleep disordered breathing.   I will likely see you back after your sleep study to go over the test results and where to go from there. We will call you after your sleep study to advise about the results (most likely, you will hear from [place], my nurse) and to set up an appointment at the time, as necessary.    Our sleep lab administrative assistant will call you to schedule your sleep study and give you further instructions, regarding the check in process for the sleep study, arrival time, what to bring, when you can expect to leave after the study, etc., and to answer any other logistical questions you may have. If you don't hear back from her by about 2 weeks from now, please feel free to call her direct line at [phone] or you can call our general clinic number, or email [place] through My Chart.

## 2018-04-06 NOTE — Progress Notes (Signed)
Subjective:    Patient ID: Daniel Stevenson is a 53 y.o. male.  HPI     Huston Foley, MD, PhD [facility] [address], [address] [address] [place], [place] [address]  Dear Dr. Milinda Cave,   I saw your patient, Daniel Stevenson, upon your kind request in my sleep clinic today for initial consultation of his sleep disorder, in particular, concern for underlying obstructive sleep apnea. The patient is accompanied by his wife today. As you know, Mr. Daniel Stevenson is a 53 year old right-handed gentleman with an underlying medical history of diabetes, hypothyroidism, migraines, hyperlipidemia, low back pain with status post lumbar spine surgery, and smoking, who reports snoring and excessive daytime somnolence. I reviewed your office note from 02/19/2018. He has woken up with a headache. Denies a family history of migraines or sleep apnea. His Epworth sleepiness score is 6 out of 24 today, fatigue score is 45 out of 63. He does not typically wake up rested. He lives with his wife, they have 4 children. He smokes a pack per day, he does not utilize alcohol, he drinks caffeine in the form of diet soda, about 2 L per day. He reports waking up with a headache frequently. He attributed this in the past to lower blood sugar values. His recent A1c was elevated at 8.1 in January 2020 which is quite a bit jump from before. He has been taking BC powder or Excedrin once daily on average. He has nocturia about 2-3 times per average night. He has one large dog and 2 small dogs in the household, the larger dog often sleeps on the bed with him. He does watch TV in the bedroom but turns it off at night. He tries to keep a sleep schedule, typically in bed by 10 and rise time is around 5. He has multiple nighttime awakenings and has had sleep disruption for the past 4 or 5 years. Snoring has become worse in the past 1 or 2 years per wife. He has been on amitriptyline for leg pain for the past 5 years or so, he  take 75 mg at night. Sometimes he has leg jerking or hand muscle jerking as he drifts off to sleep per wife's report. He works in Public affairs consultant.   His Past Medical History Is Significant For: Past Medical History:  Diagnosis Date  . Back pain    s/p back surgery  . Chronic hyponatremia 08/2012 onset   Onset was right after starting amitriptyline for his neuropathic leg pain--has been stable.  Likely ADH-like effect on kidney from amitriptyline.  Marland Kitchen History of low back pain 06/24/2011  . Hyperlipidemia   . Hypothyroidism   . LADA (latent autoimmune diabetes in adults), managed as type 1 (HCC) Dx'd 2014   Dr. Morrison Old.  . Migraines   . Neuropathic pain of right lower extremity   . PLANTAR WART, RIGHT 10/09/2009  . Tobacco dependence    quit 2012; restarted not long after    His Past Surgical History Is Significant For: Past Surgical History:  Procedure Laterality Date  . BACK SURGERY  2009   lower lumbar fusion- rubber disc in back  . CARDIOVASCULAR STRESS TEST  03/21/14   No ischemia/low risk study; however, LV function showed EF 49% and global LV hypokinesis--echo recommended and it showed normal LV fxn.  Marland Kitchen NASAL SINUS SURGERY  2007  . TRANSTHORACIC ECHOCARDIOGRAM  03/24/14   Normal LV fxn, EF 50-55%, no wall motion abnormalities: (grade 1 diast dysfxn)  His Family History Is Significant For: Family History  Problem Relation Age of Onset  . Emphysema Mother        smoker  . Obesity Father     His Social History Is Significant For: Social History   Socioeconomic History  . Marital status: Married    Spouse name: Not on file  . Number of children: Not on file  . Years of education: Not on file  . Highest education level: Not on file  Occupational History  . Not on file  Social Needs  . Financial resource strain: Not on file  . Food insecurity:    Worry: Not on file    Inability: Not on file  . Transportation needs:    Medical: Not on file    Non-medical: Not  on file  Tobacco Use  . Smoking status: Current Every Day Smoker    Packs/day: 1.00    Years: 35.00    Pack years: 35.00    Types: Cigarettes    Last attempt to quit: 12/19/2010    Years since quitting: 7.3  . Smokeless tobacco: Never Used  Substance and Sexual Activity  . Alcohol use: Yes    Comment: rarely  . Drug use: No  . Sexual activity: Never  Lifestyle  . Physical activity:    Days per week: Not on file    Minutes per session: Not on file  . Stress: Not on file  Relationships  . Social connections:    Talks on phone: Not on file    Gets together: Not on file    Attends religious service: Not on file    Active member of club or organization: Not on file    Attends meetings of clubs or organizations: Not on file    Relationship status: Not on file  Other Topics Concern  . Not on file  Social History Narrative   Married, 1 biologic child, 3 step children (all grown).   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   Smoker: 1-2 ppd x 17 yrs.  Rare ETOH, no drugs.   No exercise.          His Allergies Are:  Allergies  Allergen Reactions  . Augmentin [Amoxicillin-Pot Clavulanate] Nausea And Vomiting  . Oxycodone-Acetaminophen     REACTION: itching  . Levothyroxine Rash    Had rash associated with generic levothyroxine, but not brand-name Synthroid.  Likely allergic to a dye or filler in the generic preparation.  :   His Current Medications Are:  Outpatient Encounter Medications as of 04/06/2018  Medication Sig  . amitriptyline (ELAVIL) 25 MG tablet TAKE 1 TABLET BY MOUTH IN THE MORNING AND 3 TABS AT BEDTIME FOR RIGHT LEG PAIN.  Marland Kitchen atorvastatin (LIPITOR) 20 MG tablet Take 1 tablet (20 mg total) by mouth daily.  . carbamide peroxide (DEBROX) 6.5 % OTIC solution Use twice a day in the left ear, until cleaned. The can use 1-2 drops every 2 weeks each ear to maintain.  . Continuous Blood Gluc Sensor (FREESTYLE LIBRE SENSOR SYSTEM) MISC by  Does not apply route.  . Continuous Blood Gluc Sensor (FREESTYLE LIBRE SENSOR SYSTEM) MISC USE 1 EACH BY DOES NOT APPLY ROUTE EVERY 10 DAYS.  . fluticasone (FLONASE) 50 MCG/ACT nasal spray SPRAY 2 SPRAYS INTO EACH NOSTRIL EVERY DAY  . Insulin Glargine (LANTUS SOLOSTAR) 100 UNIT/ML Solostar Pen Inject 15 Units into the skin at bedtime.   . Insulin Pen Needle 32G X 4 MM MISC (BD  ULTRA-FINE NEEDLES) USE 4 TIMES DAILY AS NEEDED. DX:E11.65  . NOVOLOG FLEXPEN 100 UNIT/ML FlexPen INJECT 5 UNITS SUBCUTANEOUS BEFORE MEALS  . SYNTHROID 137 MCG tablet Take 1 tablet every day expect for Mondays, Wednesdays and Fridays take 1.5 tablets (Patient taking differently: Take 175 mcg by mouth daily before breakfast. )   No facility-administered encounter medications on file as of 04/06/2018.   :  Review of Systems:  Out of a complete 14 point review of systems, all are reviewed and negative with the exception of these symptoms as listed below:  Review of Systems  Neurological:       Pt presents today to discuss his sleep. Pt has never had a sleep study but does endorse snoring. Pt wakes up with headaches.  Epworth Sleepiness Scale 0= would never doze 1= slight chance of dozing 2= moderate chance of dozing 3= high chance of dozing  Sitting and reading: 1 Watching TV: 2 Sitting inactive in a public place (ex. Theater or meeting): 0 As a passenger in a car for an hour without a break: 0 Lying down to rest in the afternoon: 3 Sitting and talking to someone: 0 Sitting quietly after lunch (no alcohol): 0 In a car, while stopped in traffic: 0 Total: 6     Objective:  Neurological Exam  Physical Exam Physical Examination:   Vitals:   04/06/18 1544  BP: (!) 136/92  Pulse: 91    General Examination: The patient is a very pleasant 53 y.o. male in no acute distress. He appears well-developed and well-nourished and well groomed.   HEENT: Normocephalic, atraumatic, pupils are equal, round and reactive  to light. Extraocular tracking is good without limitation to gaze excursion or nystagmus noted. Normal smooth pursuit is noted. Hearing is grossly intact. Face is symmetric with normal facial animation and normal facial sensation. Speech is clear with no dysarthria noted. There is no hypophonia. There is no lip, neck/head, jaw or voice tremor. Neck is supple with full range of passive and active motion. There are no carotid bruits on auscultation. Oropharynx exam reveals: mild mouth dryness, adequate dental hygiene with full dentures in place, moderate airway crowding secondary to smaller airway entry, redundant soft palate, tonsils are 1+ bilaterally. Mallampati is class II. Neck circumference is 15.75 in. Nasal inspection reveals no significant nasal mucosal bogginess or redness and no septal deviation. He is s/p septoplasty.  Chest: Clear to auscultation without wheezing, rhonchi or crackles noted.  Heart: S1+S2+0, regular and normal without murmurs, rubs or gallops noted.   Abdomen: Soft, non-tender and non-distended with normal bowel sounds appreciated on auscultation.  Extremities: There is no pitting edema in the distal lower extremities bilaterally. Pedal pulses are intact.  Skin: Warm and dry without trophic changes noted. Dry and cracked hands.  Musculoskeletal: exam reveals arthritic changes in both hands.   Neurologically:  Mental status: The patient is awake, alert and oriented in all 4 spheres. His immediate and remote memory, attention, language skills and fund of knowledge are appropriate. There is no evidence of aphasia, agnosia, apraxia or anomia. Speech is clear with normal prosody and enunciation. Thought process is linear. Mood is normal and affect is normal.  Cranial nerves II - XII are as described above under HEENT exam. In addition: shoulder shrug is normal with equal shoulder height noted. Motor exam: Normal bulk, strength and tone is noted. There is no tremor. Romberg is  negative. Fine motor skills and coordination: intact with normal finger taps, normal hand movements,  normal rapid alternating patting, normal foot taps and normal foot agility.  Cerebellar testing: No dysmetria or intention tremor.  Sensory exam: intact to light touch in the upper and lower extremities.  Gait, station and balance: He stands easily. No veering to one side is noted. No leaning to one side is noted. Posture is age-appropriate and stance is narrow based. Gait shows normal stride length and normal pace. No problems turning are noted. Tandem walk is unremarkable.               Assessment and Plan:  In summary, VLAD ASAI is a very pleasant 53 y.o.-year old male with an underlying medical history of diabetes, hypothyroidism, migraines, hyperlipidemia, low back pain with status post lumbar spine surgery, and smoking, whose history and physical exam are concerning for obstructive sleep apnea (OSA). I had a long chat with the patient and his wife about my findings and the diagnosis of OSA, its prognosis and treatment options. We talked about medical treatments, surgical interventions and non-pharmacological approaches. I explained in particular the risks and ramifications of untreated moderate to severe OSA, especially with respect to developing cardiovascular disease down the Road, including congestive heart failure, difficult to treat hypertension, cardiac arrhythmias, or stroke. Even type 2 diabetes has, in part, been linked to untreated OSA. Symptoms of untreated OSA include daytime sleepiness, memory problems, mood irritability and mood disorder such as depression and anxiety, lack of energy, as well as recurrent headaches, especially morning headaches. We talked about smoking cessation and trying to maintain a healthy lifestyle in general, as well as the importance of weight control. I encouraged the patient to eat healthy, exercise daily and keep well hydrated, to keep a scheduled bedtime  and wake time routine, to not skip any meals and eat healthy snacks in between meals. I advised the patient not to drive when feeling sleepy.he is advised to reduce his caffeine intake and limit his soda intake to 1-2 servings per day. He is reminded to stay well hydrated with water and avoid daily over-the-counter Excedrin or BC powder as daily use can perpetuate recurrent headaches. I recommended the following at this time: sleep study with potential positive airway pressure titration. (We will score hypopneas at 3%).   I explained the sleep test procedure to the patient and also outlined possible surgical and non-surgical treatment options of OSA, including the use of a custom-made dental device (which would require a referral to a specialist dentist or oral surgeon), upper airway surgical options, such as pillar implants, radiofrequency surgery, tongue base surgery, and UPPP (which would involve a referral to an ENT surgeon). Rarely, jaw surgery such as mandibular advancement may be considered.  I also explained the CPAP treatment option to the patient, who indicated that he would be willing to try CPAP if the need arises. I explained the importance of being compliant with PAP treatment, not only for insurance purposes but primarily to improve His symptoms, and for the patient's long term health benefit, including to reduce His cardiovascular risks. I answered all their questions today and the patient and his wife were in agreement. I plan to see him back after the sleep study is completed and encouraged him to call with any interim questions, concerns, problems or updates.   Thank you very much for allowing me to participate in the care of this nice patient. If I can be of any further assistance to you please do not hesitate to call me at [phone].  Sincerely,  Star Age, MD, PhD

## 2018-04-08 ENCOUNTER — Encounter: Payer: Self-pay | Admitting: Family Medicine

## 2018-04-12 HISTORY — PX: OTHER SURGICAL HISTORY: SHX169

## 2018-04-15 ENCOUNTER — Ambulatory Visit (INDEPENDENT_AMBULATORY_CARE_PROVIDER_SITE_OTHER): Payer: BLUE CROSS/BLUE SHIELD | Admitting: Neurology

## 2018-04-15 DIAGNOSIS — G478 Other sleep disorders: Secondary | ICD-10-CM

## 2018-04-15 DIAGNOSIS — R519 Headache, unspecified: Secondary | ICD-10-CM

## 2018-04-15 DIAGNOSIS — G4719 Other hypersomnia: Secondary | ICD-10-CM

## 2018-04-15 DIAGNOSIS — G4710 Hypersomnia, unspecified: Secondary | ICD-10-CM | POA: Diagnosis not present

## 2018-04-15 DIAGNOSIS — R51 Headache: Secondary | ICD-10-CM

## 2018-04-15 DIAGNOSIS — R0683 Snoring: Secondary | ICD-10-CM

## 2018-04-15 DIAGNOSIS — R351 Nocturia: Secondary | ICD-10-CM

## 2018-04-16 ENCOUNTER — Encounter: Payer: Self-pay | Admitting: Neurology

## 2018-04-27 ENCOUNTER — Telehealth: Payer: Self-pay

## 2018-04-27 NOTE — Telephone Encounter (Signed)
I called pt. I advised pt that Dr. Frances Furbish reviewed pt's sleep study and found that did not show any significant osa. Some snoring was noted. Dr. Frances Furbish recommends that pt avoid sleeping on his back and follow up with Dr. Milinda Cave. I reviewed sleep hygiene recommendations with the pt, including trying to keep a regular sleep wake schedule, avoiding electronics in the bedroom, keeping the bedroom cool, dark, and quiet, and avoiding eating or exercising within 2 hours of bedtime as well as eating in the middle of the night. I advised pt to keep pets out of the bedroom. I discussed with pt the importance of stress relief and to try meditation, deep breathing exercises, and/or a white noise machine or fan to diffuse other noise distractors. I advised pt to not drink alcohol before bedtime and to never mix alcohol and sedating medications. Pt was advised to avoid narcotic pain medication close to bedtime. I advised pt that a copy of these sleep study results will be sent to Dr. Milinda Cave. Pt verbalized understanding of results. Pt had no questions at this time but was encouraged to call back if questions arise.

## 2018-04-27 NOTE — Telephone Encounter (Signed)
-----   Message from Huston Foley, MD sent at 04/27/2018  1:33 PM EDT ----- Patient referred by Dr. Milinda Cave, seen by me on 04/06/18, HST on 04/15/18.   Please call and notify the patient that the recent home sleep test did not show any significant obstructive sleep apnea. Some snoring was noted, for which avoiding the supine sleep position will likely suffice as treatment.  Patient can follow up with the referring provider. Please remind patient to try to maintain good sleep hygiene, which means: Keep a regular sleep and wake schedule and make enough time for sleep (7 1/2 to 8 1/2 hours for the average adult), try not to exercise or have a meal within 2 hours of your bedtime, try to keep your bedroom conducive for sleep, that is, cool and dark, without light distractors such as an illuminated alarm clock, and refrain from watching TV right before sleep or in the middle of the night and do not keep the TV or radio on during the night. If a nightlight is used, have it away from the visual field. Also, try not to use or play on electronic devices at bedtime, such as your cell phone, tablet PC or laptop. If you like to read at bedtime on an electronic device, try to dim the background light as much as possible. Do not eat in the middle of the night. Keep pets away from the bedroom environment. For stress relief, try meditation, deep breathing exercises (there are many books and CDs available), a white noise machine or fan can help to diffuse other noise distractors, such as traffic noise. Do not drink alcohol before bedtime, as it can disturb sleep and cause middle of the night awakenings. Never mix alcohol and sedating medications! Avoid narcotic pain medication close to bedtime, as opioids/narcotics can suppress breathing drive and breathing effort.    Thanks,  Huston Foley, MD, PhD [facility])

## 2018-04-27 NOTE — Procedures (Signed)
[facility] @[facility] [address] [place], [place] [address] NAME: Daniel Stevenson                                                                     DOB:04-Jan-1966 MEDICAL RECORD no: [id number]                                                DOS: 04/15/2018 REFERRING PHYSICIAN: Earley Favor, MD STUDY PERFORMED: HST on [employer] HISTORY: 53 year old man with a history of diabetes, hypothyroidism, migraines, hyperlipidemia, low back pain with status post lumbar spine surgery, and smoking, who reports snoring and excessive daytime somnolence. His Epworth sleepiness score is 6 out of 24, BMI of 23.9.  STUDY RESULTS:   Total Recording Time:  7 hrs, 36 mins; Total Sleep Time: 6 hrs, 13 mins Total Apnea/Hypopnea Index (AHI): 2.4/h; RDI: 4.8/h; REM AHI: 2.1/h Average Oxygen Saturation: 95%; Lowest Oxygen Desaturation: 92%  Total Time Oxygen Saturation Below or at 88%: 0.0 minutes  Average Heart Rate: 72 bpm (between 55 and 102 bpm) IMPRESSION: Snoring RECOMMENDATION: This home sleep test does not demonstrate any significant obstructive or central sleep disordered breathing. Snoring was noted and appeared to be more in the supine position. Positive airway therapy is not warranted. He will be advised to sleep off his back. Other causes of the patient's symptoms, including circadian rhythm disturbances, an underlying mood disorder, medication effect and/or an underlying medical problem cannot be ruled out based on this test. Clinical correlation is recommended. The patient should be cautioned not to drive, work at heights, or operate dangerous or heavy equipment when tired or sleepy. Review and reiteration of good sleep hygiene measures should be pursued with any patient. The patient can follow up with his referring provider, who will be notified of the test results. An appointment in sleep clinic can be made as necessary.   I certify that I have reviewed the raw data recording  prior to the issuance of this report in accordance with the standards of the American Academy of Sleep Medicine (AASM).  Huston Foley, MD, PhD [facility]) Diplomat, ABPN (Neurology and Sleep)

## 2018-04-27 NOTE — Progress Notes (Signed)
Patient referred by Dr. Milinda Cave, seen by me on 04/06/18, HST on 04/15/18.   Please call and notify the patient that the recent home sleep test did not show any significant obstructive sleep apnea. Some snoring was noted, for which avoiding the supine sleep position will likely suffice as treatment.  Patient can follow up with the referring provider. Please remind patient to try to maintain good sleep hygiene, which means: Keep a regular sleep and wake schedule and make enough time for sleep (7 1/2 to 8 1/2 hours for the average adult), try not to exercise or have a meal within 2 hours of your bedtime, try to keep your bedroom conducive for sleep, that is, cool and dark, without light distractors such as an illuminated alarm clock, and refrain from watching TV right before sleep or in the middle of the night and do not keep the TV or radio on during the night. If a nightlight is used, have it away from the visual field. Also, try not to use or play on electronic devices at bedtime, such as your cell phone, tablet PC or laptop. If you like to read at bedtime on an electronic device, try to dim the background light as much as possible. Do not eat in the middle of the night. Keep pets away from the bedroom environment. For stress relief, try meditation, deep breathing exercises (there are many books and CDs available), a white noise machine or fan can help to diffuse other noise distractors, such as traffic noise. Do not drink alcohol before bedtime, as it can disturb sleep and cause middle of the night awakenings. Never mix alcohol and sedating medications! Avoid narcotic pain medication close to bedtime, as opioids/narcotics can suppress breathing drive and breathing effort.    Thanks,  Huston Foley, MD, PhD [facility])

## 2018-05-03 ENCOUNTER — Encounter: Payer: Self-pay | Admitting: Family Medicine

## 2018-05-21 ENCOUNTER — Ambulatory Visit: Payer: BLUE CROSS/BLUE SHIELD | Admitting: Family Medicine

## 2018-05-21 ENCOUNTER — Other Ambulatory Visit: Payer: Self-pay | Admitting: Family Medicine

## 2018-05-21 NOTE — Telephone Encounter (Signed)
Pt had appt today that was cancelled. He needs a [facility] appt scheduled for DM.

## 2018-05-27 NOTE — Telephone Encounter (Signed)
LM for patient to CB to schedule virtual visit. °

## 2018-05-28 NOTE — Telephone Encounter (Signed)
Patient has scheduled VV

## 2018-05-29 ENCOUNTER — Encounter: Payer: Self-pay | Admitting: Family Medicine

## 2018-05-29 ENCOUNTER — Ambulatory Visit (INDEPENDENT_AMBULATORY_CARE_PROVIDER_SITE_OTHER): Payer: BLUE CROSS/BLUE SHIELD | Admitting: Family Medicine

## 2018-05-29 ENCOUNTER — Other Ambulatory Visit: Payer: Self-pay

## 2018-05-29 DIAGNOSIS — R51 Headache: Secondary | ICD-10-CM | POA: Diagnosis not present

## 2018-05-29 DIAGNOSIS — E162 Hypoglycemia, unspecified: Secondary | ICD-10-CM | POA: Diagnosis not present

## 2018-05-29 DIAGNOSIS — E119 Type 2 diabetes mellitus without complications: Secondary | ICD-10-CM

## 2018-05-29 DIAGNOSIS — R519 Headache, unspecified: Secondary | ICD-10-CM

## 2018-05-29 MED ORDER — INSULIN ASPART 100 UNIT/ML FLEXPEN: PEN_INJECTOR | SUBCUTANEOUS | 1 refills | Status: DC

## 2018-05-29 NOTE — Progress Notes (Signed)
Virtual Visit via Video Note  I connected with pt  on 05/29/18 at  1:00 PM EDT by a video enabled telemedicine application and verified that I am speaking with the correct person using two identifiers.  Location patient: home Location provider:work or home office Persons participating in the virtual visit: patient, provider  I discussed the limitations of evaluation and management by telemedicine and the availability of in person appointments. The patient expressed understanding and agreed to proceed.    HPI: 53 y/o WM being seen for 3 mo f/u DM 2.  He was seeing Dr. Morrison Old for his DM up until relatively recently when he preferred to switch back to me due to high copay to see specialist.  DM 2: Last visit his hba1c was 8.1%, increased from 7.4% 3 months prior. I recommended he increase lantus to 15 units. Continue a base of 5 units of novolog at mealtime and add an extra unit of novolog with a meal if the pre-meal glucose is 150 or greater.  Additionally, he has chronic hyponatremia believed to be from ADH-like effect that is possible from his amitriptyline. Na level 125-129 range consistently from mid 2017 to the last check 3 mo ago.  DM: avg gluc the last 7d= 150. Last 90d avg 153. Focusing on diet more. Taking lantus 15 U qhs, taking novolog 6 U qAC. Still having low gluc : down to 40-50 range around 4 AM.  Happens about 3 times a wake. His sensor alerts his phone and this wakes him up. No dietary or activity level changes on days prior. Still waking up with HAs. He did get a sleep study and it was NEG for OSA.  ROS: See pertinent positives and negatives per HPI.  Past Medical History:  Diagnosis Date  . Back pain    s/p back surgery  . Chronic hyponatremia 08/2012 onset   Onset was right after starting amitriptyline for his neuropathic leg pain--has been stable.  Likely ADH-like effect on kidney from amitriptyline.  Marland Kitchen History of low back pain 06/24/2011  . Hyperlipidemia    . Hypothyroidism   . LADA (latent autoimmune diabetes in adults), managed as type 1 (HCC) Dx'd 2014   Dr. Morrison Old.  . Migraines   . Neuropathic pain of right lower extremity   . PLANTAR WART, RIGHT 10/09/2009  . Snoring    Sleep study neg of OSA 04/2018  . Tobacco dependence    quit 2012; restarted not long after    Past Surgical History:  Procedure Laterality Date  . BACK SURGERY  2009   lower lumbar fusion- rubber disc in back  . CARDIOVASCULAR STRESS TEST  03/21/14   No ischemia/low risk study; however, LV function showed EF 49% and global LV hypokinesis--echo recommended and it showed normal LV fxn.  Marland Kitchen NASAL SINUS SURGERY  2007  . TRANSTHORACIC ECHOCARDIOGRAM  03/24/14   Normal LV fxn, EF 50-55%, no wall motion abnormalities: (grade 1 diast dysfxn)    Family History  Problem Relation Age of Onset  . Emphysema Mother        smoker  . Obesity Father      Current Outpatient Medications:  .  amitriptyline (ELAVIL) 25 MG tablet, TAKE 1 TABLET BY MOUTH IN THE MORNING AND 3 TABS AT BEDTIME FOR RIGHT LEG PAIN., Disp: 120 tablet, Rfl: 11 .  atorvastatin (LIPITOR) 20 MG tablet, Take 1 tablet (20 mg total) by mouth daily., Disp: 90 tablet, Rfl: 1 .  carbamide peroxide (DEBROX) 6.5 % OTIC  solution, Use twice a day in the left ear, until cleaned. The can use 1-2 drops every 2 weeks each ear to maintain., Disp: 15 mL, Rfl: 1 .  Continuous Blood Gluc Sensor (FREESTYLE LIBRE SENSOR SYSTEM) MISC, by Does not apply route., Disp: , Rfl:  .  Continuous Blood Gluc Sensor (FREESTYLE LIBRE SENSOR SYSTEM) MISC, USE 1 EACH BY DOES NOT APPLY ROUTE EVERY 10 DAYS., Disp: , Rfl: 99 .  fluticasone (FLONASE) 50 MCG/ACT nasal spray, SPRAY 2 SPRAYS INTO EACH NOSTRIL EVERY DAY, Disp: 16 g, Rfl: 1 .  Insulin Glargine (LANTUS SOLOSTAR) 100 UNIT/ML Solostar Pen, Inject 15 Units into the skin at bedtime. , Disp: , Rfl:  .  Insulin Pen Needle 32G X 4 MM MISC, (BD ULTRA-FINE NEEDLES) USE 4 TIMES DAILY AS NEEDED.  DX:E11.65, Disp: 100 each, Rfl: 11 .  NOVOLOG FLEXPEN 100 UNIT/ML FlexPen, INJECT 5 UNITS SUBCUTANEOUS BEFORE MEALS, Disp: 15 mL, Rfl: 1 .  SYNTHROID 137 MCG tablet, Take 1 tablet every day expect for Mondays, Wednesdays and Fridays take 1.5 tablets (Patient taking differently: Take 175 mcg by mouth daily before breakfast. ), Disp: 35 tablet, Rfl: 2  EXAM:  VITALS per patient if applicable: There were no vitals taken for this visit.   GENERAL: alert, oriented, appears well and in no acute distress  HEENT: atraumatic, conjunttiva clear, no obvious abnormalities on inspection of external nose and ears  NECK: normal movements of the head and neck  LUNGS: on inspection no signs of respiratory distress, breathing rate appears normal, no obvious gross SOB, gasping or wheezing  CV: no obvious cyanosis  MS: moves all visible extremities without noticeable abnormality  PSYCH/NEURO: pleasant and cooperative, no obvious depression or anxiety, speech and thought processing grossly intact  LABS: none today  Lab Results  Component Value Date   TSH 5.32 (H) 02/19/2018  Free T4= 1.02 02/19/18 (normal).     Chemistry      Component Value Date/Time   NA 128 (L) 02/19/2018 1636   K 4.2 02/19/2018 1636   CL 94 (L) 02/19/2018 1636   CO2 27 02/19/2018 1636   BUN 18 02/19/2018 1636   CREATININE 0.98 02/19/2018 1636   CREATININE 0.71 06/27/2011 1516      Component Value Date/Time   CALCIUM 9.2 02/19/2018 1636   ALKPHOS 119 (H) 11/18/2017 0849   AST 17 11/18/2017 0849   ALT 14 11/18/2017 0849   BILITOT 0.7 11/18/2017 0849     Lab Results  Component Value Date   HGBA1C 8.1 (H) 02/19/2018   Lab Results  Component Value Date   CHOL 142 11/18/2017   HDL 61.90 11/18/2017   LDLCALC 65 11/18/2017   TRIG 75.0 11/18/2017   CHOLHDL 2 11/18/2017    ASSESSMENT AND PLAN:  Discussed the following assessment and plan:  1) DM 2, control improved over the last 3 mo. Would expect A1c to be  around 7% according to the readings he's been getting [place] his [facility]. We'll hold off on A1c repeat this time, plan repeat A1c in 3 mo. Will get urine microalb/cr at that time as well.  2)  Hypoglycemia: consistently occurring in middle of the night 2-3 days a week. Question of mild peak in Lantus at that point in time, ? Variable lantus absorption. Change timing of lantus dosing to morning time (keep dose at 15 U).  Continue 6 U novolog qAC.  3) Morning HA's: he had a normal sleep study. Suspect this may be related to low  glucose in hours of sleep. See #2.  I discussed the assessment and treatment plan with the patient. The patient was provided an opportunity to ask questions and all were answered. The patient agreed with the plan and demonstrated an understanding of the instructions.   The patient was advised to call back or seek an in-person evaluation if the symptoms worsen or if the condition fails to improve as anticipated.  F/u: 3 mo RCI  Signed:  Santiago Bumpers, MD           05/29/2018

## 2018-09-15 ENCOUNTER — Ambulatory Visit: Payer: Self-pay

## 2018-09-15 ENCOUNTER — Emergency Department (HOSPITAL_BASED_OUTPATIENT_CLINIC_OR_DEPARTMENT_OTHER)
Admission: EM | Admit: 2018-09-15 | Discharge: 2018-09-15 | Disposition: A | Discharge status: Home / Self Care | Payer: BC Managed Care – PPO | Attending: Emergency Medicine | Admitting: Emergency Medicine

## 2018-09-15 ENCOUNTER — Encounter (HOSPITAL_BASED_OUTPATIENT_CLINIC_OR_DEPARTMENT_OTHER): Payer: Self-pay

## 2018-09-15 ENCOUNTER — Emergency Department (HOSPITAL_BASED_OUTPATIENT_CLINIC_OR_DEPARTMENT_OTHER): Payer: BC Managed Care – PPO

## 2018-09-15 ENCOUNTER — Other Ambulatory Visit: Payer: Self-pay

## 2018-09-15 DIAGNOSIS — Z79899 Other long term (current) drug therapy: Secondary | ICD-10-CM | POA: Insufficient documentation

## 2018-09-15 DIAGNOSIS — R079 Chest pain, unspecified: Secondary | ICD-10-CM | POA: Diagnosis not present

## 2018-09-15 DIAGNOSIS — R0789 Other chest pain: Secondary | ICD-10-CM

## 2018-09-15 DIAGNOSIS — R0602 Shortness of breath: Secondary | ICD-10-CM | POA: Diagnosis not present

## 2018-09-15 DIAGNOSIS — Z794 Long term (current) use of insulin: Secondary | ICD-10-CM | POA: Diagnosis not present

## 2018-09-15 DIAGNOSIS — F17210 Nicotine dependence, cigarettes, uncomplicated: Secondary | ICD-10-CM | POA: Insufficient documentation

## 2018-09-15 DIAGNOSIS — E039 Hypothyroidism, unspecified: Secondary | ICD-10-CM | POA: Diagnosis not present

## 2018-09-15 DIAGNOSIS — E119 Type 2 diabetes mellitus without complications: Secondary | ICD-10-CM | POA: Diagnosis not present

## 2018-09-15 LAB — TROPONIN I (HIGH SENSITIVITY): Troponin I (High Sensitivity): 3 ng/L (ref <18)

## 2018-09-15 LAB — CBC
HCT: 39.8 % (ref 39.0–52.0)
Hemoglobin: 13.9 g/dL (ref 13.0–17.0)
MCH: 30.8 pg (ref 26.0–34.0)
MCHC: 34.9 g/dL (ref 30.0–36.0)
MCV: 88.2 fL (ref 80.0–100.0)
Platelets: 280 10*3/uL (ref 150–400)
RBC: 4.51 MIL/uL (ref 4.22–5.81)
RDW: 12.7 % (ref 11.5–15.5)
WBC: 9.8 10*3/uL (ref 4.0–10.5)
nRBC: 0 % (ref 0.0–0.2)

## 2018-09-15 LAB — BASIC METABOLIC PANEL
Anion gap: 10 (ref 5–15)
BUN: 11 mg/dL (ref 6–20)
CO2: 24 mmol/L (ref 22–32)
Calcium: 8.8 mg/dL — ABNORMAL LOW (ref 8.9–10.3)
Chloride: 89 mmol/L — ABNORMAL LOW (ref 98–111)
Creatinine, Ser: 0.87 mg/dL (ref 0.61–1.24)
GFR calc Af Amer: >60 mL/min (ref >60)
GFR calc non Af Amer: >60 mL/min (ref >60)
Glucose, Bld: 231 mg/dL — ABNORMAL HIGH (ref 70–99)
Glucose: 231
Potassium: 3.9 mmol/L (ref 3.5–5.1)
Sodium: 123 mmol/L — ABNORMAL LOW (ref 135–145)
Sodium: 123 — AB (ref 137–147)

## 2018-09-15 MED ORDER — SODIUM CHLORIDE 0.9% FLUSH
3.0000 mL | Freq: Once | INTRAVENOUS | Status: DC
  Filled 2018-09-15: qty 3

## 2018-09-15 MED ORDER — DICLOFENAC SODIUM 1 % TD GEL: 4.0000 g | Freq: Four times a day (QID) | TRANSDERMAL | 0 refills | Status: DC

## 2018-09-15 NOTE — Telephone Encounter (Signed)
Noted  

## 2018-09-15 NOTE — ED Provider Notes (Signed)
[facility] EMERGENCY DEPARTMENT Provider Note   CSN: [id number] Arrival date & time: 09/15/18  1252    History   Chief Complaint Chief Complaint  Patient presents with  . Chest Pain    HPI Daniel MaskerRobert M Stevenson is a 53 y.o. male.     53 yo M with a chief complaints of chest pain.  This been going on for about 2 weeks now.  Describes as a pressure.  Sometimes radiates up to his right neck.  Usually this is worse with stretching movement palpation or deep breathing.  He denies any trauma to the chest wall.  Denies cough congestion or fever.  Denies hemoptysis unilateral lower extremity edema history of PE or DVT history of cancer recent surgery or hospitalization or immobilization.  Patient denies history of MI denies history of hypertension hyperlipidemia.  He is a type I diabetic every day smoker mom had a heart attack in her early 3360s.  The history is provided by the patient and the spouse.  Chest Pain Pain location:  R chest and L chest Pain quality: aching, pressure and sharp   Pain radiates to:  Does not radiate Pain severity:  Moderate Onset quality:  Gradual Duration:  2 weeks Timing:  Constant Progression:  Worsening Chronicity:  New Relieved by:  Nothing Worsened by:  Deep breathing and movement Ineffective treatments:  None tried Associated symptoms: shortness of breath   Associated symptoms: no abdominal pain, no fever, no headache, no palpitations and no vomiting     Past Medical History:  Diagnosis Date  . Back pain    s/p back surgery  . Chronic hyponatremia 08/2012 onset   Onset was right after starting amitriptyline for his neuropathic leg pain--has been stable.  Likely ADH-like effect on kidney from amitriptyline.  Marland Kitchen. History of low back pain 06/24/2011  . Hyperlipidemia   . Hypothyroidism   . LADA (latent autoimmune diabetes in adults), managed as type 1 (HCC) Dx'd 2014   Dr. Morrison OldLambeth.  . Migraines   . Neuropathic pain of right lower extremity    . PLANTAR WART, RIGHT 10/09/2009  . Snoring    Sleep study neg of OSA 04/2018  . Tobacco dependence    quit 2012; restarted not long after    Patient Active Problem List   Diagnosis Date Noted  . Lymphadenitis, acute 05/02/2015  . Headache 01/10/2015  . Fatigue 01/10/2015  . Allergic rhinitis 01/10/2015  . Medication intolerance 09/06/2013  . Health maintenance examination 07/13/2013  . Type II or unspecified type diabetes mellitus without mention of complication, uncontrolled 01/18/2013  . GERD (gastroesophageal reflux disease) 01/18/2013  . Right leg pain 08/18/2012  . Ganglion cyst of flexor tendon sheath 08/05/2011  . History of low back pain 06/24/2011  . Tobacco dependence 11/20/2010  . Hypothyroidism 10/09/2009    Past Surgical History:  Procedure Laterality Date  . BACK SURGERY  2009   lower lumbar fusion- rubber disc in back  . CARDIOVASCULAR STRESS TEST  03/21/14   No ischemia/low risk study; however, LV function showed EF 49% and global LV hypokinesis--echo recommended and it showed normal LV fxn.  Marland Kitchen. NASAL SINUS SURGERY  2007  . TRANSTHORACIC ECHOCARDIOGRAM  03/24/14   Normal LV fxn, EF 50-55%, no wall motion abnormalities: (grade 1 diast dysfxn)        Home Medications    Prior to Admission medications   Medication Sig Start Date End Date Taking? Authorizing Provider  amitriptyline (ELAVIL) 25 MG tablet TAKE 1  TABLET BY MOUTH IN THE MORNING AND 3 TABS AT BEDTIME FOR RIGHT LEG PAIN. 01/02/18  Yes McGowen, Maryjean MornPhilip H, MD  insulin aspart (NOVOLOG FLEXPEN) 100 UNIT/ML FlexPen 6 units qAC 05/29/18  Yes McGowen, Maryjean MornPhilip H, MD  Insulin Glargine (LANTUS SOLOSTAR) 100 UNIT/ML Solostar Pen Inject 15 Units into the skin at bedtime.  05/02/17  Yes [provider]  SYNTHROID 137 MCG tablet Take 1 tablet every day expect for Mondays, Wednesdays and Fridays take 1.5 tablets Patient taking differently: Take 175 mcg by mouth daily before breakfast.  04/28/17  Yes McGowen,  Maryjean MornPhilip H, MD  Continuous Blood Gluc Sensor (FREESTYLE LIBRE SENSOR SYSTEM) MISC by Does not apply route. 08/15/16   [provider]  Continuous Blood Gluc Sensor (FREESTYLE LIBRE SENSOR SYSTEM) MISC USE 1 EACH BY DOES NOT APPLY ROUTE EVERY 10 DAYS. 11/04/16   [provider]  diclofenac sodium (VOLTAREN) 1 % GEL Apply 4 g topically 4 (four) times daily. 09/15/18   Melene PlanFloyd, Stephane Junkins, DO  Insulin Pen Needle 32G X 4 MM MISC (BD ULTRA-FINE NEEDLES) USE 4 TIMES DAILY AS NEEDED. DX:E11.65 03/09/18   McGowen, Maryjean MornPhilip H, MD    Family History Family History  Problem Relation Age of Onset  . Emphysema Mother        smoker  . Obesity Father     Social History Social History   Tobacco Use  . Smoking status: Current Every Day Smoker    Packs/day: 1.00    Years: 35.00    Pack years: 35.00    Types: Cigarettes    Last attempt to quit: 12/19/2010    Years since quitting: 7.7  . Smokeless tobacco: Never Used  Substance Use Topics  . Alcohol use: Yes    Comment: rarely  . Drug use: No     Allergies   Augmentin [amoxicillin-pot clavulanate], Oxycodone-acetaminophen, and Levothyroxine   Review of Systems Review of Systems  Constitutional: Negative for chills and fever.  HENT: Negative for congestion and facial swelling.   Eyes: Negative for discharge and visual disturbance.  Respiratory: Positive for shortness of breath.   Cardiovascular: Positive for chest pain. Negative for palpitations.  Gastrointestinal: Negative for abdominal pain, diarrhea and vomiting.  Musculoskeletal: Negative for arthralgias and myalgias.  Skin: Negative for color change and rash.  Neurological: Negative for tremors, syncope and headaches.  Psychiatric/Behavioral: Negative for confusion and dysphoric mood.     Physical Exam Updated Vital Signs BP (!) 141/94 (BP Location: Right Arm)   Pulse 89   Temp 98.2 F (36.8 C) (Oral)   Resp 16   Ht 5\' 5"  (1.651 m)   Wt 68.9 kg   BMI 25.29 kg/m    Physical Exam Vitals signs and nursing note reviewed.  Constitutional:      Appearance: He is well-developed.  HENT:     Head: Normocephalic and atraumatic.  Eyes:     Pupils: Pupils are equal, round, and reactive to light.  Neck:     Musculoskeletal: Normal range of motion and neck supple.     Vascular: No JVD.  Cardiovascular:     Rate and Rhythm: Normal rate and regular rhythm.     Heart sounds: No murmur. No friction rub. No gallop.   Pulmonary:     Effort: No respiratory distress.     Breath sounds: No wheezing.  Chest:     Chest wall: Tenderness present.     Comments: Palpation of the chest wall reproduces the patient's pain. Abdominal:  General: There is no distension.     Tenderness: There is no guarding or rebound.  Musculoskeletal: Normal range of motion.  Skin:    Coloration: Skin is not pale.     Findings: No rash.  Neurological:     Mental Status: He is alert and oriented to person, place, and time.  Psychiatric:        Behavior: Behavior normal.      ED Treatments / Results  Labs (all labs ordered are listed, but only abnormal results are displayed) Labs Reviewed  BASIC METABOLIC PANEL - Abnormal; Notable for the following components:      Result Value   Sodium 123 (*)    Chloride 89 (*)    Glucose, Bld 231 (*)    Calcium 8.8 (*)    All other components within normal limits  CBC  TROPONIN I (HIGH SENSITIVITY)    EKG EKG Interpretation  Date/Time:  Tuesday September 15 2018 13:03:25 EDT Ventricular Rate:  90 PR Interval:    QRS Duration: 86 QT Interval:  353 QTC Calculation: 432 R Axis:   77 Text Interpretation:  Sinus rhythm Baseline wander in lead(s) V2 No significant change since last tracing Confirmed by Deno Etienne [phone]) on 09/15/2018 1:15:03 PM   Radiology Dg Chest 2 View  Result Date: 09/15/2018 CLINICAL DATA:  Shortness of breath. EXAM: CHEST - 2 VIEW COMPARISON:  03/20/2017. FINDINGS: Mediastinum hilar structures normal. Lungs are  clear. No pleural effusion or pneumothorax. Heart size normal. No acute bony abnormality. Degenerative change thoracic spine. IMPRESSION: Disease. Electronically Signed   By: Marcello Moores  Register   On: 09/15/2018 13:22    Procedures Procedures (including critical care time)  Medications Ordered in ED Medications  sodium chloride flush (NS) 0.9 % injection 3 mL (has no administration in time range)     Initial Impression / Assessment and Plan / ED Course  I have reviewed the triage vital signs and the nursing notes.  Pertinent labs & imaging results that were available during my care of the patient were reviewed by me and considered in my medical decision making (see chart for details).        53 yo M with a chief complaints of chest pain.  This is atypical in nature and reproduced on exam.  Going on for at least the past 2 weeks.  His troponin is negative.  I feel no reason for repeat.  EKG is unremarkable chest x-ray reviewed by me without focal infiltrate or pneumothorax.  Will discharge patient home.  PCP follow-up.  1:56 PM:  I have discussed the diagnosis/risks/treatment options with the patient and family and believe the pt to be eligible for discharge home to follow-up with PCP. We also discussed returning to the ED immediately if new or worsening sx occur. We discussed the sx which are most concerning (e.g., sudden worsening pain, fever, inability to tolerate by mouth) that necessitate immediate return. Medications administered to the patient during their visit and any new prescriptions provided to the patient are listed below.  Medications given during this visit Medications  sodium chloride flush (NS) 0.9 % injection 3 mL (has no administration in time range)     The patient appears reasonably screen and/or stabilized for discharge and I doubt any other medical condition or other [facility] requiring further screening, evaluation, or treatment in the ED at this time prior to discharge.     Final Clinical Impressions(s) / ED Diagnoses   Final diagnoses:  Atypical chest pain  ED Discharge Orders         Ordered    diclofenac sodium (VOLTAREN) 1 % GEL  4 times daily     09/15/18 1350           Big CreekFloyd, Rhianne Soman, DO 09/15/18 1356

## 2018-09-15 NOTE — Telephone Encounter (Signed)
Patient called and says he's been having off and on chest pain and tightness more when he's lying down and resting. He says today since he woke up, the chest tightness has been constant and it's affecting him at work today. He says he's tired and having trouble catching his breath. He says the tightness doesn't get worse with exertion, but it's worse at rest. He says when he's lying down the pain goes up to his neck. He says the pain level is 4-5, but it's getting worse. He says the pain is the center of his chest when it's there, but no pain now, just tightness. He denies fever, says he has a slight cough. I advised to go to the ED for evaluation, he verbalized understanding and says he will go.  Reason for Disposition . [1] Chest pain (or "angina") comes and goes AND [2] is happening more often (increasing in frequency) or getting worse (increasing in severity)  Answer Assessment - Initial Assessment Questions 1. LOCATION: "Where does it hurt?"       Center of chest pain at night 2. RADIATION: "Does the pain go anywhere else?" (e.g., into neck, jaw, arms, back)     Sometimes up on the right side of neck 3. ONSET: "When did the chest pain begin?" (Minutes, hours or days)      A couple of months ago, but getting progressively worse 4. PATTERN "Does the pain come and go, or has it been constant since it started?"  "Does it get worse with exertion?"      Comes and goes, not going away today.  Feels worse when relaxing, more noticeable and when lay down to go to sleep. 5. DURATION: "How long does it last" (e.g., seconds, minutes, hours)     Been there all day today 6. SEVERITY: "How bad is the pain?"  (e.g., Scale 1-10; mild, moderate, or severe)    - MILD (1-3): doesn't interfere with normal activities     - MODERATE (4-7): interferes with normal activities or awakens from sleep    - SEVERE (8-10): excruciating pain, unable to do any normal activities       Moderate 7. CARDIAC RISK FACTORS: "Do  you have any history of heart problems or risk factors for heart disease?" (e.g., angina, prior heart attack; diabetes, high blood pressure, high cholesterol, smoker, or strong family history of heart disease)     Type 1 Diabetic, smoker, strong family history of heart disease 8. PULMONARY RISK FACTORS: "Do you have any history of lung disease?"  (e.g., blood clots in lung, asthma, emphysema, birth control pills)     No 9. CAUSE: "What do you think is causing the chest pain?"     I don't know 10. OTHER SYMPTOMS: "Do you have any other symptoms?" (e.g., dizziness, nausea, vomiting, sweating, fever, difficulty breathing, cough)      Feeling tired, difficulty breathing, slight cough 11. PREGNANCY: "Is there any chance you are pregnant?" "When was your last menstrual period?"      N/A  Protocols used: CHEST PAIN-A-AH

## 2018-09-15 NOTE — ED Triage Notes (Signed)
Pt c/o CP x 2 months-SOB x 2 days-NAD-steady gait

## 2018-09-15 NOTE — Discharge Instructions (Signed)
Follow up with your family doc.  Return for worsening symptoms.  Try and quit smoking.  There are resources on this paperwork to help if you need it.

## 2018-09-16 ENCOUNTER — Encounter: Payer: Self-pay | Admitting: Family Medicine

## 2018-09-17 ENCOUNTER — Ambulatory Visit: Payer: BC Managed Care – PPO | Admitting: Family Medicine

## 2018-09-17 ENCOUNTER — Other Ambulatory Visit: Payer: Self-pay

## 2018-09-17 ENCOUNTER — Encounter: Payer: Self-pay | Admitting: Family Medicine

## 2018-09-17 VITALS — BP 139/86 | HR 90 | Temp 98.0°F | Resp 16 | Ht 65.0 in | Wt 153.0 lb

## 2018-09-17 DIAGNOSIS — R0789 Other chest pain: Secondary | ICD-10-CM | POA: Diagnosis not present

## 2018-09-17 DIAGNOSIS — E139 Other specified diabetes mellitus without complications: Secondary | ICD-10-CM | POA: Diagnosis not present

## 2018-09-17 DIAGNOSIS — E039 Hypothyroidism, unspecified: Secondary | ICD-10-CM

## 2018-09-17 DIAGNOSIS — R5383 Other fatigue: Secondary | ICD-10-CM | POA: Diagnosis not present

## 2018-09-17 DIAGNOSIS — E871 Hypo-osmolality and hyponatremia: Secondary | ICD-10-CM

## 2018-09-17 MED ORDER — ATORVASTATIN CALCIUM 20 MG PO TABS: 20.0000 mg | ORAL_TABLET | Freq: Every day | ORAL | 0 refills | Status: DC

## 2018-09-17 MED ORDER — FLUTICASONE PROPIONATE 50 MCG/ACT NA SUSP: 2.0000 | Freq: Every day | NASAL | 6 refills | Status: DC

## 2018-09-17 NOTE — Progress Notes (Signed)
OFFICE VISIT  09/17/2018   CC:  Chief Complaint  Patient presents with  . Hospitalization Follow-up    chest pain     HPI:    Patient is a 53 y.o. Caucasian male who presents for f/u ED visit for chest pain.  I reviewed entire ED records today. Went to [facility] ED 09/15/18 for c/o CP that had been occurring for 2 weeks: chest pressure, rad to R side of neck.  His CP was reproducible on exam per EDP documentation.  EKG unremarkable, CXR normal.  Troponin I x 1 normal.  Glucose 231, Na 123 (has chronic hyponatremia).  Otherwise remainder of met panel normal as was CBC.  Diclofenac gel rx'd at d/c and he is applying it to his chest and he feels like it helps some. CV RF's: DM, current smoker.  Interim hx: Doing a little better. Feels some sharp pain in central chest and radiates up into R neck region.  Feels it mostly in afternoon or evening.  Worse with turning torso and with lying in certain positions. No palpitations, no nausea, no palpitations.  No left arm pain or jaw pain. Has felt fatigued for the last 2 weeks.  No recent sx's of viral illness. Gets 6 hours of sleep per night, no different than his usual. Drinks a lot of water during the day. Glucose control has been good.  Having LESS hypoglycemia with taking his lantus in morning.  Lots less morning HA.  Says 90 d avg is 148. Working hard, also busy at home. Gets GER sx's and takes tums sometimes which helps.  Not on H2B or PPI.  ROS:  no SOB, no wheezing, minimal chronic "smoker's cough" w/out hemoptysis, no dizziness, no HAs, no rashes, no melena/hematochezia.  No myalgias or arthralgias.  Past Medical History:  Diagnosis Date  . Back pain    s/p back surgery  . Chronic hyponatremia 08/2012 onset   Onset was right after starting amitriptyline for his neuropathic leg pain--has been stable.  Likely ADH-like effect on kidney from amitriptyline.  Marland Kitchen History of low back pain 06/24/2011  . Hyperlipidemia   . Hypothyroidism   .  LADA (latent autoimmune diabetes in adults), managed as type 1 ([place]) Dx'd 2014   Dr. Steffanie Dunn.  . Migraines   . Neuropathic pain of right lower extremity   . PLANTAR WART, RIGHT 10/09/2009  . Snoring    Sleep study neg of OSA 04/2018  . Tobacco dependence    quit 2012; restarted not long after    Past Surgical History:  Procedure Laterality Date  . BACK SURGERY  2009   lower lumbar fusion- rubber disc in back  . CARDIOVASCULAR STRESS TEST  03/21/14   No ischemia/low risk study; however, LV function showed EF 49% and global LV hypokinesis--echo recommended and it showed normal LV fxn.  Marland Kitchen NASAL SINUS SURGERY  2007  . TRANSTHORACIC ECHOCARDIOGRAM  03/24/14   Normal LV fxn, EF 50-55%, no wall motion abnormalities: (grade 1 diast dysfxn)    Outpatient Medications Prior to Visit  Medication Sig Dispense Refill  . amitriptyline (ELAVIL) 25 MG tablet TAKE 1 TABLET BY MOUTH IN THE MORNING AND 3 TABS AT BEDTIME FOR RIGHT LEG PAIN. 120 tablet 11  . Continuous Blood Gluc Sensor ([place]) MISC by Does not apply route.    . Continuous Blood Gluc Sensor (FREESTYLE LIBRE SENSOR SYSTEM) MISC USE 1 EACH BY DOES NOT APPLY ROUTE EVERY 10 DAYS.  99  .  diclofenac sodium (VOLTAREN) 1 % GEL Apply 4 g topically 4 (four) times daily. 100 g 0  . insulin aspart (NOVOLOG FLEXPEN) 100 UNIT/ML FlexPen 6 units qAC 15 mL 1  . Insulin Glargine (LANTUS SOLOSTAR) 100 UNIT/ML Solostar Pen Inject 15 Units into the skin at bedtime.     . Insulin Pen Needle 32G X 4 MM MISC (BD ULTRA-FINE NEEDLES) USE 4 TIMES DAILY AS NEEDED. DX:E11.65 100 each 80  . SYNTHROID 175 MCG tablet     . SYNTHROID 137 MCG tablet Take 1 tablet every day expect for Mondays, Wednesdays and Fridays take 1.5 tablets (Patient taking differently: Take 175 mcg by mouth daily before breakfast. ) 35 tablet 2   No facility-administered medications prior to visit.     Allergies  Allergen Reactions  . Augmentin [Amoxicillin-Pot  Clavulanate] Nausea And Vomiting  . Oxycodone-Acetaminophen     REACTION: itching  . Levothyroxine Rash    Had rash associated with generic levothyroxine, but not brand-name Synthroid.  Likely allergic to a dye or filler in the generic preparation.    ROS As per HPI  PE:  VITALS: Blood pressure 139/86, pulse 90, temperature 98 F (36.7 C), temperature source Temporal, resp. rate 16, height 5' 5" (1.651 m), weight 153 lb (69.4 kg), SpO2 97 %. Gen: alert, well appearing. HEENT: eyes without swelling, eryth, or drainage.  Throat: no swelling, erythema, or exudate. Neck: no adenopathy, thyromegaly, or tenderness.  Carotids 2+ bilat, no bruit. Lungs: CTA bilat, nonlabored resps.  CV: RRR, no m/r/g Abd: soft, NT. EXT: no edema  LABS:  Lab Results  Component Value Date   TSH 5.32 (H) 02/19/2018   Lab Results  Component Value Date   WBC 9.8 09/15/2018   HGB 13.9 09/15/2018   HCT 39.8 09/15/2018   MCV 88.2 09/15/2018   PLT 280 09/15/2018   Lab Results  Component Value Date   CREATININE 0.87 09/15/2018   BUN 11 09/15/2018   NA 123 (L) 09/15/2018   K 3.9 09/15/2018   CL 89 (L) 09/15/2018   CO2 24 09/15/2018   Lab Results  Component Value Date   ALT 14 11/18/2017   AST 17 11/18/2017   ALKPHOS 119 (H) 11/18/2017   BILITOT 0.7 11/18/2017   Lab Results  Component Value Date   CHOL 142 11/18/2017   Lab Results  Component Value Date   HDL 61.90 11/18/2017   Lab Results  Component Value Date   LDLCALC 65 11/18/2017   Lab Results  Component Value Date   TRIG 75.0 11/18/2017   Lab Results  Component Value Date   CHOLHDL 2 11/18/2017   Lab Results  Component Value Date   PSA 0.23 11/18/2017   PSA 0.26 11/14/2016   Lab Results  Component Value Date   HGBA1C 8.1 (H) 02/19/2018    IMPRESSION AND PLAN:  1) Atypical CP: suspect chest wall pain. May continue voltaren gel qid prn.  2) DM/LADA: control sounds good per home glucose monitoring. Hypoglycemic  episodes much better since changing timing of lantus to morning.   Morning HAs also much improved. A1c and urine microalb/cr today.  3) Hypothyroidism: this has been stable, but with recent fatigue will go ahead and check TSH today.  4) Chronic hyponatremia: I don't think this is the cause of any of his recent sx's. We have worked this up in the past and I think this is an SIADH-like effect of amitriptyline.  5) Fatigue x 2 wks: suspect this is related  to working hard/extra + heat lately.  An After Visit Summary was printed and given to the patient.  FOLLOW UP: Return in about 3 months (around 12/18/2018) for annual CPE (fasting).  Signed:  Crissie Sickles, MD           09/17/2018

## 2018-09-18 LAB — TSH: TSH: 1.04 u[IU]/mL (ref 0.35–4.50)

## 2018-09-18 LAB — HEMOGLOBIN A1C: Hgb A1c MFr Bld: 7.6 % — ABNORMAL HIGH (ref 4.6–6.5)

## 2018-09-21 LAB — MICROALBUMIN / CREATININE URINE RATIO
Creatinine,U: 89.4 mg/dL
Microalb Creat Ratio: 6.5 mg/g (ref 0.0–30.0)
Microalb, Ur: 5.9 mg/dL — ABNORMAL HIGH (ref 0.0–1.9)

## 2018-10-13 ENCOUNTER — Other Ambulatory Visit: Payer: Self-pay | Admitting: Family Medicine

## 2018-11-19 DIAGNOSIS — E139 Other specified diabetes mellitus without complications: Secondary | ICD-10-CM | POA: Diagnosis not present

## 2018-11-19 DIAGNOSIS — Z23 Encounter for immunization: Secondary | ICD-10-CM | POA: Diagnosis not present

## 2018-11-24 DIAGNOSIS — E139 Other specified diabetes mellitus without complications: Secondary | ICD-10-CM | POA: Diagnosis not present

## 2018-11-24 DIAGNOSIS — E559 Vitamin D deficiency, unspecified: Secondary | ICD-10-CM | POA: Diagnosis not present

## 2018-11-24 DIAGNOSIS — E039 Hypothyroidism, unspecified: Secondary | ICD-10-CM | POA: Diagnosis not present

## 2018-12-07 ENCOUNTER — Other Ambulatory Visit: Payer: Self-pay | Admitting: Family Medicine

## 2018-12-24 ENCOUNTER — Encounter: Payer: Self-pay | Admitting: Family Medicine

## 2018-12-24 ENCOUNTER — Other Ambulatory Visit: Payer: Self-pay

## 2018-12-24 ENCOUNTER — Ambulatory Visit (INDEPENDENT_AMBULATORY_CARE_PROVIDER_SITE_OTHER): Payer: BC Managed Care – PPO | Admitting: Family Medicine

## 2018-12-24 VITALS — BP 133/90 | HR 105 | Temp 98.7°F | Resp 16 | Ht 65.0 in | Wt 149.4 lb

## 2018-12-24 DIAGNOSIS — M159 Polyosteoarthritis, unspecified: Secondary | ICD-10-CM

## 2018-12-24 DIAGNOSIS — E039 Hypothyroidism, unspecified: Secondary | ICD-10-CM | POA: Diagnosis not present

## 2018-12-24 DIAGNOSIS — E119 Type 2 diabetes mellitus without complications: Secondary | ICD-10-CM | POA: Diagnosis not present

## 2018-12-24 DIAGNOSIS — M8949 Other hypertrophic osteoarthropathy, multiple sites: Secondary | ICD-10-CM | POA: Diagnosis not present

## 2018-12-24 DIAGNOSIS — Z23 Encounter for immunization: Secondary | ICD-10-CM

## 2018-12-24 DIAGNOSIS — Z0000 Encounter for general adult medical examination without abnormal findings: Secondary | ICD-10-CM

## 2018-12-24 DIAGNOSIS — Z125 Encounter for screening for malignant neoplasm of prostate: Secondary | ICD-10-CM | POA: Diagnosis not present

## 2018-12-24 LAB — LIPID PANEL
Cholesterol: 113 mg/dL (ref 0–200)
HDL: 57 mg/dL (ref >39.00)
LDL Cholesterol: 45 mg/dL (ref 0–99)
NonHDL: 56.01
Total CHOL/HDL Ratio: 2
Triglycerides: 54 mg/dL (ref 0.0–149.0)
VLDL: 10.8 mg/dL (ref 0.0–40.0)

## 2018-12-24 LAB — COMPREHENSIVE METABOLIC PANEL
ALT: 15 U/L (ref 0–53)
AST: 18 U/L (ref 0–37)
Albumin: 4.3 g/dL (ref 3.5–5.2)
Alkaline Phosphatase: 128 U/L — ABNORMAL HIGH (ref 39–117)
BUN: 12 mg/dL (ref 6–23)
CO2: 30 mEq/L (ref 19–32)
Calcium: 9.3 mg/dL (ref 8.4–10.5)
Chloride: 94 mEq/L — ABNORMAL LOW (ref 96–112)
Creatinine, Ser: 0.88 mg/dL (ref 0.40–1.50)
GFR: 90.26 mL/min (ref >60.00)
Glucose, Bld: 185 mg/dL — ABNORMAL HIGH (ref 70–99)
Potassium: 4.8 mEq/L (ref 3.5–5.1)
Sodium: 130 mEq/L — ABNORMAL LOW (ref 135–145)
Total Bilirubin: 0.8 mg/dL (ref 0.2–1.2)
Total Protein: 6.9 g/dL (ref 6.0–8.3)

## 2018-12-24 LAB — PSA: PSA: 0.23 ng/mL (ref 0.10–4.00)

## 2018-12-24 LAB — HEMOGLOBIN A1C: Hgb A1c MFr Bld: 7.5 % — ABNORMAL HIGH (ref 4.6–6.5)

## 2018-12-24 LAB — TSH: TSH: 0.18 u[IU]/mL — ABNORMAL LOW (ref 0.35–4.50)

## 2018-12-24 MED ORDER — DICLOFENAC SODIUM 1 % EX GEL: 2.0000 g | Freq: Four times a day (QID) | CUTANEOUS | 3 refills | Status: DC

## 2018-12-24 NOTE — Progress Notes (Signed)
Office Note 12/24/2018  CC:  Chief Complaint  Patient presents with  . Annual Exam    pt is fasting    HPI:  Daniel Stevenson is a 53 y.o. White male who is here for annual health maintenance exam.  DM: avg over last mo was 135-140.   Feet: no burning, tingling, or numbness.  Diet: "decent"  Exercise: walks quite a bit but no formal exercise habits. Smoking?->still smoking 1 ppd.  Says hands/fingers arthritis bothering him moderately, mild/mod pain and stiffness in most fingers. Has some mild hypertrophy of most finger joints, w/out any redness or warmth or tenderness.  Has been using voltaren gel and finds it helpful, asks for RF.  Past Medical History:  Diagnosis Date  . Back pain    s/p back surgery  . Chronic hyponatremia 08/2012 onset   Onset was right after starting amitriptyline for his neuropathic leg pain--has been stable.  Likely ADH-like effect on kidney from amitriptyline.  Marland Kitchen. History of low back pain 06/24/2011  . Hyperlipidemia   . Hypothyroidism   . LADA (latent autoimmune diabetes in adults), managed as type 1 (HCC) Dx'd 2014   Dr. Morrison [place].  . Migraines   . Neuropathic pain of right lower extremity   . PLANTAR WART, RIGHT 10/09/2009  . Snoring    Sleep study neg of OSA 04/2018  . Tobacco dependence    quit 2012; restarted not long after    Past Surgical History:  Procedure Laterality Date  . BACK SURGERY  2009   lower lumbar fusion- rubber disc in back  . CARDIOVASCULAR STRESS TEST  03/21/14   No ischemia/low risk study; however, LV function showed EF 49% and global LV hypokinesis--echo recommended and it showed normal LV fxn.  Marland Kitchen. NASAL SINUS SURGERY  2007  . TRANSTHORACIC ECHOCARDIOGRAM  03/24/14   Normal LV fxn, EF 50-55%, no wall motion abnormalities: (grade 1 diast dysfxn)    Family History  Problem Relation Age of Onset  . Emphysema Mother        smoker  . Obesity Father     Social History   Socioeconomic History  . Marital status:  Married    Spouse name: Not on file  . Number of children: Not on file  . Years of education: Not on file  . Highest education level: Not on file  Occupational History  . Not on file  Social Needs  . Financial resource strain: Not on file  . Food insecurity    Worry: Not on file    Inability: Not on file  . Transportation needs    Medical: Not on file    Non-medical: Not on file  Tobacco Use  . Smoking status: Current Every Day Smoker    Packs/day: 1.00    Years: 35.00    Pack years: 35.00    Types: Cigarettes    Last attempt to quit: 12/19/2010    Years since quitting: 8.0  . Smokeless tobacco: Never Used  Substance and Sexual Activity  . Alcohol use: Yes    Comment: rarely  . Drug use: No  . Sexual activity: Not on file  Lifestyle  . Physical activity    Days per week: Not on file    Minutes per session: Not on file  . Stress: Not on file  Relationships  . Social Musicianconnections    Talks on phone: Not on file    Gets together: Not on file    Attends religious service: Not on file  Active member of club or organization: Not on file    Attends meetings of clubs or organizations: Not on file    Relationship status: Not on file  . Intimate partner violence    Fear of current or ex partner: Not on file    Emotionally abused: Not on file    Physically abused: Not on file    Forced sexual activity: Not on file  Other Topics Concern  . Not on file  Social History Narrative   Married, 1 biologic child, 3 step children (all grown).   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   Smoker: 1-2 ppd x 17 yrs.  Rare ETOH, no drugs.   No exercise.          Outpatient Medications Prior to Visit  Medication Sig Dispense Refill  . amitriptyline (ELAVIL) 25 MG tablet TAKE 1 TABLET BY MOUTH IN THE MORNING AND 3 TABS AT BEDTIME FOR RIGHT LEG PAIN. 120 tablet 11  . atorvastatin (LIPITOR) 20 MG tablet Take 1 tablet (20 mg total) by mouth daily. 90  tablet 0  . Continuous Blood Gluc Sensor (FREESTYLE LIBRE SENSOR SYSTEM) MISC USE 1 EACH BY DOES NOT APPLY ROUTE EVERY 10 DAYS.  99  . fluticasone (FLONASE) 50 MCG/ACT nasal spray Place 2 sprays into both nostrils daily. 16 g 6  . insulin aspart (NOVOLOG FLEXPEN) 100 UNIT/ML FlexPen 6 units qAC 15 mL 1  . Insulin Glargine (LANTUS SOLOSTAR) 100 UNIT/ML Solostar Pen Inject 15 Units into the skin at bedtime.     . Insulin Pen Needle 32G X 4 MM MISC (BD ULTRA-FINE NEEDLES) USE 4 TIMES DAILY AS NEEDED. DX:E11.65 100 each 43  . SYNTHROID 175 MCG tablet     . diclofenac sodium (VOLTAREN) 1 % GEL Apply 4 g topically 4 (four) times daily. 100 g 0  . Continuous Blood Gluc Sensor ([place]) MISC by Does not apply route.     No facility-administered medications prior to visit.     Allergies  Allergen Reactions  . Augmentin [Amoxicillin-Pot Clavulanate] Nausea And Vomiting  . Oxycodone-Acetaminophen     REACTION: itching  . Levothyroxine Rash    Had rash associated with generic levothyroxine, but not brand-name Synthroid.  Likely allergic to a dye or filler in the generic preparation.    ROS Review of Systems  Constitutional: Negative for appetite change, chills, fatigue and fever.  HENT: Negative for congestion, dental problem, ear pain and sore throat.   Eyes: Negative for discharge, redness and visual disturbance.  Respiratory: Negative for cough, chest tightness, shortness of breath and wheezing.   Cardiovascular: Negative for chest pain, palpitations and leg swelling.  Gastrointestinal: Negative for abdominal pain, blood in stool, diarrhea, nausea and vomiting.  Genitourinary: Negative for difficulty urinating, dysuria, flank pain, frequency, hematuria and urgency.  Musculoskeletal: Negative for arthralgias, back pain, joint swelling, myalgias and neck stiffness.  Skin: Negative for pallor and rash.  Neurological: Negative for dizziness, speech difficulty, weakness and  headaches.  Hematological: Negative for adenopathy. Does not bruise/bleed easily.  Psychiatric/Behavioral: Negative for confusion and sleep disturbance. The patient is not nervous/anxious.     PE; Blood pressure 133/90, pulse (!) 105, temperature 98.7 F (37.1 C), temperature source Temporal, resp. rate 16, height 5\' 5"  (1.651 m), weight 149 lb 6.4 oz (67.8 kg), SpO2 97 %. Body mass index is 24.86 kg/m.  Gen: Alert, well appearing.  Patient is oriented to person, place, time, and situation. AFFECT: pleasant, lucid  thought and speech. ENT: Ears: EACs clear, normal epithelium.  TMs with good light reflex and landmarks bilaterally.  Eyes: no injection, icteris, swelling, or exudate.  EOMI, PERRLA. Nose: no drainage or turbinate edema/swelling.  No injection or focal lesion.  Mouth: lips without lesion/swelling.  Oral mucosa pink and moist.  Dentition intact and without obvious caries or gingival swelling.  Oropharynx without erythema, exudate, or swelling.  Neck: supple/nontender.  No LAD, mass, or TM.  Carotid pulses 2+ bilaterally, without bruits. CV: RRR, no m/r/g.   LUNGS: CTA bilat, nonlabored resps, good aeration in all lung fields. ABD: soft, NT, ND, BS normal.  No hepatospenomegaly or mass.  No bruits. EXT: no clubbing, cyanosis, or edema.  Musculoskeletal: no joint swelling, erythema, warmth, or tenderness.  ROM of all joints intact. Skin - no sores or suspicious lesions or rashes or color changes Rectal exam: negative without mass, lesions or tenderness, PROSTATE EXAM: smooth and symmetric without nodules or tenderness. Foot exam - bilateral normal; no swelling, tenderness or skin or vascular lesions. Color and temperature is normal. Sensation is intact. Peripheral pulses are palpable. Toenails are normal.    Pertinent labs:  Lab Results  Component Value Date   TSH 1.04 09/17/2018   Lab Results  Component Value Date   WBC 9.8 09/15/2018   HGB 13.9 09/15/2018   HCT 39.8  09/15/2018   MCV 88.2 09/15/2018   PLT 280 09/15/2018   Lab Results  Component Value Date   CREATININE 0.87 09/15/2018   BUN 11 09/15/2018   NA 123 (L) 09/15/2018   K 3.9 09/15/2018   CL 89 (L) 09/15/2018   CO2 24 09/15/2018   Lab Results  Component Value Date   ALT 14 11/18/2017   AST 17 11/18/2017   ALKPHOS 119 (H) 11/18/2017   BILITOT 0.7 11/18/2017   Lab Results  Component Value Date   CHOL 142 11/18/2017   Lab Results  Component Value Date   HDL 61.90 11/18/2017   Lab Results  Component Value Date   LDLCALC 65 11/18/2017   Lab Results  Component Value Date   TRIG 75.0 11/18/2017   Lab Results  Component Value Date   CHOLHDL 2 11/18/2017   Lab Results  Component Value Date   PSA 0.23 11/18/2017   PSA 0.26 11/14/2016   Lab Results  Component Value Date   HGBA1C 7.6 (H) 09/17/2018    ASSESSMENT AND PLAN:   1) DM 2, good control on basal and mealtime/bolus insulins. Feet exam normal today. He'll be arranging diabetic eye exam b/c he has ophtho coverage on insurance plan now.  2) Osteoarthritis, multiple joints but primarily hands/fingers: continue voltaren 1% gel, 2 g qid to affected areas.  3) Health maintenance exam: Reviewed age and gender appropriate health maintenance issues (prudent diet, regular exercise, health risks of tobacco and excessive alcohol, use of seatbelts, fire alarms in home, use of sunscreen).  Also reviewed age and gender appropriate health screening as well as vaccine recommendations. Vaccines: Flu->UTD 12/09/18.   Pneumovax 23 booster (DM)->given today.  Shingrix->he'll check with insurer about coverage. Labs: CMET, FLP, TSH, A1c (DM), PSA. Prostate ca screening: DRE normal today ,PSA. Colon ca screening: needs initial screening colonoscopy->referral to GI ordered today.  An After Visit Summary was printed and given to the patient.  FOLLOW UP:  Return in about 4 months (around 04/23/2019) for routine chronic illness  f/u.  Signed:  Santiago Bumpers, MD  12/24/2018  

## 2018-12-24 NOTE — Addendum Note (Signed)
Addended by: Deveron Furlong D on: 12/24/2018 08:40 AM   Modules accepted: Orders

## 2018-12-24 NOTE — Patient Instructions (Signed)
Call your insurer and ask about coverage for shingles vaccine (Shingrix).   Health Maintenance, Male Adopting a healthy lifestyle and getting preventive care are important in promoting health and wellness. Ask your health care provider about:  The right schedule for you to have regular tests and exams.  Things you can do on your own to prevent diseases and keep yourself healthy. What should I know about diet, weight, and exercise? Eat a healthy diet   Eat a diet that includes plenty of vegetables, fruits, low-fat dairy products, and lean protein.  Do not eat a lot of foods that are high in solid fats, added sugars, or sodium. Maintain a healthy weight Body mass index (BMI) is a measurement that can be used to identify possible weight problems. It estimates body fat based on height and weight. Your health care provider can help determine your BMI and help you achieve or maintain a healthy weight. Get regular exercise Get regular exercise. This is one of the most important things you can do for your health. Most adults should:  Exercise for at least 150 minutes each week. The exercise should increase your heart rate and make you sweat (moderate-intensity exercise).  Do strengthening exercises at least twice a week. This is in addition to the moderate-intensity exercise.  Spend less time sitting. Even light physical activity can be beneficial. Watch cholesterol and blood lipids Have your blood tested for lipids and cholesterol at 54 years of age, then have this test every 5 years. You may need to have your cholesterol levels checked more often if:  Your lipid or cholesterol levels are high.  You are older than 54 years of age.  You are at high risk for heart disease. What should I know about cancer screening? Many types of cancers can be detected early and may often be prevented. Depending on your health history and family history, you may need to have cancer screening at various  ages. This may include screening for:  Colorectal cancer.  Prostate cancer.  Skin cancer.  Lung cancer. What should I know about heart disease, diabetes, and high blood pressure? Blood pressure and heart disease  High blood pressure causes heart disease and increases the risk of stroke. This is more likely to develop in people who have high blood pressure readings, are of African descent, or are overweight.  Talk with your health care provider about your target blood pressure readings.  Have your blood pressure checked: ? Every 3-5 years if you are 40-60 years of age. ? Every year if you are 43 years old or older.  If you are between the ages of 68 and 39 and are a current or former smoker, ask your health care provider if you should have a one-time screening for abdominal aortic aneurysm (AAA). Diabetes Have regular diabetes screenings. This checks your fasting blood sugar level. Have the screening done:  Once every three years after age 62 if you are at a normal weight and have a low risk for diabetes.  More often and at a younger age if you are overweight or have a high risk for diabetes. What should I know about preventing infection? Hepatitis B If you have a higher risk for hepatitis B, you should be screened for this virus. Talk with your health care provider to find out if you are at risk for hepatitis B infection. Hepatitis C Blood testing is recommended for:  Everyone born from [birth year] through 1966.  Anyone with known risk factors for  hepatitis C. Sexually transmitted infections (STIs)  You should be screened each year for STIs, including gonorrhea and chlamydia, if: ? You are sexually active and are younger than 54 years of age. ? You are older than 54 years of age and your health care provider tells you that you are at risk for this type of infection. ? Your sexual activity has changed since you were last screened, and you are at increased risk for chlamydia or  gonorrhea. Ask your health care provider if you are at risk.  Ask your health care provider about whether you are at high risk for HIV. Your health care provider may recommend a prescription medicine to help prevent HIV infection. If you choose to take medicine to prevent HIV, you should first get tested for HIV. You should then be tested every 3 months for as long as you are taking the medicine. Follow these instructions at home: Lifestyle  Do not use any products that contain nicotine or tobacco, such as cigarettes, e-cigarettes, and chewing tobacco. If you need help quitting, ask your health care provider.  Do not use street drugs.  Do not share needles.  Ask your health care provider for help if you need support or information about quitting drugs. Alcohol use  Do not drink alcohol if your health care provider tells you not to drink.  If you drink alcohol: ? Limit how much you have to 0-2 drinks a day. ? Be aware of how much alcohol is in your drink. In the [place].S., one drink equals one 12 oz bottle of beer (355 mL), one 5 oz glass of wine (148 mL), or one 1 oz glass of hard liquor (44 mL). General instructions  Schedule regular health, dental, and eye exams.  Stay current with your vaccines.  Tell your health care provider if: ? You often feel depressed. ? You have ever been abused or do not feel safe at home. Summary  Adopting a healthy lifestyle and getting preventive care are important in promoting health and wellness.  Follow your health care provider's instructions about healthy diet, exercising, and getting tested or screened for diseases.  Follow your health care provider's instructions on monitoring your cholesterol and blood pressure. This information is not intended to replace advice given to you by your health care provider. Make sure you discuss any questions you have with your health care provider. Document Released: 07/27/2007 Document Revised: 01/21/2018  Document Reviewed: 01/21/2018 Elsevier Patient Education  2020 [employer].

## 2018-12-25 ENCOUNTER — Telehealth: Payer: Self-pay | Admitting: Family Medicine

## 2018-12-25 NOTE — Telephone Encounter (Signed)
Please advise, thanks.

## 2018-12-25 NOTE — Telephone Encounter (Signed)
Patient advised and voiced understanding.  

## 2018-12-25 NOTE — Telephone Encounter (Signed)
Patient was seen yesterday by Dr. Anitra Lauth. Patient received pneumococcal vaccine. Patient's wife reporting that his arm is swollen and hurts, fever and chills  Please advise. Patient can be contacted at [phone]

## 2018-12-25 NOTE — Telephone Encounter (Signed)
This can happen from getting vaccine, even if he's never had similar effect from a vaccine in the past. I recommend ice pack to the area of pain/swelling for 20-30 min every 4 hours. Ibuprofen 600 mg (three otc tabs) every 6 hours as needed.  Sx's should abate significantly over the next 24h.-thx

## 2019-01-06 ENCOUNTER — Other Ambulatory Visit: Payer: Self-pay | Admitting: Family Medicine

## 2019-02-25 DIAGNOSIS — E871 Hypo-osmolality and hyponatremia: Secondary | ICD-10-CM | POA: Diagnosis not present

## 2019-02-25 DIAGNOSIS — E039 Hypothyroidism, unspecified: Secondary | ICD-10-CM | POA: Diagnosis not present

## 2019-02-25 DIAGNOSIS — E139 Other specified diabetes mellitus without complications: Secondary | ICD-10-CM | POA: Diagnosis not present

## 2019-03-02 ENCOUNTER — Encounter: Payer: Self-pay | Admitting: Family Medicine

## 2019-03-03 NOTE — Telephone Encounter (Signed)
Please advise, thanks.

## 2019-03-05 ENCOUNTER — Other Ambulatory Visit: Payer: Self-pay | Admitting: Family Medicine

## 2019-04-20 ENCOUNTER — Other Ambulatory Visit: Payer: Self-pay

## 2019-04-22 ENCOUNTER — Other Ambulatory Visit: Payer: Self-pay

## 2019-04-23 ENCOUNTER — Encounter: Payer: Self-pay | Admitting: Family Medicine

## 2019-04-23 ENCOUNTER — Ambulatory Visit (INDEPENDENT_AMBULATORY_CARE_PROVIDER_SITE_OTHER): Payer: BC Managed Care – PPO | Admitting: Family Medicine

## 2019-04-23 VITALS — BP 123/79 | HR 97 | Temp 97.8°F | Resp 16 | Ht 65.0 in | Wt 145.0 lb

## 2019-04-23 DIAGNOSIS — E871 Hypo-osmolality and hyponatremia: Secondary | ICD-10-CM | POA: Diagnosis not present

## 2019-04-23 DIAGNOSIS — E039 Hypothyroidism, unspecified: Secondary | ICD-10-CM | POA: Diagnosis not present

## 2019-04-23 DIAGNOSIS — E139 Other specified diabetes mellitus without complications: Secondary | ICD-10-CM | POA: Diagnosis not present

## 2019-04-23 DIAGNOSIS — R7989 Other specified abnormal findings of blood chemistry: Secondary | ICD-10-CM

## 2019-04-23 LAB — BASIC METABOLIC PANEL
BUN: 11 mg/dL (ref 6–23)
CO2: 28 mEq/L (ref 19–32)
Calcium: 9.3 mg/dL (ref 8.4–10.5)
Chloride: 89 mEq/L — ABNORMAL LOW (ref 96–112)
Creatinine, Ser: 0.88 mg/dL (ref 0.40–1.50)
GFR: 90.15 mL/min (ref >60.00)
Glucose, Bld: 238 mg/dL — ABNORMAL HIGH (ref 70–99)
Potassium: 4.6 mEq/L (ref 3.5–5.1)
Sodium: 125 mEq/L — ABNORMAL LOW (ref 135–145)

## 2019-04-23 LAB — TSH: TSH: 0.22 u[IU]/mL — ABNORMAL LOW (ref 0.35–4.50)

## 2019-04-23 LAB — T4, FREE: Free T4: 1.64 ng/dL — ABNORMAL HIGH (ref 0.60–1.60)

## 2019-04-23 NOTE — Patient Instructions (Signed)
We are committed to keeping you informed about the COVID-19 vaccine.  As the vaccine continues to become available for each phase, we will ensure that patients who meet the criteria receive the information they need to access vaccination opportunities. Continue to check your MyChart account and [email or web address] for updates.   We are following [facility]'s phased rollout of the vaccine. Currently vaccines are being administered to health care workers, long term care staff and residents, anyone 55 years of age and older, and frontline essential workers.    [facility] has created mass vaccination clinics in [place], [place] and [place] counties in partnership with [place], [place] and [place] county health departments. Vaccination at these clinics is by appointment only. Walk-ins will NOT be accepted.  NOTICE: All [facility] appointments for vaccine are currently full. Please check back as we look to make more opportunities available soon.  [facility] is opening appointments weekly, based on the number of vaccine doses provided by the state. To receive email notices about when we will open additional appointments each week, please sign up for our weekly email with information about COVID-19 vaccine appointment availability   There are Currently Three Other Vaccination Opportunities in Our Area:  1. FEMA Community Vaccination Center ([place]) Eligible members of the public can make appointments online for a vaccine at the federally supported COVID-19 Community Vaccination Center in [place] at [address] [place]. Appointments can be made online by visiting [email or web address].  The FEMA site will operate seven days a week with the capacity to provide up to 3,000 vaccinations per day, with options for drive-thru service in the parking lot and service at an indoor clinic. The federal government will provide the center's vaccine supply,  which will be in addition to supply [facility] receives through [place]'s weekly allotment from the Centers for Disease Control.  2. County Health Departments County health departments in [place]'s service area are also scheduling vaccine appointments for those eligible. Learn more about each county's vaccination efforts in the website links below:  - [place]  - [place]  - [place]  - [place]  - [place]  3. [facility] throughout [place] are offering COVID-19 vaccines to eligible individuals. Click here to schedule appointments and learn more.  Find all vaccine locations throughout the state of [place] at [email or web address]  Vaccine Safety and Effectiveness All three vaccines authorized for emergency use by the U.S. Food and Drug Administration are recommended by the U.S. Centers for Disease Control and Prevention as safe and effective treatments for the prevention of COVID-19. These include the Pfizer, Moderna and Johnson & Johnson vaccines.  Clinical trials involving tens of thousands of people for each vaccine have shown that the vaccines are extremely effective in preventing COVID-19 with no serious safety concerns. Side effects reported include a sore arm at the injection site, fatigue, headache, chills and fever. While side effects are sometimes higher than for a typical flu vaccine, they are lower in many ways than side effects from the leading vaccine to prevent shingles.  Side effects are signs that a vaccine is working and are related to your immune system being stimulated to produce antibodies against infection. Side effects from vaccination are far less significant than health impacts from COVID-19.  Staying Informed Pharmacists, infectious disease doctors, critical care nurses and other experts at [facility] continue to speak publicly through media interviews and direct communication with our patients and communities about the safety,  effectiveness and importance   of vaccines to eliminate COVID-19.  In addition, reliable information on vaccine safety, effectiveness, side effects and more is available on the following websites:  [place] Department of Health and Human Services COVID-19 Vaccine Information Website.  U.S. Centers for Disease Control and Prevention COVID-19 Vaccine Public Information Website.  Staying Safe We agree with the CDC on what we can do to help our communities get back to normal: Getting "back to normal" is going to take all of our tools. If we use all the tools we have, we stand the best chance of getting our families, communities, schools and workplaces "back to normal" sooner:  Get vaccinated as soon as vaccines become available within the phase of the state's vaccination rollout plan for which you meet the eligibility criteria.  Wear a mask.  Stay 6 feet from others and avoid crowds.  Wash hands often.  Masking, social distancing and hand hygiene should be followed by everyone, including those who have been vaccinated.  For our most current information, please visit [email or web address].  

## 2019-04-23 NOTE — Progress Notes (Signed)
OFFICE VISIT  04/23/2019   CC:  Chief Complaint  Patient presents with  . Follow-up    RCI, pt is fasting   HPI:    Patient is a 54 y.o. Caucasian male who presents for 4 mo f/u DM, hypothyroidism, and chronic hyponatremia. A/P as of last visit: "1) DM 2, good control on basal and mealtime/bolus insulins. Feet exam normal today. He'll be arranging diabetic eye exam b/c he has ophtho coverage on insurance plan now.  2) Osteoarthritis, multiple joints but primarily hands/fingers: continue voltaren 1% gel, 2 g qid to affected areas.  3) Health maintenance exam: Reviewed age and gender appropriate health maintenance issues (prudent diet, regular exercise, health risks of tobacco and excessive alcohol, use of seatbelts, fire alarms in home, use of sunscreen).  Also reviewed age and gender appropriate health screening as well as vaccine recommendations. Vaccines: Flu->UTD 12/09/18.   Pneumovax 23 booster (DM)->given today.  Shingrix->he'll check with insurer about coverage. Labs: CMET, FLP, TSH, A1c (DM), PSA. Prostate ca screening: DRE normal today ,PSA. Colon ca screening: needs initial screening colonoscopy->referral to GI ordered today."  Interim hx:  He followed up with his endo MD again on 02/25/19 and his POC HbA1c was 7.4%. Dr. Tonna Boehringer plan as per his note that day: Recommendations 1) For now, would recommend continuing Lantus 15 units daily.  2) Take NovoLog before each meal as follows: A) 1 unit per 15 grams of carbohydrate  B) Add extra units if pre-meal blood glucose is over 200, as follows: 200-300 1 extra unit 301-400 2 extra units >400 3 extra units (When in doubt, take 5 units of NovoLog before eating.) 3) Continue using FreeStyle Libre personal CGM sensor for glucose monitoring. Will send Rx to pharmacy for him to upgrade to [place] 2 reader and sensors. 4) Again encouraged him to work on a plan for smoking cessation. He is not ready to commit to any changes at  this time.  5) Schedule for dilated eye exam.  6) Check TSH + FT4, cortisol, and urine microalbumin today.  7) Return for follow-up visit in 6 months.   Again, labs that day at endo showed TSH low, T4 elevated.  Cortisol normal, urine microalb normal.  He dropped his T4 dose to Past Medical History:  Diagnosis Date  . Back pain    s/p back surgery  . Chronic hyponatremia 08/2012 onset   Onset was right after starting amitriptyline for his neuropathic leg pain--has been stable.  Likely ADH-like effect on kidney from amitriptyline.  Marland Kitchen History of low back pain 06/24/2011  . Hyperlipidemia   . Hypothyroidism   . LADA (latent autoimmune diabetes in adults), managed as type 1 (HCC) Dx'd 2014   Dr. Morrison Old.  . Migraines   . Neuropathic pain of right lower extremity   . PLANTAR WART, RIGHT 10/09/2009  . Snoring    Sleep study neg of OSA 04/2018  . Tobacco dependence    quit 2012; restarted not long after    Past Surgical History:  Procedure Laterality Date  . BACK SURGERY  2009   lower lumbar fusion- rubber disc in back  . CARDIOVASCULAR STRESS TEST  03/21/14   No ischemia/low risk study; however, LV function showed EF 49% and global LV hypokinesis--echo recommended and it showed normal LV fxn.  Marland Kitchen NASAL SINUS SURGERY  2007  . TRANSTHORACIC ECHOCARDIOGRAM  03/24/14   Normal LV fxn, EF 50-55%, no wall motion abnormalities: (grade 1 diast dysfxn)    Outpatient Medications Prior  to Visit  Medication Sig Dispense Refill  . amitriptyline (ELAVIL) 25 MG tablet TAKE ONE TABLET BY MOUTH EVERY MORNING AND TAKE THREE TABLETS at bedtime FOR right IN LEG PAIN 120 tablet 5  . atorvastatin (LIPITOR) 20 MG tablet Take 1 tablet (20 mg total) by mouth daily. 90 tablet 0  . Continuous Blood Gluc Sensor (FREESTYLE LIBRE SENSOR SYSTEM) MISC USE 1 EACH BY DOES NOT APPLY ROUTE EVERY 10 DAYS.  99  . diclofenac Sodium (VOLTAREN) 1 % GEL Apply 2 g topically 4 (four) times daily. 100 g 3  . fluticasone (FLONASE)  50 MCG/ACT nasal spray Place 2 sprays into both nostrils daily. 16 g 3  . insulin aspart (NOVOLOG FLEXPEN) 100 UNIT/ML FlexPen 6 units qAC 15 mL 1  . Insulin Glargine (LANTUS SOLOSTAR) 100 UNIT/ML Solostar Pen Inject 15 Units into the skin at bedtime.     . Insulin Pen Needle 32G X 4 MM MISC (BD ULTRA-FINE NEEDLES) USE 4 TIMES DAILY AS NEEDED. DX:E11.65 100 each 11  . lisinopril (ZESTRIL) 5 MG tablet Take 5 mg by mouth daily.    Marland Kitchen SYNTHROID 150 MCG tablet      No facility-administered medications prior to visit.    Allergies  Allergen Reactions  . Augmentin [Amoxicillin-Pot Clavulanate] Nausea And Vomiting  . Oxycodone-Acetaminophen     REACTION: itching  . Levothyroxine Rash    Had rash associated with generic levothyroxine, but not brand-name Synthroid.  Likely allergic to a dye or filler in the generic preparation.    ROS As per HPI  PE: Blood pressure 123/79, pulse 97, temperature 97.8 F (36.6 C), temperature source Temporal, resp. rate 16, height 5\' 5"  (1.651 m), weight 145 lb (65.8 kg), SpO2 100 %. Gen: Alert, well appearing.  Patient is oriented to person, place, time, and situation. AFFECT: pleasant, lucid thought and speech. Neck - No masses or thyromegaly or limitation in range of motion CV: RRR, no m/r/g.   LUNGS: CTA bilat, nonlabored resps, good aeration in all lung fields. EXT: no clubbing or cyanosis.  no edema.  Bulbous shape to distal phalanges/nails but does not appear consistent with bony growth under base of nail plate---not clubbing.  LABS:  Lab Results  Component Value Date   TSH 0.18 (L) 12/24/2018   Lab Results  Component Value Date   WBC 9.8 09/15/2018   HGB 13.9 09/15/2018   HCT 39.8 09/15/2018   MCV 88.2 09/15/2018   PLT 280 09/15/2018   Lab Results  Component Value Date   CREATININE 0.88 12/24/2018   BUN 12 12/24/2018   NA 130 (L) 12/24/2018   K 4.8 12/24/2018   CL 94 (L) 12/24/2018   CO2 30 12/24/2018   Lab Results  Component Value  Date   ALT 15 12/24/2018   AST 18 12/24/2018   ALKPHOS 128 (H) 12/24/2018   BILITOT 0.8 12/24/2018   Lab Results  Component Value Date   CHOL 113 12/24/2018   Lab Results  Component Value Date   HDL 57.00 12/24/2018   Lab Results  Component Value Date   LDLCALC 45 12/24/2018   Lab Results  Component Value Date   TRIG 54.0 12/24/2018   Lab Results  Component Value Date   CHOLHDL 2 12/24/2018   Lab Results  Component Value Date   PSA 0.23 12/24/2018   PSA 0.23 11/18/2017   PSA 0.26 11/14/2016   Lab Results  Component Value Date   HGBA1C 7.5 (H) 12/24/2018    IMPRESSION AND  PLAN:  1) DM, LADA. Doing fine on current basal bolus insulin regimen outlined above under info about Dr. Silverio Lay last visit. Next Hba1c can be at f/u in 2 mo. He has had very mild microalbuminuria via testing by endo. Started on lisin 5mg  qd 02/2019.  Plan recheck microalb/cr 02/2020.  2) Hypothyroidism: last TSH low, T4 high, dose decreased to 150 mcg qd about 2 mo ago. Repeat TSH and free t4 today.  3) chronic hyponatremia: SIADH effect of amitriptyline. Recheck BMET today.  4) Memory loss: he brought this up today at end of visi   An After Visit Summary was printed and given to the patient.  FOLLOW UP: Return in about 2 months (around 06/23/2019) for f/u DM and hypoth.  Also, appt at your convenience for eval of your memory loss..  Signed:  Crissie Sickles, MD           04/23/2019

## 2019-04-26 ENCOUNTER — Other Ambulatory Visit: Payer: Self-pay

## 2019-04-26 MED ORDER — LEVOTHYROXINE SODIUM 137 MCG PO TABS: 137.0000 ug | ORAL_TABLET | Freq: Every day | ORAL | 1 refills | Status: DC

## 2019-05-06 ENCOUNTER — Other Ambulatory Visit: Payer: Self-pay

## 2019-05-07 ENCOUNTER — Encounter: Payer: Self-pay | Admitting: Family Medicine

## 2019-05-07 ENCOUNTER — Ambulatory Visit: Payer: BC Managed Care – PPO | Admitting: Family Medicine

## 2019-05-07 ENCOUNTER — Encounter: Payer: Self-pay | Admitting: Counselor

## 2019-05-07 VITALS — BP 161/92 | HR 87 | Temp 98.2°F | Resp 18 | Ht 65.0 in | Wt 148.0 lb

## 2019-05-07 DIAGNOSIS — F0632 Mood disorder due to known physiological condition with major depressive-like episode: Secondary | ICD-10-CM | POA: Diagnosis not present

## 2019-05-07 DIAGNOSIS — R413 Other amnesia: Secondary | ICD-10-CM | POA: Diagnosis not present

## 2019-05-07 DIAGNOSIS — G4700 Insomnia, unspecified: Secondary | ICD-10-CM | POA: Diagnosis not present

## 2019-05-07 DIAGNOSIS — F419 Anxiety disorder, unspecified: Secondary | ICD-10-CM | POA: Diagnosis not present

## 2019-05-07 MED ORDER — ATORVASTATIN CALCIUM 20 MG PO TABS: 20.0000 mg | ORAL_TABLET | Freq: Every day | ORAL | 3 refills | Status: DC

## 2019-05-07 NOTE — Progress Notes (Signed)
Office Note 05/07/2019  CC:  Chief Complaint  Patient presents with  . Memory Loss    HPI:  Daniel Stevenson is a 54 y.o. White male who is here accompanied by his wife for complaint of memory problems.  Lab results note as of last visit 2 wks ago: "Sodium stable. Kidney function normal. Thyroid level unchanged--still too high. Decrease dose to 137 mcg daily. Pls eRx levothyroxine 137 mcg tabs, 1 tab po qd, #30, RF x 1.  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  No big diff in word finding difficulty. Missing a lot of work, HA's. Poor sleep x 1 yr: poor sleep initiation and maintenance.  Leg pain not the issue. Says mind won't shut down.  Takes no otc sleep meds.  Worries all night. Covid has affected his work demand so he's been layed off at times lately. Poor recall of some big points of conversations.  Recalls peoples names fine.  No problems with driving. No coincidence with a new med or change in environment. Feeling depressed every day, irritable, guilty, frustrated for years.  Poor energy, impaired concentration. Energy level down.  He snores, no apneic events, sleep study neg for OSA in the past.  No abnl wt loss.  Still smoking. No alcohol in years.  No signif prob w/alc in the past. No drug abuse. No manic behavior, no halluc/delusions.  No abnormal body movements.  Denies FH of psychiatric problems.  Past Medical History:  Diagnosis Date  . Back pain    s/p back surgery  . Chronic hyponatremia 08/2012 onset   Onset was right after starting amitriptyline for his neuropathic leg pain--has been stable.  Likely ADH-like effect on kidney from amitriptyline.  Marland Kitchen History of low back pain 06/24/2011  . Hyperlipidemia   . Hypothyroidism   . LADA (latent autoimmune diabetes in  adults), managed as type 1 (HCC) Dx'd 2014   Dr. Morrison Old.  . Migraines   . Neuropathic pain of right lower extremity   . PLANTAR WART, RIGHT 10/09/2009  . Snoring    Sleep study neg of OSA 04/2018  . Tobacco dependence    quit 2012; restarted not long after    Past Surgical History:  Procedure Laterality Date  . BACK SURGERY  2009   lower lumbar fusion- rubber disc in back  . CARDIOVASCULAR STRESS TEST  03/21/14   No ischemia/low risk study; however, LV function showed EF 49% and global LV hypokinesis--echo recommended and it showed normal LV fxn.  Marland Kitchen NASAL SINUS SURGERY  2007  . TRANSTHORACIC ECHOCARDIOGRAM  03/24/14   Normal LV fxn, EF 50-55%, no wall motion abnormalities: (grade 1 diast dysfxn)    Family History  Problem Relation Age of Onset  . Emphysema Mother        smoker  . Obesity Father     Social History   Socioeconomic History  . Marital status: Married    Spouse name: Not on file  . Number of children: Not on file  . Years of education: Not on file  . Highest education level: Not on file  Occupational History  . Not on file  Tobacco Use  . Smoking status: Current Every Day Smoker    Packs/day: 1.00    Years: 35.00    Pack years: 35.00  Types: Cigarettes    Last attempt to quit: 12/19/2010    Years since quitting: 8.3  . Smokeless tobacco: Never Used  Substance and Sexual Activity  . Alcohol use: Yes    Comment: rarely  . Drug use: No  . Sexual activity: Not on file  Other Topics Concern  . Not on file  Social History Narrative   Married, 1 biologic child, 3 step children (all grown).   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   Smoker: 1-2 ppd x 17 yrs.  Rare ETOH, no drugs.   No exercise.         Social Determinants of Health   Financial Resource Strain:   . Difficulty of Paying Living Expenses:   Food Insecurity:   . Worried About Charity fundraiser in the Last Year:   . Arboriculturist in the Last Year:    Transportation Needs:   . Film/video editor (Medical):   Marland Kitchen Lack of Transportation (Non-Medical):   Physical Activity:   . Days of Exercise per Week:   . Minutes of Exercise per Session:   Stress:   . Feeling of Stress :   Social Connections:   . Frequency of Communication with Friends and Family:   . Frequency of Social Gatherings with Friends and Family:   . Attends Religious Services:   . Active Member of Clubs or Organizations:   . Attends Archivist Meetings:   Marland Kitchen Marital Status:   Intimate Partner Violence:   . Fear of Current or Ex-Partner:   . Emotionally Abused:   Marland Kitchen Physically Abused:   . Sexually Abused:     Outpatient Medications Prior to Visit  Medication Sig Dispense Refill  . amitriptyline (ELAVIL) 25 MG tablet TAKE ONE TABLET BY MOUTH EVERY MORNING AND TAKE THREE TABLETS at bedtime FOR right IN LEG PAIN 120 tablet 5  . atorvastatin (LIPITOR) 20 MG tablet Take 1 tablet (20 mg total) by mouth daily. 90 tablet 0  . diclofenac Sodium (VOLTAREN) 1 % GEL Apply 2 g topically 4 (four) times daily. 100 g 3  . fluticasone (FLONASE) 50 MCG/ACT nasal spray Place 2 sprays into both nostrils daily. 16 g 3  . insulin aspart (NOVOLOG FLEXPEN) 100 UNIT/ML FlexPen 6 units qAC 15 mL 1  . Insulin Glargine (LANTUS SOLOSTAR) 100 UNIT/ML Solostar Pen Inject 15 Units into the skin at bedtime.     Marland Kitchen levothyroxine (SYNTHROID) 137 MCG tablet Take 1 tablet (137 mcg total) by mouth daily before breakfast. 30 tablet 1  . lisinopril (ZESTRIL) 5 MG tablet Take 5 mg by mouth daily.    . Continuous Blood Gluc Sensor (FREESTYLE LIBRE SENSOR SYSTEM) MISC USE 1 EACH BY DOES NOT APPLY ROUTE EVERY 10 DAYS.  99  . Insulin Pen Needle 32G X 4 MM MISC (BD ULTRA-FINE NEEDLES) USE 4 TIMES DAILY AS NEEDED. DX:E11.65 100 each 11   No facility-administered medications prior to visit.    Allergies  Allergen Reactions  . Augmentin [Amoxicillin-Pot Clavulanate] Nausea And Vomiting  .  Oxycodone-Acetaminophen     REACTION: itching  . Levothyroxine Rash    Had rash associated with generic levothyroxine, but not brand-name Synthroid.  Likely allergic to a dye or filler in the generic preparation.    ROS Review of Systems  Constitutional: Negative for appetite change, chills, fatigue and fever.  HENT: Negative for congestion, dental problem, ear pain and sore throat.   Eyes: Negative for discharge, redness  and visual disturbance.  Respiratory: Negative for cough, chest tightness, shortness of breath and wheezing.   Cardiovascular: Negative for chest pain, palpitations and leg swelling.  Gastrointestinal: Negative for abdominal pain, blood in stool, diarrhea, nausea and vomiting.  Genitourinary: Negative for difficulty urinating, dysuria, flank pain, frequency, hematuria and urgency.  Musculoskeletal: Negative for arthralgias, back pain, joint swelling, myalgias and neck stiffness.  Skin: Negative for pallor and rash.  Neurological: Negative for dizziness, speech difficulty, weakness and headaches.  Hematological: Negative for adenopathy. Does not bruise/bleed easily.  Psychiatric/Behavioral: Negative for confusion and sleep disturbance. The patient is not nervous/anxious.     PE; Blood pressure (!) 161/92, pulse 87, temperature 98.2 F (36.8 C), temperature source Temporal, resp. rate 18, height 5\' 5"  (1.651 m), weight 148 lb (67.1 kg), SpO2 100 %. Body mass index is 24.63 kg/m. Gen: Alert, well appearing.  Patient is oriented to person, place, time, and situation. Neuro: CN 2-12 intact bilaterally, strength 5/5 in proximal and distal upper extremities and lower extremities bilaterally.  No sensory deficits.  No tremor.  No disdiadochokinesis.  No ataxia.  Upper extremity and lower extremity DTRs symmetric.  No pronator drift. MMSE today 30/30  Pertinent labs:  Lab Results  Component Value Date   TSH 0.22 (L) 04/23/2019   Lab Results  Component Value Date   WBC  9.8 09/15/2018   HGB 13.9 09/15/2018   HCT 39.8 09/15/2018   MCV 88.2 09/15/2018   PLT 280 09/15/2018   Lab Results  Component Value Date   CREATININE 0.88 04/23/2019   BUN 11 04/23/2019   NA 125 (L) 04/23/2019   K 4.6 04/23/2019   CL 89 (L) 04/23/2019   CO2 28 04/23/2019   Lab Results  Component Value Date   ALT 15 12/24/2018   AST 18 12/24/2018   ALKPHOS 128 (H) 12/24/2018   BILITOT 0.8 12/24/2018   Lab Results  Component Value Date   CHOL 113 12/24/2018   Lab Results  Component Value Date   HDL 57.00 12/24/2018   Lab Results  Component Value Date   LDLCALC 45 12/24/2018   Lab Results  Component Value Date   TRIG 54.0 12/24/2018   Lab Results  Component Value Date   CHOLHDL 2 12/24/2018   Lab Results  Component Value Date   PSA 0.23 12/24/2018   PSA 0.23 11/18/2017   PSA 0.26 11/14/2016   Lab Results  Component Value Date   HGBA1C 7.5 (H) 12/24/2018   Lab Results  Component Value Date   VITAMINB12 >1500 (H) 03/31/2015   ASSESSMENT AND PLAN:   Memory impairment,mild but very frustrating to pt and wife. Suspect mood/anxiety is primary issue--> memory is SYMPTOM. Refer for neuropsych testing. Keep f/u set for 06/25/19. No med changes today.  An After Visit Summary was printed and given to the patient.  FOLLOW UP:  Set for 06/25/19  Signed:  06/27/19, MD           05/07/2019

## 2019-05-07 NOTE — Addendum Note (Signed)
Addended by: Jeoffrey Massed on: 05/07/2019 09:19 AM   Modules accepted: Orders

## 2019-05-31 ENCOUNTER — Other Ambulatory Visit: Payer: Self-pay | Admitting: Family Medicine

## 2019-06-14 ENCOUNTER — Other Ambulatory Visit: Payer: Self-pay | Admitting: Family Medicine

## 2019-06-22 ENCOUNTER — Ambulatory Visit (INDEPENDENT_AMBULATORY_CARE_PROVIDER_SITE_OTHER): Payer: BC Managed Care – PPO | Admitting: Counselor

## 2019-06-22 ENCOUNTER — Other Ambulatory Visit: Payer: Self-pay

## 2019-06-22 ENCOUNTER — Ambulatory Visit: Payer: BC Managed Care – PPO

## 2019-06-22 ENCOUNTER — Encounter: Payer: Self-pay | Admitting: Counselor

## 2019-06-22 DIAGNOSIS — F0631 Mood disorder due to known physiological condition with depressive features: Secondary | ICD-10-CM | POA: Diagnosis not present

## 2019-06-22 DIAGNOSIS — F09 Unspecified mental disorder due to known physiological condition: Secondary | ICD-10-CM

## 2019-06-22 DIAGNOSIS — R4189 Other symptoms and signs involving cognitive functions and awareness: Secondary | ICD-10-CM | POA: Diagnosis not present

## 2019-06-22 NOTE — Progress Notes (Signed)
NEUROPSYCHOLOGICAL EVALUATION [facility]  Patient Name: Daniel Stevenson MRN: [id number] Date of Birth: 01/17/1966 Age: 54 y.o. Education: 12 years  Referral Circumstances and Background Information  Daniel Stevenson is a 54 y.o., right-hand dominant, married man with a history of difficult to control hypothyroidism, DMI, headaches, depression/anxiety, and memory and thinking problems since about September, 2020. He was referred by his PCP, Dr. Milinda Cave, who noted an MMSE of 30 and felt that his issues were more likely related to depression and anxiety. My thanks to Dr. Milinda Cave for his detailed and very helpful notes.  On interview, the patient corroborated the history presented in the chart. It sounds as though in general, he has felt as if he is in a state of decline for the past three years. His diabetes is hard to manage, he feels like he can't do what he used to, and that depresses him. He works as an Probation officer and feels less sure about how to do things he has done for many years on the job. He denied that anyone at work has commented on his issues or really noticed them, but he notices a change. He does discuss them with a Radio broadcast assistant. He is distractible and has a hard time staying on task, for instance he will get things out of the car, put them on the ground, and then forget to bring them in the house. He feels like he has a harder time with household repairs. He misplaces things frequently. He tends to get upset when he makes cognitive errors or is unsure about something, and that may be compounding the problem. He stated that his wife has noticed his issues. He is getting headaches, 3-4 days a week, that sometimes are significant enough he has to stay out of work. He was unclear as to whether he gets auras (he sees flashing lights at times but thinks they are more associated with his blood sugar). They are typically right frontal, sometimes he wakes up with them, and sometimes they occur  during the day. He estimated he misses about 4 days of work a month related to headaches. He has some arthritis pain in his hands that bothers him. He feels like his cognitive issues are not every day, he has good days and bad days, but he does think his bad days are increasing in frequency over time.   With respect to mood, the patient reported that he worries a fair amount, he feels frustrated and angry over the past 3 years. He is upset he can't do what he used to do. His diabetes holds him back a lot and that bothers him. It makes it hard to get through a day of work at times because he will have low blood sugar and have to take a break. He worries he will not be able to provide for his family and feels guilty about putting everything on his wife. His wife has suggested that he file for disability, but he doesn't think he has a chance of getting it even though he would like to. He feels exhausted every day after he does a day of work and often will have to pull over on the way home because his blood sugar drops. He stated that he is only sleeping 3 or 4 hours a night, he feels like his mind doesn't ever slow down. He often worries about being able to keep doing his job related to his health condition. He described it as a "constant battle" managing  his diabetes. He became tearful when discussing his mood and stated that he feels sad most of the time. He feels like "half the person that he used to be," and his energy is very low.   With respect to functioning, the patient is still doing fairly well. He is still driving and isn't getting lost, he is managing his own medications independently, he doesn't manage the finances (his wife does that) but he can keep track of them. He is still upkeeping his property and is able to fix things at home (although it's harder), he takes care of the outside things. He is still able to do his job and is performing adequately, even though sometimes he doesn't feel like he  is.  Past Medical History and Review of Relevant Studies   Patient Active Problem List   Diagnosis Date Noted  . Lymphadenitis, acute 05/02/2015  . Headache 01/10/2015  . Fatigue 01/10/2015  . Allergic rhinitis 01/10/2015  . Medication intolerance 09/06/2013  . Health maintenance examination 07/13/2013  . Type II or unspecified type diabetes mellitus without mention of complication, uncontrolled 01/18/2013  . GERD (gastroesophageal reflux disease) 01/18/2013  . Right leg pain 08/18/2012  . Ganglion cyst of flexor tendon sheath 08/05/2011  . History of low back pain 06/24/2011  . Tobacco dependence 11/20/2010  . Hypothyroidism 10/09/2009   Review of Neuroimaging and Relevant Medical History:  There is no neuroimaging on file for review.   Mini mental state exam on 05/07/2019 from Dr. Milinda Cave was 30/30.  Current Outpatient Medications  Medication Sig Dispense Refill  . amitriptyline (ELAVIL) 25 MG tablet TAKE ONE TABLET BY MOUTH EVERY MORNING AND TAKE THREE TABLETS at bedtime FOR right IN LEG PAIN 120 tablet 5  . atorvastatin (LIPITOR) 20 MG tablet Take 1 tablet (20 mg total) by mouth daily. 90 tablet 3  . Continuous Blood Gluc Sensor (FREESTYLE LIBRE SENSOR SYSTEM) MISC USE 1 EACH BY DOES NOT APPLY ROUTE EVERY 10 DAYS.  99  . diclofenac Sodium (VOLTAREN) 1 % GEL Apply 2 g topically 4 (four) times daily. 100 g 3  . fluticasone (FLONASE) 50 MCG/ACT nasal spray Place 2 sprays into both nostrils daily. 16 g 3  . insulin aspart (NOVOLOG FLEXPEN) 100 UNIT/ML FlexPen 6 units qAC 15 mL 1  . Insulin Glargine (LANTUS SOLOSTAR) 100 UNIT/ML Solostar Pen Inject 15 Units into the skin at bedtime.     . Insulin Pen Needle 32G X 4 MM MISC (BD ULTRA-FINE NEEDLES) USE 4 TIMES DAILY AS NEEDED. DX:E11.65 100 each 11  . lisinopril (ZESTRIL) 5 MG tablet Take 5 mg by mouth daily.    Marland Kitchen SYNTHROID 137 MCG tablet Take 1 tablet (137 mcg total) by mouth daily before breakfast. 30 tablet 0   No current  facility-administered medications for this visit.   Family History  Problem Relation Age of Onset  . Emphysema Mother        smoker  . Obesity Father    There is a family history of dementia. His maternal grandfather developed dementia in his mid 32s. He wasn't aware of other family members with it. There is no  family history of psychiatric illness.  Psychosocial History  Developmental, Educational and Employment History: The patient stated that school was not his thing and he had a hard time learning to read and learning to spell. He can do simple things but has a hard time reading anything other than very simple material. He also has a hard time with spelling  and thinks he was passed through. He was held back in elementary school, in 1st or 2nd grade. He was also in special classes for all subjects. He wasn't aware of a specific diagnosis. 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 He is typically working about 36 hours a week. 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   Psychiatric History: The patient denied any previous history of mental health issues. He thinks that his mood issues have continually worsened over about the past three years as his health has become more problematic.   Substance Use History: The patient smokes cigarettes, about a pack and a half a day. He quit drinking alcohol when he was diagnosed with diabetes. He used to drink about 6 beers a day for many years. He doesn't use any illicit drugs.   Relationship History and Living Cimcumstances: The patient has been married 18 years and described his relationship as supportive. He has one biological daughter from a previous relationship (he was married for 1 year before). He has 3 stepchildren. His youngest stepson lives with him and is 57.   Mental Status and Behavioral  Observations  Sensorium/Arousal: The patient's level of arousal was awake and alert. Hearing and vision were adequate for testing purposes. Orientation: The patient was alert and fully oriented.  Appearance: Appeared quite anxious, somewhat hyperkinetic, and in affective distress throughout much of the encounter.  Behavior: Participated actively and appeared well engaged in the testing Speech/language: Somewhat fast in rate, otherwise normal and without word finding pauses Gait/Posture: Not well observed, appeared normal on observation of ambulation within the clinic.  Movement: No overt signs/symptoms of movement disorder such as adventitious movements, hypokinesis, masked facies etc.  Social Comportment: Appropriate within social norms Mood: Depressed Affect: Anxious, dysphoric, depressed Thought process/content: Thought process was logical, linear, and goal-directed. The patient was able to provide a fairly detailed personal history and timeline. He had no delusional or inappropriate thought content.  Safety: No thoughts of harming self or others identified on direct questioning.  Insight: Fair, patient is if anything overconcerned about cognitive difficulties  Test Procedures  Wide Range Achievement Test - 4   Word Reading Wechsler Adult Intelligence Scale - IV  Digit Span  Arithmetic  Symbol Search  Coding Repeatable Battery for the Assessment of Neuropsychological Status (Form A) ACS Word Choice The Dot Counting Test Controlled Oral Word Association (F-A-S) Semantic Fluency (Animals) Trail Making Test A & B Wisconsin Card Sorting Test [phone] Patient Health Questionnaire - 9  GAD-7  Plan  BRAILON DON was seen for a psychiatric diagnostic evaluation and neuropsychological testing. He has a history of what sound like fairly subtle issues with memory and thinking since September, 2020 in the setting of significant anxiety, depression, and feelings of decrepitude related to his  health. He has a physical job and his diabetes really slows him down. He is used to doing a lot of work and has some negative affect around his limitations. He also likely has a history of learning disorder, which must be taken into account interpreting his test performance. Full and complete note with impressions, recommendations, and interpretation of test data to follow.   Viviano Simas Nicole Kindred, PsyD, [place] Clinical Neuropsychologist  Informed Consent and Coding/Compliance  Risks and benefits of the evaluation were discussed with the  patient prior to all testing procedures. I conducted a clinical interview and neuropsychological testing (at least two tests) with Mayer Masker and Clare Charon, B.S. (Technician) assisted me in administering additional test procedures. The patient was able to tolerate the testing procedures and the patient (and/or family if applicable) is likely to benefit from further follow up to receive the diagnosis and treatment recommendations, which will be rendered at the next encounter. Billing below reflects technician time, my direct face-to-face time with the patient, time spent in test administration, and time spent in professional activities including but not limited to: neuropsychological test interpretation, integration of neuropsychological test data with clinical history, report preparation, treatment planning, care coordination, and review of diagnostically pertinent medical history or studies.   Services associated with this encounter: Clinical Interview [phone]) plus 60 minutes (42353; Neuropsychological Evaluation by Professional)  120 minutes (61443; Neuropsychological Evaluation by Professional, Adl.) 30 minutes (15400; Test Administration by Professional) 30 minutes (86761; Neuropsychological Testing by Technician) 80 minutes (95093; Neuropsychological Testing by Technician, Adl.)

## 2019-06-22 NOTE — Progress Notes (Signed)
   Psychometrist Note   Cognitive testing was administered to Daniel Stevenson by Lamar Benes, B.S. (Technician) under the supervision of Alphonzo Severance, Psy.D., ABN. [name] was able to tolerate all test procedures. Dr. Nicole Kindred met with the patient as needed to manage any emotional reactions to the testing procedures (if applicable). Rest breaks were offered.    The battery of tests administered was selected by Dr. Nicole Kindred with consideration to the patient's current level of functioning, the nature of his symptoms, emotional and behavioral responses during the interview, level of literacy, observed level of motivation/effort, and the nature of the referral question. This battery was communicated to the psychometrist. Communication between Dr. Nicole Kindred and the psychometrist was ongoing throughout the evaluation and Dr. Nicole Kindred was immediately accessible at all times. Dr. Nicole Kindred provided supervision to the technician on the date of this service, to the extent necessary to assure the quality of all services provided.    [name] will return in approximately one week for an interactive feedback session with Dr. Nicole Kindred, at which time male test performance, clinical impressions, and treatment recommendations will be reviewed in detail. The patient understands he can contact our office should he require our assistance before this time.   A total of 110 minutes of billable time were spent with Daniel Stevenson by the technician, including test administration and scoring time. Billing for these services is reflected in Dr. Les Pou note.   This note reflects time spent with the psychometrician and does not include test scores, clinical history, or any interpretations made by Dr. Nicole Kindred. The full report will follow in a separate note.

## 2019-06-23 ENCOUNTER — Encounter: Payer: Self-pay | Admitting: Counselor

## 2019-06-24 NOTE — Progress Notes (Signed)
NEUROPSYCHOLOGICAL TEST SCORES [facility]  Patient Name: NEGAN GRUDZIEN MRN: [id number] Date of Birth: 1965/06/02 Age: 54 y.o. Education: 12 years  Measurement properties of test scores: IQ, Index, and Standard Scores (SS): Mean = 100; Standard Deviation = 15 Scaled Scores (Ss): Mean = 10; Standard Deviation = 3 Z scores (Z): Mean = 0; Standard Deviation = 1 T scores (T); Mean = 50; Standard Deviation = 10  TEST SCORES:    Note: This summary of test scores accompanies the interpretive report and should not be considered in isolation without reference to the appropriate sections in the text. Test scores are relative to age, gender, and educational history as available and appropriate.   Performance Validity        Rey 15 and Recognition: Raw Descriptor      Free Recall 9 Below Expectation      Recognition 6 ---      False Positives 1 ---      Combined Score 14 Below Expectation      MSVT: Raw Descriptor      Immediate Recall 100 Within Expectation      Delayed Recall 100 Within Expectation      Consistency 100 Within Expectation      Paired Associates 100 ---      Free Recall 60 ---          The Dot Counting Test: Raw Descriptor      E-Score 16 Within Expectation      Embedded Measures: Raw Descriptor      RBANS Effort Index: 0 Within Expectation      WAIS-IV Reliable Digit Span: 7 Within Expectation      WAIS-IV Reliable Digit Span Revised 10 Below Expectation      Expected Functioning        Wide Range Achievement Test (Word Reading): Standard/Scaled Score Percentile       Word Reading 76 5      Reynolds Intellectual Screening Test Standard/T-score Percentile      Guess What 46 35      Odd Item Out 44 28  RIST Index 93 33      Cognitive Testing        RBANS, Form : Standard/Scaled Score Percentile  Total Score 78 7  Immediate Memory 65 1      List Learning 3 1      Story Memory 5 5  Visuospatial/Constructional 75 5      Figure Copy   (15) 5 5       Line Orientation --- 10-16  Language 94 34      Picture Naming --- 51-75      Semantic Fluency 8 25  Attention 85 16      Digit Span 10 50      Coding 5 5  Delayed Memory 91 27      List Recall   (3) --- 3-9      List Recognition   (19) --- 26-50      Story Recall   (8) 8 25      Figure Recall   (6) 4 2      Wechsler Adult Intelligence Scale - IV: Standard/Scaled Score Percentile  Working Memory Index 77 6      Digit Span 5 5          Digit Span Forward 9 37          Digit Span Backward 5 5  Digit Span Sequencing 6 9      Arithmetic 7 16  Processing Speed Index 79 8      Symbol Search 6 9      Coding 6 9      Verbal Fluency: T-score Percentile      Controlled Oral Word Association (F-A-S) 39 14      Semantic Fluency (Animals) 33 5      Trail Making Test: T-Score Percentile      Part A 52 58      Part B 42 21      Modified Wisconsin Card Sorting Test (MWCST): Standard/T-Score Percentile      Number of Categories Correct 42 22      Number of Perseverative Errors 37 10      Number of Total Errors 42 22      Percent Perseverative Errors 37 10  Executive Function Composite 83 13      Boston Diagnostic Aphasia Exam: Raw Score Scaled Score      Complex Ideational Material 11 9      Clock Drawing Raw Score Descriptor      Command 9 WNL      Rating Scales         Raw Score Descriptor  Patient Health Questionnaire - 9 17 Severe Depression  GAD-7 19 Severe Depression    Romar Woodrick V. Nicole Kindred PsyD, [place] Clinical Neuropsychologist

## 2019-06-25 ENCOUNTER — Ambulatory Visit: Payer: BC Managed Care – PPO | Admitting: Family Medicine

## 2019-06-28 ENCOUNTER — Ambulatory Visit: Payer: BC Managed Care – PPO | Admitting: Family Medicine

## 2019-06-28 ENCOUNTER — Other Ambulatory Visit: Payer: Self-pay

## 2019-06-28 ENCOUNTER — Encounter: Payer: Self-pay | Admitting: Family Medicine

## 2019-06-28 VITALS — BP 124/74 | HR 80 | Temp 98.1°F | Resp 16 | Ht 65.0 in | Wt 148.6 lb

## 2019-06-28 DIAGNOSIS — E039 Hypothyroidism, unspecified: Secondary | ICD-10-CM | POA: Diagnosis not present

## 2019-06-28 DIAGNOSIS — E119 Type 2 diabetes mellitus without complications: Secondary | ICD-10-CM | POA: Diagnosis not present

## 2019-06-28 DIAGNOSIS — R809 Proteinuria, unspecified: Secondary | ICD-10-CM

## 2019-06-28 DIAGNOSIS — E871 Hypo-osmolality and hyponatremia: Secondary | ICD-10-CM | POA: Diagnosis not present

## 2019-06-28 DIAGNOSIS — R413 Other amnesia: Secondary | ICD-10-CM

## 2019-06-28 DIAGNOSIS — E7800 Pure hypercholesterolemia, unspecified: Secondary | ICD-10-CM | POA: Diagnosis not present

## 2019-06-28 DIAGNOSIS — K219 Gastro-esophageal reflux disease without esophagitis: Secondary | ICD-10-CM

## 2019-06-28 DIAGNOSIS — R7989 Other specified abnormal findings of blood chemistry: Secondary | ICD-10-CM

## 2019-06-28 DIAGNOSIS — F321 Major depressive disorder, single episode, moderate: Secondary | ICD-10-CM

## 2019-06-28 LAB — COMPREHENSIVE METABOLIC PANEL
ALT: 12 U/L (ref 0–53)
AST: 15 U/L (ref 0–37)
Albumin: 3.9 g/dL (ref 3.5–5.2)
Alkaline Phosphatase: 128 U/L — ABNORMAL HIGH (ref 39–117)
BUN: 10 mg/dL (ref 6–23)
CO2: 26 mEq/L (ref 19–32)
Calcium: 8.5 mg/dL (ref 8.4–10.5)
Chloride: 93 mEq/L — ABNORMAL LOW (ref 96–112)
Creatinine, Ser: 0.87 mg/dL (ref 0.40–1.50)
GFR: 91.29 mL/min (ref >60.00)
Glucose, Bld: 196 mg/dL — ABNORMAL HIGH (ref 70–99)
Potassium: 4.2 mEq/L (ref 3.5–5.1)
Sodium: 126 mEq/L — ABNORMAL LOW (ref 135–145)
Total Bilirubin: 0.5 mg/dL (ref 0.2–1.2)
Total Protein: 6.1 g/dL (ref 6.0–8.3)

## 2019-06-28 LAB — MICROALBUMIN / CREATININE URINE RATIO
Creatinine,U: 48.1 mg/dL
Microalb Creat Ratio: 4.8 mg/g (ref 0.0–30.0)
Microalb, Ur: 2.3 mg/dL — ABNORMAL HIGH (ref 0.0–1.9)

## 2019-06-28 LAB — HEMOGLOBIN A1C: Hgb A1c MFr Bld: 7 % — ABNORMAL HIGH (ref 4.6–6.5)

## 2019-06-28 MED ORDER — PANTOPRAZOLE SODIUM 40 MG PO TBEC: 40.0000 mg | DELAYED_RELEASE_TABLET | Freq: Every day | ORAL | 3 refills | Status: DC

## 2019-06-28 MED ORDER — CITALOPRAM HYDROBROMIDE 20 MG PO TABS: 20.0000 mg | ORAL_TABLET | Freq: Every day | ORAL | 0 refills | Status: DC

## 2019-06-28 NOTE — Progress Notes (Signed)
OFFICE VISIT  06/28/2019   CC:  Chief Complaint  Patient presents with  . Follow-up    RCI, pt is not fasting   HPI:    Patient is a 54 y.o. Caucasian male who presents for 2 mo f/u DM 2 (LADA), hyperlipidemia, hypothyroidism, and memory impairment. I felt like his memory impairment 04/2019 was likely due to chronic anxiety.  I referred him for neuropsychiatric testing at that time.  Hypothyroidism: TSH low 04/2019 so I decreased his levothyroxine dose to 137 mcg daily. Takes this med correctly.  JHE:[id number] statin.  DM 2: lantus 15 U qhs, novolog 6 U qAC.  Fastings typically 100-110, later in day 140s 2H PP.   Memory: got neurocog testing about 1 week ago, awaiting psychologist recheck to go over results tomorrow. He says his memory issues are essentially the same over the last few months. He does feel like he's been chronically depressed the last 1 yr or more, says he feels like he's not functioning well, says the worry of his chronic illnesses has him down as well.  No SI or HI.  Reports about 4-6 mo hx of frequent substernal burning, indigestion, seemingly assoc with all foods but has mild nausea as well with high fat/greasy/spicy foods.  He does not overeat.  Has tried otc prilosec and not improved.  No dysphagia or odynophagia.    ROS: no fevers, no CP, no SOB, no wheezing, no cough, no dizziness, no HAs, no rashes, no melena/hematochezia.  No polyuria or polydipsia.  No myalgias or arthralgias.  No focal weakness, paresthesias, or tremors.  No acute vision or hearing abnormalities. No n/v/d or abd pain.  No palpitations.     Past Medical History:  Diagnosis Date  . Back pain    s/p back surgery  . Chronic hyponatremia 08/2012 onset   Onset was right after starting amitriptyline for his neuropathic leg pain--has been stable.  Likely ADH-like effect on kidney from amitriptyline.  Marland Kitchen History of low back pain 06/24/2011  . Hyperlipidemia   . Hypothyroidism   . LADA (latent  autoimmune diabetes in adults), managed as type 1 (HCC) Dx'd 2014   Dr. Morrison Old.  . Migraines   . Neuropathic pain of right lower extremity   . PLANTAR WART, RIGHT 10/09/2009  . Snoring    Sleep study neg of OSA 04/2018  . Tobacco dependence    quit 2012; restarted not long after    Past Surgical History:  Procedure Laterality Date  . BACK SURGERY  2009   lower lumbar fusion- rubber disc in back  . CARDIOVASCULAR STRESS TEST  03/21/14   No ischemia/low risk study; however, LV function showed EF 49% and global LV hypokinesis--echo recommended and it showed normal LV fxn.  Marland Kitchen NASAL SINUS SURGERY  2007  . TRANSTHORACIC ECHOCARDIOGRAM  03/24/14   Normal LV fxn, EF 50-55%, no wall motion abnormalities: (grade 1 diast dysfxn)    Outpatient Medications Prior to Visit  Medication Sig Dispense Refill  . amitriptyline (ELAVIL) 25 MG tablet TAKE ONE TABLET BY MOUTH EVERY MORNING AND TAKE THREE TABLETS at bedtime FOR right IN LEG PAIN 120 tablet 5  . atorvastatin (LIPITOR) 20 MG tablet Take 1 tablet (20 mg total) by mouth daily. 90 tablet 3  . Continuous Blood Gluc Sensor (FREESTYLE LIBRE SENSOR SYSTEM) MISC USE 1 EACH BY DOES NOT APPLY ROUTE EVERY 10 DAYS.  99  . diclofenac Sodium (VOLTAREN) 1 % GEL Apply 2 g topically 4 (four) times daily. 100  g 3  . fluticasone (FLONASE) 50 MCG/ACT nasal spray Place 2 sprays into both nostrils daily. 16 g 3  . insulin aspart (NOVOLOG FLEXPEN) 100 UNIT/ML FlexPen 6 units qAC 15 mL 1  . Insulin Glargine (LANTUS SOLOSTAR) 100 UNIT/ML Solostar Pen Inject 15 Units into the skin at bedtime.     . Insulin Pen Needle 32G X 4 MM MISC (BD ULTRA-FINE NEEDLES) USE 4 TIMES DAILY AS NEEDED. DX:E11.65 100 each 11  . lisinopril (ZESTRIL) 5 MG tablet Take 5 mg by mouth daily.    Marland Kitchen SYNTHROID 137 MCG tablet Take 1 tablet (137 mcg total) by mouth daily before breakfast. 30 tablet 0   No facility-administered medications prior to visit.    Allergies  Allergen Reactions  .  Augmentin [Amoxicillin-Pot Clavulanate] Nausea And Vomiting  . Oxycodone-Acetaminophen     REACTION: itching  . Levothyroxine Rash    Had rash associated with generic levothyroxine, but not brand-name Synthroid.  Likely allergic to a dye or filler in the generic preparation.    ROS As per HPI  PE: Vitals with BMI 06/28/2019 05/07/2019 04/23/2019  Height 5\' 5"  5\' 5"  5\' 5"   Weight 148 lbs 10 oz 148 lbs 145 lbs  BMI 24.73 24.63 24.13  Systolic 124 161  Diastolic 74 92 79  Pulse 80 87 97    Gen: Alert, well appearing.  Patient is oriented to person, place, time, and situation. AFFECT: pleasant but a bid sad.  Lucid thought and speech.  LABS:  Lab Results  Component Value Date   TSH 0.22 (L) 04/23/2019   Lab Results  Component Value Date   WBC 9.8 09/15/2018   HGB 13.9 09/15/2018   HCT 39.8 09/15/2018   MCV 88.2 09/15/2018   PLT 280 09/15/2018   Lab Results  Component Value Date   CREATININE 0.88 04/23/2019   BUN 11 04/23/2019   NA 125 (L) 04/23/2019   K 4.6 04/23/2019   CL 89 (L) 04/23/2019   CO2 28 04/23/2019   Lab Results  Component Value Date   ALT 15 12/24/2018   AST 18 12/24/2018   ALKPHOS 128 (H) 12/24/2018   BILITOT 0.8 12/24/2018   Lab Results  Component Value Date   CHOL 113 12/24/2018   Lab Results  Component Value Date   HDL 57.00 12/24/2018   Lab Results  Component Value Date   LDLCALC 45 12/24/2018   Lab Results  Component Value Date   TRIG 54.0 12/24/2018   Lab Results  Component Value Date   CHOLHDL 2 12/24/2018   Lab Results  Component Value Date   PSA 0.23 12/24/2018   PSA 0.23 11/18/2017   PSA 0.26 11/14/2016   Lab Results  Component Value Date   HGBA1C 7.5 (H) 12/24/2018   IMPRESSION AND PLAN:  1) MDD with significant anxiety as well.  Mild/mod. Start trial of citalopram 20 mg qd.  Therapeutic expectations and side effect profile of medication discussed today.  Patient's questions answered. I believe his memory  impairment is secondary to his mood/anxiety d/o. He'll have f/u with psych/neurocog testing results reviewed TOMORROW.  2) GERD: pt assoc the onset of this after starting his ACE-I.  Low likelihood of GERD side effect from this med but possible.  He'll do trial of his lisinopril and see if GERD much improved.  If so, he'll monitor bp as well as call our office and we'll change to trial of ARB for his proteinuria.  If not improved off lisinopril  then restart it and start taking daily PPI that I rx'd today (pantoprazole 40mg  qd).  3)) DM (LADA): stable per home gluc monitoring.  Hx of microalbuminuria. HbA1c, urine microalb/cr today. Lytes/cr today.    4) HLD: tolerating statin.  Not fasting today.  Last LDL was 45 six mo ago. Hepatic panel today. Rpt lipids 6 mo.  5) Hypothyroidism: monitor TSH today and see how lower dose of T4 did.  An After Visit Summary was printed and given to the patient.  FOLLOW UP: Return in about 4 weeks (around 07/26/2019) for f/u MDD.  Signed:  Crissie Sickles, MD           06/28/2019

## 2019-06-29 ENCOUNTER — Other Ambulatory Visit: Payer: Self-pay

## 2019-06-29 ENCOUNTER — Ambulatory Visit (INDEPENDENT_AMBULATORY_CARE_PROVIDER_SITE_OTHER): Payer: BC Managed Care – PPO | Admitting: Counselor

## 2019-06-29 DIAGNOSIS — F418 Other specified anxiety disorders: Secondary | ICD-10-CM | POA: Diagnosis not present

## 2019-06-29 DIAGNOSIS — R4189 Other symptoms and signs involving cognitive functions and awareness: Secondary | ICD-10-CM | POA: Diagnosis not present

## 2019-06-29 LAB — TSH: TSH: 0.46 u[IU]/mL (ref 0.35–4.50)

## 2019-06-29 NOTE — Progress Notes (Signed)
[facility]  Patient Name: Daniel Stevenson MRN: [id number] Date of Birth: Jul 15, 1965 Age: 54 y.o. Education: 2 years  Clinical Impressions  Daniel Stevenson is a 54 y.o., right-hand dominant, married man with a history of LADA, headaches, difficult to control hypothyroidism, and depression/anxiety. He is complaining of memory and thinking problems since about September, 2020, involving what sounds like fairly benign day-to-day forgetfulness for the most part (I.e., getting side tracked, distractibility, laying things on the ground he intends to take in the house and forgetting them). He did also comment that he has a harder time doing things at work that he has done for many years, which is potentially concerning, but it wasn't clear if he is actually having a hard time doing them or it is diminished confidence. He stated that nobody else at work has noticed or commented on his problems unless he has brought them up.   On neuropsychological testing, [name] demonstrated unusually low single word reading, which suggests he may have a learning disability in conjunction with his report of special education. He performed toward the low end of the average range with respect to overall ability. His cognitive test data do not show a clear pattern of impairment per se; rather, the profile is viewed as most consistent with executive control problems with scattered low scores showing on measures of attention/working memory, processing efficiency, and with decreased memory encoding. He also had low scores on measures of processing speed that could be related but may also affect his learning difficulties. He had weak visuospatial and constructional functioning likely due to sloppy approach on figure copy, which may reflect his executive control problems. Nevertheless, he showed good retention of information across time at an aggregate level with an average score on the  Delayed Memory Index of the RBANS. He reported very high, severe levels of symptoms associated with depression and anxiety and presented as quite anxious and somewhat hyperkinetic on exam.   Overall impression is that [name] problems are on the basis of significant anxiety, depression, poor sleep, headaches, and interference from his underlying medical conditions (e.g., fluctuating blood glucose and/or thyroid control). There is no compelling indication of an incipient neurodegenerative or other "organic" cause of cognitive impairment at this time, but an MRI would be reasonable as further workup to make sure that nothing is being missed.   Diagnostic Impressions: Other symptoms and signs involving cognitive functions and awareness Depression with anxiety Learning disorder  Recommendations to be discussed with patient  Your performance and presentation on neuropsychological assessment were consistent with a learning disorder in the context of your clinical history. Apart from that, you had scattered low scores on measures of processing speed, memory encoding, and working memory, that can be best summed up as reflecting "executive control" problems.   Executive control is a higher order cognitive ability involved in regulating other cognitive resources. Much like the conductor of an orchestra coordinates multiple instruments to make music, executive capacities coordinate other lower-order skills (e.g., movement, language, attention) to form complex human behaviors. Individuals with executive control problems are often capable of doing most of the things they did before they were having problems, but they may not do so as effortlessly, efficiently, and consistently. These difficulties often manifest as problems tracking information, multitasking, and paying attention. Executive control problems often result in cognitive inefficiency and can present as "memory problems," because they decrease  encoding and spontaneous retrieval of information.   In your  case, I think that your executive control problems are multifactorial and can be significantly improved. For one, you reported very high levels of depression and anxiety. You are also not sleeping well. You may also have some legitimate cognitive dysfunction episodically related to suboptimal blood glucose control and thyroid control. You are also having headaches, which can cause pain and distraction. I think it is unlikely there is an underlying cause like a dementia syndrome for your cognitive issues.   I would encourage you to work actively with Dr. Marvel Plan on keeping as tight control of your blood glucose as is possible. It sounds like your diabetes and related symptoms from blood sugar instability are significantly contributing to your mood problems, because you feel bad about the things that you cannot do that you used to do and you are having a lot of fluctuation throughout the day.   You scored in the severe range for anxiety and depression symptoms. Anxiety and depression can greatly undermine cognitive performance both directly, though interfering with your ability to focus on the task at hand and indirectly; through affecting your own perception of how you are doing day-to-day. Many times, individuals with depression evaluate their performance more negatively than is warranted and I wonder if that isn't true in your case.   Effective treatments for anxiety and depression including psychotherapy (I.e., counseling) and medication. You could discuss medication options with Dr. Milinda Cave or, if desired, a psychiatrist who specializes in that sort of difficulty. We can discuss a referral for counseling. Counseling can also be helpful for learning behavioral strategies to help you sleep and making other positive lifestyle changes.   There are few things as disruptive to brain functioning as not getting a good night's sleep. For sleep, I  recommend against using medications, which can have lingering sedating effects on the brain and rob your brain of restful REM sleep. Instead, consider trying some of the following sleep hygiene recommendations. They may not work at once and may take effort, but the effort you spend is likely to be rewarded with better sleep eventually:  . Stick to a sleep schedule of the same bedtime and wake time even on the weekends, which can help to regulate your body's internal clock so that you fall asleep and stay asleep.  . Practice a relaxing bedtime ritual (conducted away from bright lights) which will help separate your sleep from stimulating activities and prepare your body to fall asleep when you go to bed.  . Avoid naps, especially in the afternoon.  . Evaluate your room and create conditions that will promote sleep such as keeping it cool (between 60 - 67 degrees), quiet, and free from any lights. Consider using blackout curtains, a "white noise" generator, or fan that will help mask any noises that might prevent you from going to sleep or awaken you during the night.  . Sleep on a comfortable mattress and pillows.  . Avoid bright light in the evening and excessive use of portable electronic devices right before bed that may contain light frequencies that can contribute to sleep problems.  . Avoid alcohol, cigarettes, or heavy meals in the evening. If you must eat, consume a light snack 45 minutes before bed.  . Use your bed only for sleep to strengthen the association between your bed and sleep.  . If you can't go to sleep within 30 minutes, go into another room and do something relaxing until you feel tired. Then, come back and try to go to  sleep again for 30 minutes and repeat until sleep is achieved.  . Some people find over the counter melatonin to be helpful for sleep, which you could discuss with a pharmacist or prescribing provider.   I do think it would be reasonable to obtain an MRI of the brain  to make sure nothing is being missed, because you haven't had neuroimaging and we can rule out vascular disease or other etiologies as contributors to your cognitive problems to make sure you are on the right track.   There are many simple lifestyle changes you can make that have been shown to be effective for headaches and some of them also promote brain health. Make sure you are getting sufficient amounts of sleep (around 8 hours per day). Drink plenty of fluids (64 ounces of clear fluids daily), especially water. Stay away from soda or fruit juices with high amounts of sugar. Get regular exercise, one hour every other day or one half hour every day. Cardiovascular exercise, in particular, may be good for brain health. Make sure to limit your daily intake of caffeine to no more than 200mg  per day (there is about 95mg  of caffeine in an 8oz cup of coffee). While over the counter medications can be helpful, all over the counter analgesics have the potential to cause medication overuse headaches. Avoid anything with caffeine, narcotics, or barbiturates because these can cause rebound headaches with very few doses. Taking over the counter analgesics more than 2 or 3 days per week can cause medication overuse headaches and interfere with preventative medications for an underlying primary headache disorder.   For headaches that are treatment refractory and which cannot be controlled by you and your primary care doctor, a referral to neurology could be considered. My colleague Dr. works extensively with headache patients and may have useful input.   Test Findings  Test scores are summarized in additional documentation associated with this encounter. Test scores are relative to age, gender, and educational history as available and appropriate. There were minor concerns about performance validity given one standalone and one embedded measure falling below the level of expectations. Nevertheless, Mr. Nichelson  performed within normal limits on two other standalone measures and two embedded measures, suggesting his test data are likely interpretable if not conservative estimates of his actual level of ability.   General Intellectual Functioning/Achievement:  Performance was unusually low on single word reading, which in conjunction with Mr. Tukes clinical history is suggestive of a possible learning disorder. He scored toward the low end of the average range on a measure of overall cognitive ability with comparable average range scores on both the visual and verbal portions.   Attention and Processing Efficiency: Performance was weak on measures of attention and working memory with an unusually low score overall on the Working Memory Index of the WAIS-IV. Digit repetition forward was average whereas digit resequencing in ascending order and digit repetition backwards were unusually low. Performance was low average when solving arithmetical word problems without paper and pencil.   With respect to processing speed, Mr. Hixon performed at the margin of the low average and unusually low ranges, which may be due to decreased cognitive efficiency but may also be partly due to his learning disorder (e.g., difficulties with symbolic processing). Performance was unusually low on two different measures of timed number symbol coding and on a symbol matching to sample task emphasizing efficient visual matching and efficient visual scanning.   Language: Language findings are likely grossly within  normal limits with normal range visual object confrontation naming and low average scores on semantic verbal fluency for fruits and vegetables and phonemic fluency in response to the letters F-A-S. By contrast, his fluency in response to the category prompt "animals" was unusually low.   Visuospatial Function: Visuospatial and constructional measures may have been undermined by executive control problems as Mr. Gayden  demonstrated a rushed and sloppy approach to copy of a modestly complex figure, generating an unusually low score. His judgment of angular line orientations was low average.   Learning and Memory: Learning and memory measures showed weak encoding of information in the setting of fairly good retention of information across time. He also had a dhard time with delayed recall of unstructured as compared to structured information. This is a pattern often seen when executive control problems are undermining memory performance.   In the visual realm, Mr. Mccort immediate recall for material including a 10-item word list and brief short story were unusually to extremely low. While his delayed recall for the word list was unusually low, his delayed yes/no recognition for the word list was average. His delayed recall for the short story was average.   In the visual realm, delayed recall for a modestly complex geometric figure was extremely low.   Executive Functions: Mr. Kerlin overall performance including sloppy approach to figure copy and his memory profile are suggestive of executive control difficulties. On dedicated measures within this domain, his performance was reasonable. Alternating sequencing of numbers and letters of the alphabet was low average. Generation of words in response to the letters F-A-S was low average. Performance was low average on the [employer] Composite of the Modified [employer]. Clock drawing was reasonable. He performed at an average level when reasoning with verbal material on the Complex Ideational Material.   Rating Scale(s): Mr. Effertz reported severe levels of symptoms associated with depression and anxiety, which is consistent with his presentation during the interview.   Bettye Boeck Roseanne Reno PsyD, ABN Clinical Neuropsychologist

## 2019-06-30 ENCOUNTER — Other Ambulatory Visit: Payer: Self-pay

## 2019-06-30 ENCOUNTER — Encounter: Payer: Self-pay | Admitting: Counselor

## 2019-06-30 DIAGNOSIS — R4189 Other symptoms and signs involving cognitive functions and awareness: Secondary | ICD-10-CM | POA: Insufficient documentation

## 2019-06-30 MED ORDER — NOVOLOG FLEXPEN 100 UNIT/ML ~~LOC~~ SOPN: PEN_INJECTOR | SUBCUTANEOUS | 1 refills | Status: DC

## 2019-06-30 NOTE — Progress Notes (Signed)
[facility]  I met with Daniel Stevenson to review the findings resulting from his neuropsychological evaluation. Since the last appointment, things have been about the same. He started Celexa with Dr. Ernestine Conrad, which was one of the recommendations deriving from his neuropsych evaluation. I took the time to obtain additional collateral from his wife who underscored the importance of his blood sugar and it's correlation with his cognitive symptoms. He has had a great deal of difficulty controlling his blood sugar despite changing his entire diet, being fastidious with his insulin, and the like. They are working with Dr. Ernestine Conrad and endocrinology. Time was spent reviewing the impressions and recommendations that are detailed in the evaluation report. Interventions provided during this encounter included psychoeducation, and other topics as reflected in the patient instructions. I took time to explain the findings and answer all the patient's questions. I encouraged Daniel Stevenson to contact me should he have any further questions or if further follow up is desired.   Current Medications and Medical History   Current Outpatient Medications  Medication Sig Dispense Refill  . amitriptyline (ELAVIL) 25 MG tablet TAKE ONE TABLET BY MOUTH EVERY MORNING AND TAKE THREE TABLETS at bedtime FOR right IN LEG PAIN 120 tablet 5  . atorvastatin (LIPITOR) 20 MG tablet Take 1 tablet (20 mg total) by mouth daily. 90 tablet 3  . citalopram (CELEXA) 20 MG tablet Take 1 tablet (20 mg total) by mouth daily. 30 tablet 0  . Continuous Blood Gluc Sensor (FREESTYLE LIBRE SENSOR SYSTEM) MISC USE 1 EACH BY DOES NOT APPLY ROUTE EVERY 10 DAYS.  99  . diclofenac Sodium (VOLTAREN) 1 % GEL Apply 2 g topically 4 (four) times daily. 100 g 3  . fluticasone (FLONASE) 50 MCG/ACT nasal spray Place 2 sprays into both nostrils daily. 16 g 3  . insulin aspart (NOVOLOG FLEXPEN) 100 UNIT/ML FlexPen 8 units qAC  15 mL 1  . Insulin Glargine (LANTUS SOLOSTAR) 100 UNIT/ML Solostar Pen Inject 16 Units into the skin at bedtime.    . Insulin Pen Needle 32G X 4 MM MISC (BD ULTRA-FINE NEEDLES) USE 4 TIMES DAILY AS NEEDED. DX:E11.65 100 each 11  . lisinopril (ZESTRIL) 5 MG tablet Take 5 mg by mouth daily.    . pantoprazole (PROTONIX) 40 MG tablet Take 1 tablet (40 mg total) by mouth daily. 30 tablet 3  . SYNTHROID 137 MCG tablet Take 1 tablet (137 mcg total) by mouth daily before breakfast. 30 tablet 0   No current facility-administered medications for this visit.   Patient Active Problem List   Diagnosis Date Noted  . Lymphadenitis, acute 05/02/2015  . Headache 01/10/2015  . Fatigue 01/10/2015  . Allergic rhinitis 01/10/2015  . Medication intolerance 09/06/2013  . Health maintenance examination 07/13/2013  . Type II or unspecified type diabetes mellitus without mention of complication, uncontrolled 01/18/2013  . GERD (gastroesophageal reflux disease) 01/18/2013  . Right leg pain 08/18/2012  . Ganglion cyst of flexor tendon sheath 08/05/2011  . History of low back pain 06/24/2011  . Tobacco dependence 11/20/2010  . Hypothyroidism 10/09/2009    Mental Status and Behavioral Observations  Daniel Stevenson was available at the prespecified time for this telephonic encounter. He was alert and generally oriented (orientation not formally assessed). His speech was somewhat fast in rate but normal in volume and prosody without word finding errors. His self-reported mood was "depressed" and his affect as assessed by vocal quality was a bit anxious, as at the  initial encounter. His thought process was logical, linear, and goal oriented. Content was appropriate. There were no safety concerns, such as thoughts of harming self or others.   Plan  Feedback provided regarding the patient's neuropsychological evaluation. He and his wife presented as very grateful for the information provided and are happy that his testing  does not look concerning for dementia at this time, given all his other health issues. I greatly reinforced his attempts to control his blood sugar. Daniel Stevenson was encouraged to contact me if any questions arise or if further follow up is desired.   Viviano Simas Nicole Kindred, PsyD, ABN Clinical Neuropsychologist  Service(s) Provided at This Encounter: 50 minutes [phone]; Conjoint therapy with patient present)

## 2019-06-30 NOTE — Patient Instructions (Signed)
Your performance and presentation on neuropsychological assessment were consistent with a learning disorder in the context of your clinical history. Apart from that, you had scattered low scores on measures of processing speed, memory encoding, and working memory, that can be best summed up as reflecting "executive control" problems.   Executive control is a higher order cognitive ability involved in regulating other cognitive resources. Much like the conductor of an orchestra coordinates multiple instruments to make music, executive capacities coordinate other lower-order skills (e.g., movement, language, attention) to form complex human behaviors. Individuals with executive control problems are often capable of doing most of the things they did before they were having problems, but they may not do so as effortlessly, efficiently, and consistently. These difficulties often manifest as problems tracking information, multitasking, and paying attention. Executive control problems often result in cognitive inefficiency and can present as "memory problems," because they decrease encoding and spontaneous retrieval of information.   In your case, I think that your executive control problems are multifactorial and can be significantly improved. For one, you reported very high levels of depression and anxiety. You are also not sleeping well. You may also have some legitimate cognitive dysfunction episodically related to suboptimal blood glucose control and thyroid control. You are also having headaches, which can cause pain and distraction. I think it is unlikely there is an underlying cause like a dementia syndrome for your cognitive issues.   I would encourage you to work actively with Dr. Marvel Plan on keeping as tight control of your blood glucose as is possible. It sounds like your diabetes and related symptoms from blood sugar instability are significantly contributing to your mood problems, because you feel bad  about the things that you cannot do that you used to do and you are having a lot of fluctuation throughout the day.   You scored in the severe range for anxiety and depression symptoms. Anxiety and depression can greatly undermine cognitive performance both directly, though interfering with your ability to focus on the task at hand and indirectly; through affecting your own perception of how you are doing day-to-day. Many times, individuals with depression evaluate their performance more negatively than is warranted and I wonder if that isn't true in your case.   Effective treatments for anxiety and depression including psychotherapy (I.e., counseling) and medication. You already started Celexa under Dr. Marvel Plan, which is good. We discussed the importance of giving that medication a good trial and taking it reliably. We also discussed a referral for counseling, which you considered, but did not want to go through with. That is reasonable given that you are trying medication first.   There are few things as disruptive to brain functioning as not getting a good night's sleep. For sleep, I recommend against using medications, which can have lingering sedating effects on the brain and rob your brain of restful REM sleep. Instead, consider trying some of the following sleep hygiene recommendations. They may not work at once and may take effort, but the effort you spend is likely to be rewarded with better sleep eventually:   Stick to a sleep schedule of the same bedtime and wake time even on the weekends, which can help to regulate your body's internal clock so that you fall asleep and stay asleep.   Practice a relaxing bedtime ritual (conducted away from bright lights) which will help separate your sleep from stimulating activities and prepare your body to fall asleep when you go to bed.   Avoid naps,  especially in the afternoon.   Evaluate your room and create conditions that will promote sleep such as  keeping it cool (between 60 - 67 degrees), quiet, and free from any lights. Consider using blackout curtains, a "white noise" generator, or fan that will help mask any noises that might prevent you from going to sleep or awaken you during the night.   Sleep on a comfortable mattress and pillows.   Avoid bright light in the evening and excessive use of portable electronic devices right before bed that may contain light frequencies that can contribute to sleep problems.   Avoid alcohol, cigarettes, or heavy meals in the evening. If you must eat, consume a light snack 45 minutes before bed.   Use your bed only for sleep to strengthen the association between your bed and sleep.   If you can't go to sleep within 30 minutes, go into another room and do something relaxing until you feel tired. Then, come back and try to go to sleep again for 30 minutes and repeat until sleep is achieved.   Some people find over the counter melatonin to be helpful for sleep, which you could discuss with a pharmacist or prescribing provider.   I do think it would be reasonable to obtain an MRI of the brain to make sure nothing is being missed, because you haven't had neuroimaging and we can rule out vascular disease or other etiologies as contributors to your cognitive problems to make sure you are on the right track.   There are many simple lifestyle changes you can make that have been shown to be effective for headaches and some of them also promote brain health. Make sure you are getting sufficient amounts of sleep (around 8 hours per day). Drink plenty of fluids (64 ounces of clear fluids daily), especially water. Stay away from soda or fruit juices with high amounts of sugar. Get regular exercise, one hour every other day or one half hour every day. Cardiovascular exercise, in particular, may be good for brain health. Make sure to limit your daily intake of caffeine to no more than 200mg  per day (there is about 95mg  of  caffeine in an 8oz cup of coffee). While over the counter medications can be helpful, all over the counter analgesics have the potential to cause medication overuse headaches. Avoid anything with caffeine, narcotics, or barbiturates because these can cause rebound headaches with very few doses. Taking over the counter analgesics more than 2 or 3 days per week can cause medication overuse headaches and interfere with preventative medications for an underlying primary headache disorder.   For headaches that are treatment refractory and which cannot be controlled by you and your primary care doctor, a referral to neurology could be considered. My colleague Dr. Tomi Likens works extensively with headache patients and may have useful input.

## 2019-07-10 ENCOUNTER — Other Ambulatory Visit: Payer: Self-pay | Admitting: Family Medicine

## 2019-07-15 ENCOUNTER — Other Ambulatory Visit: Payer: Self-pay

## 2019-07-15 ENCOUNTER — Ambulatory Visit: Payer: BC Managed Care – PPO | Admitting: Family Medicine

## 2019-07-15 ENCOUNTER — Encounter: Payer: Self-pay | Admitting: Family Medicine

## 2019-07-15 VITALS — BP 132/83 | HR 91 | Temp 98.4°F | Resp 16 | Ht 65.0 in | Wt 143.8 lb

## 2019-07-15 DIAGNOSIS — R058 Other specified cough: Secondary | ICD-10-CM

## 2019-07-15 DIAGNOSIS — F32A Depression, unspecified: Secondary | ICD-10-CM

## 2019-07-15 DIAGNOSIS — R05 Cough: Secondary | ICD-10-CM | POA: Diagnosis not present

## 2019-07-15 DIAGNOSIS — T887XXA Unspecified adverse effect of drug or medicament, initial encounter: Secondary | ICD-10-CM

## 2019-07-15 DIAGNOSIS — F5104 Psychophysiologic insomnia: Secondary | ICD-10-CM | POA: Diagnosis not present

## 2019-07-15 DIAGNOSIS — R809 Proteinuria, unspecified: Secondary | ICD-10-CM

## 2019-07-15 DIAGNOSIS — E1129 Type 2 diabetes mellitus with other diabetic kidney complication: Secondary | ICD-10-CM

## 2019-07-15 DIAGNOSIS — F419 Anxiety disorder, unspecified: Secondary | ICD-10-CM

## 2019-07-15 DIAGNOSIS — K219 Gastro-esophageal reflux disease without esophagitis: Secondary | ICD-10-CM

## 2019-07-15 DIAGNOSIS — F329 Major depressive disorder, single episode, unspecified: Secondary | ICD-10-CM

## 2019-07-15 MED ORDER — LOSARTAN POTASSIUM 50 MG PO TABS: 50.0000 mg | ORAL_TABLET | Freq: Every day | ORAL | 6 refills | Status: DC

## 2019-07-15 MED ORDER — AMITRIPTYLINE HCL 25 MG PO TABS: ORAL_TABLET | ORAL | 5 refills | Status: DC

## 2019-07-15 MED ORDER — TAMSULOSIN HCL 0.4 MG PO CAPS: 0.4000 mg | ORAL_CAPSULE | Freq: Every day | ORAL | 0 refills | Status: DC

## 2019-07-15 NOTE — Progress Notes (Signed)
OFFICE VISIT  07/15/2019   CC:  Chief Complaint  Patient presents with  . Discuss alternative medication    he was started on citalopram about 2 weeks ago and feels it is not working or as effective   HPI:    Patient is a 54 y.o. Caucasian male who presents accompanied by his wife for "medication not working, wants to speak about alternatives". I last saw him 2 wks ago: A/P as of last visit: "1) MDD with significant anxiety as well.  Mild/mod. Start trial of citalopram 20 mg qd.  Therapeutic expectations and side effect profile of medication discussed today.  Patient's questions answered. I believe his memory impairment is secondary to his mood/anxiety d/o. He'll have f/u with psych/neurocog testing results reviewed TOMORROW.  2) GERD: pt assoc the onset of this after starting his ACE-I.  Low likelihood of GERD side effect from this med but possible.  He'll do trial of his lisinopril and see if GERD much improved.  If so, he'll monitor bp as well as call our office and we'll change to trial of ARB for his proteinuria.  If not improved off lisinopril then restart it and start taking daily PPI that I rx'd today (pantoprazole 40mg  qd).  3)) DM (LADA): stable per home gluc monitoring.  Hx of microalbuminuria. HbA1c, urine microalb/cr today. Lytes/cr today.    4) HLD: tolerating statin.  Not fasting today.  Last LDL was 45 six mo ago. Hepatic panel today. Rpt lipids 6 mo.  5) Hypothyroidism: monitor TSH today and see how lower dose of T4 did."  INTERIM HX: Goal A1c 6.5%, but A1c last check was 7%, so I increased lantus to 16 U qhs and increase novolog to 8 units with each meal. Glucoses: last 2 wks up and down, from 80 up to 200.  CGM avg fasting 145, 2H PP BF and lunch 140s, 2H PP supper and hs are up to 180s (occ up to 250s).  Main issue is insomnia still: bed at 10, takes 84min to initiate sleep, up at 12, up and down all night--sometimes not back to sleep w/in 45 min.  Wakes up  to urinate frequently, some urinary hesitancy and incomplete emptying.  No daytime urinary frequency or urgency.  Has dry cough, tickle, sometimes bothers sleep--ever since getting on ACE-I.  NO RLS sx's.  Mind is racing with thoughts, anxieties about next day's work, prev days shortcomings, bills, etc. This makes him irritable, affects day to day functioning.   He does not limit his fluids after supper.    No time was tried off kidney med, he started daily PPI and says GERD much improved.  ROS: no fevers, no CP, no SOB, no wheezing,no dizziness, no HAs, no rashes, no melena/hematochezia.  No polyuria or polydipsia.  No myalgias or arthralgias.  No focal weakness, paresthesias, or tremors.  No acute vision or hearing abnormalities. No n/v/d or abd pain.  No palpitations.     Past Medical History:  Diagnosis Date  . Back pain    s/p back surgery  . Chronic hyponatremia 08/2012 onset   Onset was right after starting amitriptyline for his neuropathic leg pain--has been stable.  Likely ADH-like effect on kidney from amitriptyline.  Marland Kitchen History of low back pain 06/24/2011  . Hyperlipidemia   . Hypothyroidism   . 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 Steffanie Dunn.  . Migraines   . Neuropathic pain of right lower extremity   .  PLANTAR WART, RIGHT 10/09/2009  . Snoring    Sleep study neg of OSA 04/2018  . Tobacco dependence    quit 2012; restarted not long after    Past Surgical History:  Procedure Laterality Date  . BACK SURGERY  2009   lower lumbar fusion- rubber disc in back  . CARDIOVASCULAR STRESS TEST  03/21/14   No ischemia/low risk study; however, LV function showed EF 49% and global LV hypokinesis--echo recommended and it showed normal LV fxn.  Marland Kitchen NASAL SINUS SURGERY  2007  . TRANSTHORACIC ECHOCARDIOGRAM  03/24/14   Normal LV fxn, EF 50-55%, no wall motion abnormalities: (grade 1 diast dysfxn)    Outpatient Medications Prior to Visit   Medication Sig Dispense Refill  . amitriptyline (ELAVIL) 25 MG tablet TAKE ONE TABLET BY MOUTH EVERY MORNING AND TAKE THREE TABLETS at bedtime FOR right IN LEG PAIN 120 tablet 5  . atorvastatin (LIPITOR) 20 MG tablet Take 1 tablet (20 mg total) by mouth daily. 90 tablet 3  . Continuous Blood Gluc Sensor (FREESTYLE LIBRE SENSOR SYSTEM) MISC USE 1 EACH BY DOES NOT APPLY ROUTE EVERY 10 DAYS.  99  . diclofenac Sodium (VOLTAREN) 1 % GEL Apply 2 g topically 4 (four) times daily. 100 g 3  . fluticasone (FLONASE) 50 MCG/ACT nasal spray Place 2 sprays into both nostrils daily. 16 g 3  . insulin aspart (NOVOLOG FLEXPEN) 100 UNIT/ML FlexPen 8 units qAC 15 mL 1  . Insulin Glargine (LANTUS SOLOSTAR) 100 UNIT/ML Solostar Pen Inject 16 Units into the skin at bedtime.    . Insulin Pen Needle 32G X 4 MM MISC (BD ULTRA-FINE NEEDLES) USE 4 TIMES DAILY AS NEEDED. DX:E11.65 100 each 11  . pantoprazole (PROTONIX) 40 MG tablet Take 1 tablet (40 mg total) by mouth daily. 30 tablet 3  . SYNTHROID 137 MCG tablet Take 1 tablet (137 mcg total) by mouth daily before breakfast. 30 tablet 0  . lisinopril (ZESTRIL) 5 MG tablet Take 5 mg by mouth daily.    . citalopram (CELEXA) 20 MG tablet Take 1 tablet (20 mg total) by mouth daily. (Patient not taking: Reported on 07/15/2019) 30 tablet 0   No facility-administered medications prior to visit.    Allergies  Allergen Reactions  . Augmentin [Amoxicillin-Pot Clavulanate] Nausea And Vomiting  . Oxycodone-Acetaminophen     REACTION: itching  . Levothyroxine Rash    Had rash associated with generic levothyroxine, but not brand-name Synthroid.  Likely allergic to a dye or filler in the generic preparation.  . Lisinopril Cough    ROS As per HPI  PE: Vitals with BMI 07/15/2019 06/28/2019 05/07/2019  Height 5\' 5"  5\' 5"  5\' 5"   Weight 143 lbs 13 oz 148 lbs 10 oz 148 lbs  BMI 23.93 24.73 24.63  Systolic 132 124  Diastolic 83 74 92  Pulse 91 80 87    Gen: Alert, well  appearing.  Patient is oriented to person, place, time, and situation. AFFECT: pleasant, lucid thought and speech. No further exam today.  LABS:  Lab Results  Component Value Date   TSH 0.46 06/28/2019   Lab Results  Component Value Date   WBC 9.8 09/15/2018   HGB 13.9 09/15/2018   HCT 39.8 09/15/2018   MCV 88.2 09/15/2018   PLT 280 09/15/2018   Lab Results  Component Value Date   CREATININE 0.87 06/28/2019   BUN 10 06/28/2019   NA 126 (L) 06/28/2019   K 4.2 06/28/2019   CL 93 (L)  06/28/2019   CO2 26 06/28/2019   Lab Results  Component Value Date   ALT 12 06/28/2019   AST 15 06/28/2019   ALKPHOS 128 (H) 06/28/2019   BILITOT 0.5 06/28/2019   Lab Results  Component Value Date   CHOL 113 12/24/2018   Lab Results  Component Value Date   HDL 57.00 12/24/2018   Lab Results  Component Value Date   LDLCALC 45 12/24/2018   Lab Results  Component Value Date   TRIG 54.0 12/24/2018   Lab Results  Component Value Date   CHOLHDL 2 12/24/2018   Lab Results  Component Value Date   PSA 0.23 12/24/2018   PSA 0.23 11/18/2017   PSA 0.26 11/14/2016   Lab Results  Component Value Date   HGBA1C 7.0 (H) 06/28/2019    IMPRESSION AND PLAN:  1) Insomnia: multifactorial: ACE-I cough, nocturia, anx/dep issues. Stop ACE-I, start losartan 50mg  qd.   Start flomax 0.4mg  qhs. Continue citalopram and give this more time (has been on this only 2 wks). If not improving signif with these changes then consider lowering T4 dose some with goal TSH of around 4.  2) Memory dysfunction: neurocog testing confirmed my suspicion that this was all due to anx/dep: tolerating citalopram.  Re-eval this prob at 2 wk f/u.  No change yet.  3) GERD: much improved with daily PPI.  Continue this +dietary mod.  4) Diabetes: glucoses not ideal, particularly around supper and bedtime. Fastings not ideal but will leave lantus dosing alone for now. Discussed varying lunch novolog dose from 7-9 U and  supper novolog dosing from 8-10 U depending on what he'll be eating.  Keep BF novolog at 7 U.  An After Visit Summary was printed and given to the patient.  FOLLOW UP: Return for keep appt scheduled for 07/29/19.  Signed:  07/31/19, MD           07/15/2019

## 2019-07-29 ENCOUNTER — Ambulatory Visit: Payer: BC Managed Care – PPO | Admitting: Family Medicine

## 2019-07-29 ENCOUNTER — Encounter: Payer: Self-pay | Admitting: Family Medicine

## 2019-07-29 ENCOUNTER — Other Ambulatory Visit: Payer: Self-pay

## 2019-07-29 VITALS — BP 139/84 | HR 91 | Temp 97.8°F | Resp 16 | Ht 65.0 in | Wt 148.4 lb

## 2019-07-29 DIAGNOSIS — N401 Enlarged prostate with lower urinary tract symptoms: Secondary | ICD-10-CM

## 2019-07-29 DIAGNOSIS — I10 Essential (primary) hypertension: Secondary | ICD-10-CM

## 2019-07-29 DIAGNOSIS — N138 Other obstructive and reflux uropathy: Secondary | ICD-10-CM

## 2019-07-29 DIAGNOSIS — T50905D Adverse effect of unspecified drugs, medicaments and biological substances, subsequent encounter: Secondary | ICD-10-CM

## 2019-07-29 DIAGNOSIS — F320 Major depressive disorder, single episode, mild: Secondary | ICD-10-CM

## 2019-07-29 DIAGNOSIS — R413 Other amnesia: Secondary | ICD-10-CM

## 2019-07-29 MED ORDER — CITALOPRAM HYDROBROMIDE 40 MG PO TABS: 40.0000 mg | ORAL_TABLET | Freq: Every day | ORAL | 1 refills | Status: DC

## 2019-07-29 MED ORDER — LOSARTAN POTASSIUM 100 MG PO TABS
100.0000 mg | ORAL_TABLET | Freq: Every day | ORAL | 1 refills | Status: DC
Start: 2019-07-29 — End: 2019-08-26

## 2019-07-29 NOTE — Progress Notes (Signed)
OFFICE VISIT  07/29/2019   CC:  Chief Complaint  Patient presents with  . Follow-up    insomnia/memory dysfunction    HPI:    Patient is a 54 y.o. Caucasian male who presents for 2 wk f/u mood/anxiety, insomnia, memory dysfunction. A/P as of last visit: "1) Insomnia: multifactorial: ACE-I cough, nocturia, anx/dep issues. Stop ACE-I, start losartan 50mg  qd.   Start flomax 0.4mg  qhs. Continue citalopram and give this more time (has been on this only 2 wks). If not improving signif with these changes then consider lowering T4 dose some with goal TSH of around 4.  2) Memory dysfunction: neurocog testing confirmed my suspicion that this was all due to anx/dep: tolerating citalopram.  Re-eval this prob at 2 wk f/u.  No change yet.  3) GERD: much improved with daily PPI.  Continue this +dietary mod.  4) Diabetes: glucoses not ideal, particularly around supper and bedtime. Fastings not ideal but will leave lantus dosing alone for now. Discussed varying lunch novolog dose from 7-9 U and supper novolog dosing from 8-10 U depending on what he'll be eating.  Keep BF novolog at 7 U.  INTERIM HX: He is noting a bit of difference in his attitude, how he focuses on things, less depressed mood and hopelessness, coping better.  Sleep improving some. Less anhedonia. Memory/cognitive functioning clearing up some.  He is pleased with his improvement at this point but is open to increasing his citalopram dose.  Flomax is helping for his nocturia (twice per night now).  No side effects from this med.  No home bp monitoring since change in bp med last visit. Cough is signif improved.  He does still smoke.  ROS: no fevers, no CP, no SOB, no wheezing,no dizziness, no HAs, no rashes, no melena/hematochezia.  No polyuria or polydipsia.  No myalgias or arthralgias.  No focal weakness, paresthesias, or tremors.  No acute vision or hearing abnormalities. No n/v/d or abd pain.  No palpitations.    Past  Medical History:  Diagnosis Date  . Back pain    s/p back surgery  . Chronic hyponatremia 08/2012 onset   Onset was right after starting amitriptyline for his neuropathic leg pain--has been stable.  Likely ADH-like effect on kidney from amitriptyline.  09/2012 History of low back pain 06/24/2011  . Hyperlipidemia   . Hypothyroidism   . LADA (latent autoimmune diabetes in adults), managed as type 1 ([facility]) Dx'd 2014   Dr. 2015.  . Migraines   . Neuropathic pain of right lower extremity   . PLANTAR WART, RIGHT 10/09/2009  . Snoring    Sleep study neg of OSA 04/2018  . Tobacco dependence    quit 2012; restarted not long after    Past Surgical History:  Procedure Laterality Date  . BACK SURGERY  2009   lower lumbar fusion- rubber disc in back  . CARDIOVASCULAR STRESS TEST  03/21/14   No ischemia/low risk study; however, LV function showed EF 49% and global LV hypokinesis--echo recommended and it showed normal LV fxn.  05/20/14 NASAL SINUS SURGERY  2007  . TRANSTHORACIC ECHOCARDIOGRAM  03/24/14   Normal LV fxn, EF 50-55%, no wall motion abnormalities: (grade 1 diast dysfxn)    Outpatient Medications Prior to Visit  Medication Sig Dispense Refill  . amitriptyline (ELAVIL) 25 MG tablet TAKE ONE TABLET BY MOUTH EVERY MORNING AND TAKE THREE TABLETS at bedtime FOR right IN LEG PAIN 120 tablet 5  . atorvastatin (LIPITOR) 20 MG tablet Take 1  tablet (20 mg total) by mouth daily. 90 tablet 3  . Continuous Blood Gluc Sensor (FREESTYLE LIBRE SENSOR SYSTEM) MISC USE 1 EACH BY DOES NOT APPLY ROUTE EVERY 10 DAYS.  99  . diclofenac Sodium (VOLTAREN) 1 % GEL Apply 2 g topically 4 (four) times daily. 100 g 3  . fluticasone (FLONASE) 50 MCG/ACT nasal spray Place 2 sprays into both nostrils daily. 16 g 3  . insulin aspart (NOVOLOG FLEXPEN) 100 UNIT/ML FlexPen 8 units qAC 15 mL 1  . Insulin Glargine (LANTUS SOLOSTAR) 100 UNIT/ML Solostar Pen Inject 16 Units into the skin at bedtime.    . Insulin Pen Needle 32G X 4 MM  MISC (BD ULTRA-FINE NEEDLES) USE 4 TIMES DAILY AS NEEDED. DX:E11.65 100 each 11  . pantoprazole (PROTONIX) 40 MG tablet Take 1 tablet (40 mg total) by mouth daily. 30 tablet 3  . SYNTHROID 137 MCG tablet Take 1 tablet (137 mcg total) by mouth daily before breakfast. 30 tablet 0  . tamsulosin (FLOMAX) 0.4 MG CAPS capsule Take 1 capsule (0.4 mg total) by mouth daily after supper. 30 capsule 0  . citalopram (CELEXA) 20 MG tablet Take 1 tablet (20 mg total) by mouth daily. 30 tablet 0  . losartan (COZAAR) 50 MG tablet Take 1 tablet (50 mg total) by mouth daily. 30 tablet 6   No facility-administered medications prior to visit.    Allergies  Allergen Reactions  . Augmentin [Amoxicillin-Pot Clavulanate] Nausea And Vomiting  . Oxycodone-Acetaminophen     REACTION: itching  . Levothyroxine Rash    Had rash associated with generic levothyroxine, but not brand-name Synthroid.  Likely allergic to a dye or filler in the generic preparation.  . Lisinopril Cough    ROS As per HPI  PE: Vitals with BMI 07/29/2019 07/15/2019 06/28/2019  Height 5\' 5"  5\' 5"  5\' 5"   Weight 148 lbs 6 oz 143 lbs 13 oz 148 lbs 10 oz  BMI 24.7 71.69 67.89  Systolic 381 017 510  Diastolic 84 83 74  Pulse 91 91 80    Gen: Alert, well appearing.  Patient is oriented to person, place, time, and situation. AFFECT: pleasant, lucid thought and speech. CV: RRR, no m/r/g.   LUNGS: CTA bilat, nonlabored resps, good aeration in all lung fields. EXT: no clubbing or cyanosis.  no edema.    LABS:    Chemistry      Component Value Date/Time   NA 126 (L) 06/28/2019 1153   NA 123 (A) 09/15/2018 0000   K 4.2 06/28/2019 1153   CL 93 (L) 06/28/2019 1153   CO2 26 06/28/2019 1153   BUN 10 06/28/2019 1153   CREATININE 0.87 06/28/2019 1153   CREATININE 0.71 06/27/2011 1516   GLU 231 09/15/2018 0000      Component Value Date/Time   CALCIUM 8.5 06/28/2019 1153   ALKPHOS 128 (H) 06/28/2019 1153   AST 15 06/28/2019 1153   ALT 12  06/28/2019 1153   BILITOT 0.5 06/28/2019 1153     Lab Results  Component Value Date   TSH 0.46 06/28/2019   Lab Results  Component Value Date   HGBA1C 7.0 (H) 06/28/2019    IMPRESSION AND PLAN:  1) MDD, leading to mild cognitive impairment and memory impairment (confirmed by neurocog testing).  Improved on 1 mo of citalopram 20mg  qd. Increase citalopram to 40mg  qd.  2) BPH: improved nocturia and this is helping him get more consistent sleep and he feels more rested.  Continue flomax 0.4mg  qhs.  3) Uncontrolled HTN: just mild elevation, without any home bp checks for comparison.  Increase losartan to 100 mg qd. Encouraged pt to monitor bp/hr at home at least a few days a week, write them down and we'll review at next f/u in 1 mo.  4) Cough: seemed to be at least partially due to ACE-I side effect. Remainder suspected to be from ongoing cig smoking.  An After Visit Summary was printed and given to the patient.  FOLLOW UP: Return in about 4 weeks (around 08/26/2019) for f/u mood/anx/HTN.  Signed:  Santiago Bumpers, MD           07/29/2019

## 2019-08-09 ENCOUNTER — Other Ambulatory Visit: Payer: Self-pay | Admitting: Family Medicine

## 2019-08-23 ENCOUNTER — Other Ambulatory Visit: Payer: Self-pay | Admitting: Family Medicine

## 2019-08-23 NOTE — Telephone Encounter (Signed)
RX's just picked up 08/23/19.  Patient has OV 08/26/19.

## 2019-08-26 ENCOUNTER — Ambulatory Visit: Payer: BC Managed Care – PPO | Admitting: Family Medicine

## 2019-08-26 ENCOUNTER — Encounter: Payer: Self-pay | Admitting: Family Medicine

## 2019-08-26 ENCOUNTER — Other Ambulatory Visit: Payer: Self-pay

## 2019-08-26 VITALS — BP 117/67 | HR 97 | Temp 98.7°F | Resp 16 | Ht 65.0 in | Wt 147.4 lb

## 2019-08-26 DIAGNOSIS — F324 Major depressive disorder, single episode, in partial remission: Secondary | ICD-10-CM | POA: Diagnosis not present

## 2019-08-26 DIAGNOSIS — I10 Essential (primary) hypertension: Secondary | ICD-10-CM | POA: Diagnosis not present

## 2019-08-26 MED ORDER — LOSARTAN POTASSIUM 100 MG PO TABS: 100.0000 mg | ORAL_TABLET | Freq: Every day | ORAL | 3 refills | Status: DC

## 2019-08-26 MED ORDER — CITALOPRAM HYDROBROMIDE 40 MG PO TABS: 40.0000 mg | ORAL_TABLET | Freq: Every day | ORAL | 3 refills | Status: DC

## 2019-08-26 MED ORDER — TAMSULOSIN HCL 0.4 MG PO CAPS: 0.4000 mg | ORAL_CAPSULE | Freq: Every day | ORAL | 3 refills | Status: DC

## 2019-08-26 NOTE — Addendum Note (Signed)
Addended by: Daryel November L on: 08/26/2019 03:42 PM   Modules accepted: Orders

## 2019-08-26 NOTE — Progress Notes (Signed)
OFFICE VISIT  08/26/2019   CC:  Chief Complaint  Patient presents with  . Follow-up    4 weeks, mood/anxiety, HTN     HPI:    Patient is a 54 y.o. Caucasian male who presents for 1 mo f/u MDD and uncontrolled HTN. A/P as of last visit: "1) MDD, leading to mild cognitive impairment and memory impairment (confirmed by neurocog testing).  Improved on 1 mo of citalopram 20mg  qd. Increase citalopram to 40mg  qd.  2) BPH: improved nocturia and this is helping him get more consistent sleep and he feels more rested.  Continue flomax 0.4mg  qhs.  3) Uncontrolled HTN: just mild elevation, without any home bp checks for comparison.  Increase losartan to 100 mg qd. Encouraged pt to monitor bp/hr at home at least a few days a week, write them down and we'll review at next f/u in 1 mo.  4) Cough: seemed to be at least partially due to ACE-I side effect. Remainder suspected to be from ongoing cig smoking."  INTERIM HX: Feeling 60% better the last couple months. Not as anxious, less anhedonia and sadness, memory better, task completion better, sleep better. Wt is stable. BPs consistently <130/80 consistently. No side effects from meds.  Says he feels like glucoses more steadily normal w/out big excursions.    ROS: no fevers, no CP, no SOB, no wheezing, no cough, no dizziness, no HAs, no rashes, no melena/hematochezia.  No polyuria or polydipsia.  No myalgias or arthralgias.  No focal weakness, paresthesias, or tremors.  No acute vision or hearing abnormalities. No n/v/d or abd pain.  No palpitations.    Past Medical History:  Diagnosis Date  . Back pain    s/p back surgery  . Chronic hyponatremia 08/2012 onset   Onset was right after starting amitriptyline for his neuropathic leg pain--has been stable.  Likely ADH-like effect on kidney from amitriptyline.  History of low back pain 06/24/2011  . Hyperlipidemia   . Hypothyroidism   . LADA (latent autoimmune diabetes in adults), managed  as type 1 (HCC) Dx'd 2014   Dr. 06/26/2011.  . Migraines   . Neuropathic pain of right lower extremity   . PLANTAR WART, RIGHT 10/09/2009  . Snoring    Sleep study neg of OSA 04/2018  . Tobacco dependence    quit 2012; restarted not long after    Past Surgical History:  Procedure Laterality Date  . BACK SURGERY  2009   lower lumbar fusion- rubber disc in back  . CARDIOVASCULAR STRESS TEST  03/21/14   No ischemia/low risk study; however, LV function showed EF 49% and global LV hypokinesis--echo recommended and it showed normal LV fxn.  2010 NASAL SINUS SURGERY  2007  . TRANSTHORACIC ECHOCARDIOGRAM  03/24/14   Normal LV fxn, EF 50-55%, no wall motion abnormalities: (grade 1 diast dysfxn)    Outpatient Medications Prior to Visit  Medication Sig Dispense Refill  . amitriptyline (ELAVIL) 25 MG tablet TAKE ONE TABLET BY MOUTH EVERY MORNING AND TAKE THREE TABLETS at bedtime FOR right IN LEG PAIN 120 tablet 5  . atorvastatin (LIPITOR) 20 MG tablet Take 1 tablet (20 mg total) by mouth daily. 90 tablet 3  . citalopram (CELEXA) 40 MG tablet Take 1 tablet (40 mg total) by mouth daily. 30 tablet 1  . Continuous Blood Gluc Sensor (FREESTYLE LIBRE SENSOR SYSTEM) MISC USE 1 EACH BY DOES NOT APPLY ROUTE EVERY 10 DAYS.  99  . fluticasone (FLONASE) 50 MCG/ACT nasal spray Place  2 sprays into both nostrils daily. 16 g 3  . insulin aspart (NOVOLOG FLEXPEN) 100 UNIT/ML FlexPen 8 units qAC 15 mL 1  . Insulin Glargine (LANTUS SOLOSTAR) 100 UNIT/ML Solostar Pen Inject 16 Units into the skin at bedtime.    . Insulin Pen Needle 32G X 4 MM MISC (BD ULTRA-FINE NEEDLES) USE 4 TIMES DAILY AS NEEDED. DX:E11.65 100 each 11  . losartan (COZAAR) 100 MG tablet Take 1 tablet (100 mg total) by mouth daily. 30 tablet 1  . pantoprazole (PROTONIX) 40 MG tablet Take 1 tablet (40 mg total) by mouth daily. 30 tablet 3  . SYNTHROID 137 MCG tablet TAKE ONE TABLET BY MOUTH EVERY DAY BEFORE BREAKFAST 30 tablet 3  . tamsulosin (FLOMAX) 0.4  MG CAPS capsule Take 1 capsule (0.4 mg total) by mouth daily after supper. 30 capsule 0  . diclofenac Sodium (VOLTAREN) 1 % GEL Apply 2 g topically 4 (four) times daily. (Patient not taking: Reported on 08/26/2019) 100 g 3   No facility-administered medications prior to visit.    Allergies  Allergen Reactions  . Augmentin [Amoxicillin-Pot Clavulanate] Nausea And Vomiting  . Oxycodone-Acetaminophen     REACTION: itching  . Levothyroxine Rash    Had rash associated with generic levothyroxine, but not brand-name Synthroid.  Likely allergic to a dye or filler in the generic preparation.  . Lisinopril Cough    ROS As per HPI  PE: Blood pressure 117/67, pulse 97, temperature 98.7 F (37.1 C), temperature source Temporal, resp. rate 16, height 5\' 5"  (1.651 m), weight 147 lb 6.4 oz (66.9 kg), SpO2 99 %. Gen: Alert, well appearing.  Patient is oriented to person, place, time, and situation. AFFECT: pleasant, lucid thought and speech. CV: RRR, no m/r/g.   LUNGS: CTA bilat, nonlabored resps, good aeration in all lung fields. EXT: no clubbing or cyanosis.  no edema.    LABS:  Lab Results  Component Value Date   TSH 0.46 06/28/2019   Lab Results  Component Value Date   WBC 9.8 09/15/2018   HGB 13.9 09/15/2018   HCT 39.8 09/15/2018   MCV 88.2 09/15/2018   PLT 280 09/15/2018   Lab Results  Component Value Date   CREATININE 0.87 06/28/2019   BUN 10 06/28/2019   NA 126 (L) 06/28/2019   K 4.2 06/28/2019   CL 93 (L) 06/28/2019   CO2 26 06/28/2019   Lab Results  Component Value Date   ALT 12 06/28/2019   AST 15 06/28/2019   ALKPHOS 128 (H) 06/28/2019   BILITOT 0.5 06/28/2019   Lab Results  Component Value Date   CHOL 113 12/24/2018   Lab Results  Component Value Date   HDL 57.00 12/24/2018   Lab Results  Component Value Date   LDLCALC 45 12/24/2018   Lab Results  Component Value Date   TRIG 54.0 12/24/2018   Lab Results  Component Value Date   CHOLHDL 2  12/24/2018   Lab Results  Component Value Date   PSA 0.23 12/24/2018   PSA 0.23 11/18/2017   PSA 0.26 11/14/2016   Lab Results  Component Value Date   HGBA1C 7.0 (H) 06/28/2019    IMPRESSION AND PLAN:  1) MDD, single episode, mild/mod, in partial (60%)remission. Continue citalopram 40mg  qd. Hopefully will have ongoing therapeutic improvement the next couple months.  2) HTN, now well controlled. Continue losartan 100mg  qd. BMET today.  An After Visit Summary was printed and given to the patient. 1-2 mo f/u dm, etc  FOLLOW UP: Return in about 4 weeks (around 09/23/2019) for routine chronic illness f/u (non-fasting).  Signed:  Santiago Bumpers, MD           08/26/2019

## 2019-08-27 LAB — BASIC METABOLIC PANEL
BUN: 14 mg/dL (ref 7–25)
CO2: 27 mmol/L (ref 20–32)
Calcium: 9.1 mg/dL (ref 8.6–10.3)
Chloride: 94 mmol/L — ABNORMAL LOW (ref 98–110)
Creat: 0.93 mg/dL (ref 0.70–1.33)
Glucose, Bld: 55 mg/dL — ABNORMAL LOW (ref 65–99)
Potassium: 4.2 mmol/L (ref 3.5–5.3)
Sodium: 130 mmol/L — ABNORMAL LOW (ref 135–146)

## 2019-08-30 ENCOUNTER — Other Ambulatory Visit: Payer: Self-pay

## 2019-08-30 MED ORDER — NOVOLOG FLEXPEN 100 UNIT/ML ~~LOC~~ SOPN: PEN_INJECTOR | SUBCUTANEOUS | 1 refills | Status: DC

## 2019-09-06 ENCOUNTER — Other Ambulatory Visit: Payer: Self-pay

## 2019-09-06 ENCOUNTER — Telehealth: Payer: Self-pay

## 2019-09-06 ENCOUNTER — Telehealth (INDEPENDENT_AMBULATORY_CARE_PROVIDER_SITE_OTHER): Payer: BC Managed Care – PPO | Admitting: Family Medicine

## 2019-09-06 ENCOUNTER — Encounter: Payer: Self-pay | Admitting: Family Medicine

## 2019-09-06 VITALS — BP 112/88 | Wt 148.0 lb

## 2019-09-06 DIAGNOSIS — E119 Type 2 diabetes mellitus without complications: Secondary | ICD-10-CM

## 2019-09-06 DIAGNOSIS — R197 Diarrhea, unspecified: Secondary | ICD-10-CM

## 2019-09-06 NOTE — Progress Notes (Signed)
Virtual Visit via Video Note  I connected with on 09/06/19 at  4:00 PM EDT by a video enabled telemedicine application and verified that I am speaking with the correct person using two identifiers.  Location patient: home Location provider:work or home office Persons participating in the virtual visit: patient, provider  I discussed the limitations of evaluation and management by telemedicine and the availability of in person appointments. The patient expressed understanding and agreed to proceed.   HPI: 54 y/o WM being seen today for diarrhea. Onset 14 d/a, postprandial urgent diarrhea. Happens regardless of what he eats. Took pepto and all this has done is turn his loose bms black. Feels need to have defacation even when has to only pass gas, also in middle of night. Some abd cramping.  Appetite is great.  No n/v.   No recent sick contacts.   No new med around the time it started. Sx's have been stable over 2 wk period.  No blood or mucous noted.  He feels like the volume is "quite a bit" each time. He ran low sugars so he backed off on insulin dosing some.  ROS: no fevers, no CP, no SOB, no wheezing, no cough, no dizziness, no HAs, no rashes, no melena/hematochezia.  No polyuria or polydipsia.  No myalgias or arthralgias.  No focal weakness, paresthesias, or tremors.  No acute vision or hearing abnormalities. No n/v/d or abd pain.  No palpitations.   Occ pain in low back diffusely No recent abx.  ROS: no fevers, no CP, no SOB, no wheezing, no cough, no dizziness, no HAs, no rashes, no melena/hematochezia.  No polyuria or polydipsia.  No myalgias or arthralgias.  No focal weakness, paresthesias, or tremors.  No acute vision or hearing abnormalities. No n/v/d or abd pain.  No palpitations.     Past Medical History:  Diagnosis Date  . Back pain    s/p back surgery  . Chronic hyponatremia 08/2012 onset   Onset was right after starting amitriptyline for his neuropathic leg pain--has  been stable.  Likely ADH-like effect on kidney from amitriptyline.  Marland Kitchen History of low back pain 06/24/2011  . Hyperlipidemia   . Hypothyroidism   . LADA (latent autoimmune diabetes in adults), managed as type 1 ([facility]) Dx'd 2014   Dr. Steffanie Dunn.  . Migraines   . Neuropathic pain of right lower extremity   . PLANTAR WART, RIGHT 10/09/2009  . Snoring    Sleep study neg of OSA 04/2018  . Tobacco dependence    quit 2012; restarted not long after    Past Surgical History:  Procedure Laterality Date  . BACK SURGERY  2009   lower lumbar fusion- rubber disc in back  . CARDIOVASCULAR STRESS TEST  03/21/14   No ischemia/low risk study; however, LV function showed EF 49% and global LV hypokinesis--echo recommended and it showed normal LV fxn.  Marland Kitchen NASAL SINUS SURGERY  2007  . TRANSTHORACIC ECHOCARDIOGRAM  03/24/14   Normal LV fxn, EF 50-55%, no wall motion abnormalities: (grade 1 diast dysfxn)    Family History  Problem Relation Age of Onset  . Emphysema Mother        smoker  . Obesity Father      Current Outpatient Medications:  .  amitriptyline (ELAVIL) 25 MG tablet, TAKE ONE TABLET BY MOUTH EVERY MORNING AND TAKE THREE TABLETS at bedtime FOR right IN LEG PAIN, Disp: 120 tablet, Rfl: 5 .  atorvastatin (LIPITOR) 20 MG tablet, Take 1 tablet (20 mg  total) by mouth daily., Disp: 90 tablet, Rfl: 3 .  Bismuth Subsalicylate (PEPTO-BISMOL PO), Take by mouth as needed., Disp: , Rfl:  .  citalopram (CELEXA) 40 MG tablet, Take 1 tablet (40 mg total) by mouth daily., Disp: 90 tablet, Rfl: 3 .  Continuous Blood Gluc Sensor (FREESTYLE LIBRE SENSOR SYSTEM) MISC, USE 1 EACH BY DOES NOT APPLY ROUTE EVERY 10 DAYS., Disp: , Rfl: 99 .  fluticasone (FLONASE) 50 MCG/ACT nasal spray, Place 2 sprays into both nostrils daily., Disp: 16 g, Rfl: 3 .  insulin aspart (NOVOLOG FLEXPEN) 100 UNIT/ML FlexPen, 5 units qAC, Disp: 15 mL, Rfl: 1 .  Insulin Glargine (LANTUS SOLOSTAR) 100 UNIT/ML Solostar Pen, Inject 16 Units into the  skin at bedtime., Disp: , Rfl:  .  Insulin Pen Needle 32G X 4 MM MISC, (BD ULTRA-FINE NEEDLES) USE 4 TIMES DAILY AS NEEDED. DX:E11.65, Disp: 100 each, Rfl: 11 .  losartan (COZAAR) 100 MG tablet, Take 1 tablet (100 mg total) by mouth daily., Disp: 90 tablet, Rfl: 3 .  pantoprazole (PROTONIX) 40 MG tablet, Take 1 tablet (40 mg total) by mouth daily., Disp: 30 tablet, Rfl: 3 .  SYNTHROID 137 MCG tablet, TAKE ONE TABLET BY MOUTH EVERY DAY BEFORE BREAKFAST, Disp: 30 tablet, Rfl: 3 .  tamsulosin (FLOMAX) 0.4 MG CAPS capsule, Take 1 capsule (0.4 mg total) by mouth daily after supper., Disp: 90 capsule, Rfl: 3 .  diclofenac Sodium (VOLTAREN) 1 % GEL, Apply 2 g topically 4 (four) times daily. (Patient not taking: Reported on 08/26/2019), Disp: 100 g, Rfl: 3  EXAM:  VITALS per patient if applicable: BP (!) 409/81 (BP Location: Left Arm, Patient Position: Sitting, Cuff Size: Normal)   Wt 148 lb (67.1 kg)   SpO2 98%   BMI 24.63 kg/m    GENERAL: alert, oriented, appears well and in no acute distress  HEENT: atraumatic, conjunttiva clear, no obvious abnormalities on inspection of external nose and ears  NECK: normal movements of the head and neck  LUNGS: on inspection no signs of respiratory distress, breathing rate appears normal, no obvious gross SOB, gasping or wheezing  CV: no obvious cyanosis  MS: moves all visible extremities without noticeable abnormality  PSYCH/NEURO: pleasant and cooperative, no obvious depression or anxiety, speech and thought processing grossly intact  LABS: none today.    Chemistry      Component Value Date/Time   NA 130 (L) 08/26/2019 1542   NA 123 (A) 09/15/2018 0000   K 4.2 08/26/2019 1542   CL 94 (L) 08/26/2019 1542   CO2 27 08/26/2019 1542   BUN 14 08/26/2019 1542   CREATININE 0.93 08/26/2019 1542   GLU 231 09/15/2018 0000      Component Value Date/Time   CALCIUM 9.1 08/26/2019 1542   ALKPHOS 128 (H) 06/28/2019 1153   AST 15 06/28/2019 1153   ALT 12  06/28/2019 1153   BILITOT 0.5 06/28/2019 1153     Lab Results  Component Value Date   HGBA1C 7.0 (H) 06/28/2019    ASSESSMENT AND PLAN:  Discussed the following assessment and plan:  Diarrhea, unknown cause. Not c/w typical viral enteritis.  Functional diarrhea possible but seems to have some symptoms even when not having recently eaten. Has DM, he's watching for hypoglycemia and decreasing insulin appropriately. I recommended d/c pepto and start trial of otc imodium, encouraged him to dose this sparingly to avoid ileus/constipation. Will have him come in for lab visit for CMET and CBC and pick up stool collection  kit (c diff pcr, bact culture, o &p, giardia/crypto.  -we discussed possible serious and likely etiologies, options for evaluation and workup, limitations of telemedicine visit vs in person visit, treatment, treatment risks and precautions. Pt prefers to treat via telemedicine empirically rather then risking or undertaking an in person visit at this moment. Patient agrees to seek prompt in person care if worsening, new symptoms arise, or if is not improving with treatment.   I discussed the assessment and treatment plan with the patient. The patient was provided an opportunity to ask questions and all were answered. The patient agreed with the plan and demonstrated an understanding of the instructions.   The patient was advised to call back or seek an in-person evaluation if the symptoms worsen or if the condition fails to improve as anticipated.  F/u: to be determined based on results of w/u  Signed:  Crissie Sickles, MD           09/06/2019

## 2019-09-06 NOTE — Telephone Encounter (Signed)
Patient has been having diarrhea for the past 14 days along with feeling dehydrated recently. Unsure if this is a side effect from medications he currently takes. Appt scheduled with PCP for virtual. He is aware he will receive a call if it needs to be in office.

## 2019-09-08 ENCOUNTER — Ambulatory Visit (INDEPENDENT_AMBULATORY_CARE_PROVIDER_SITE_OTHER): Payer: BC Managed Care – PPO | Admitting: Family Medicine

## 2019-09-08 ENCOUNTER — Other Ambulatory Visit: Payer: Self-pay

## 2019-09-08 DIAGNOSIS — R197 Diarrhea, unspecified: Secondary | ICD-10-CM | POA: Diagnosis not present

## 2019-09-08 DIAGNOSIS — E119 Type 2 diabetes mellitus without complications: Secondary | ICD-10-CM | POA: Diagnosis not present

## 2019-09-08 NOTE — Addendum Note (Signed)
Addended by: Eulah Pont on: 09/08/2019 03:24 PM   Modules accepted: Orders

## 2019-09-09 LAB — CBC WITH DIFFERENTIAL/PLATELET
Basophils Absolute: 0.1 10*3/uL (ref 0.0–0.1)
Basophils Relative: 1.3 % (ref 0.0–3.0)
Eosinophils Absolute: 0.2 10*3/uL (ref 0.0–0.7)
Eosinophils Relative: 2.1 % (ref 0.0–5.0)
HCT: 38.8 % — ABNORMAL LOW (ref 39.0–52.0)
Hemoglobin: 13.1 g/dL (ref 13.0–17.0)
Lymphocytes Relative: 13.8 % (ref 12.0–46.0)
Lymphs Abs: 1.5 10*3/uL (ref 0.7–4.0)
MCHC: 33.8 g/dL (ref 30.0–36.0)
MCV: 93 fl (ref 78.0–100.0)
Monocytes Absolute: 1.2 10*3/uL — ABNORMAL HIGH (ref 0.1–1.0)
Monocytes Relative: 10.5 % (ref 3.0–12.0)
Neutro Abs: 7.9 10*3/uL — ABNORMAL HIGH (ref 1.4–7.7)
Neutrophils Relative %: 72.3 % (ref 43.0–77.0)
Platelets: 313 10*3/uL (ref 150.0–400.0)
RBC: 4.17 Mil/uL — ABNORMAL LOW (ref 4.22–5.81)
RDW: 13.7 % (ref 11.5–15.5)
WBC: 10.9 10*3/uL — ABNORMAL HIGH (ref 4.0–10.5)

## 2019-09-09 LAB — COMPREHENSIVE METABOLIC PANEL
ALT: 17 U/L (ref 0–53)
AST: 21 U/L (ref 0–37)
Albumin: 3.8 g/dL (ref 3.5–5.2)
Alkaline Phosphatase: 120 U/L — ABNORMAL HIGH (ref 39–117)
BUN: 9 mg/dL (ref 6–23)
CO2: 27 mEq/L (ref 19–32)
Calcium: 8.8 mg/dL (ref 8.4–10.5)
Chloride: 95 mEq/L — ABNORMAL LOW (ref 96–112)
Creatinine, Ser: 0.96 mg/dL (ref 0.40–1.50)
GFR: 81.42 mL/min (ref >60.00)
Glucose, Bld: 125 mg/dL — ABNORMAL HIGH (ref 70–99)
Potassium: 4 mEq/L (ref 3.5–5.1)
Sodium: 128 mEq/L — ABNORMAL LOW (ref 135–145)
Total Bilirubin: 0.4 mg/dL (ref 0.2–1.2)
Total Protein: 6.3 g/dL (ref 6.0–8.3)

## 2019-09-17 LAB — OVA AND PARASITE EXAMINATION
CONCENTRATE RESULT:: NONE SEEN
MICRO NUMBER:: 10760098
SPECIMEN QUALITY:: ADEQUATE
TRICHROME RESULT:: NONE SEEN

## 2019-09-17 LAB — STOOL CULTURE
MICRO NUMBER:: 10760094
MICRO NUMBER:: 10760096
MICRO NUMBER:: 10760099
SHIGA RESULT:: NOT DETECTED
SPECIMEN QUALITY:: ADEQUATE
SPECIMEN QUALITY:: ADEQUATE
SPECIMEN QUALITY:: ADEQUATE

## 2019-09-17 LAB — GIARDIA/CRYPTOSPORIDIUM (EIA)
MICRO NUMBER:: 10760095
MICRO NUMBER:: 10760097
RESULT:: NOT DETECTED
RESULT:: NOT DETECTED
SPECIMEN QUALITY:: ADEQUATE
SPECIMEN QUALITY:: ADEQUATE

## 2019-09-17 LAB — CLOSTRIDIUM DIFFICILE TOXIN B, QUALITATIVE, REAL-TIME PCR: Toxigenic C. Difficile by PCR: NOT DETECTED

## 2019-09-21 ENCOUNTER — Other Ambulatory Visit: Payer: Self-pay | Admitting: Family Medicine

## 2019-09-23 ENCOUNTER — Other Ambulatory Visit: Payer: Self-pay

## 2019-09-23 ENCOUNTER — Ambulatory Visit (INDEPENDENT_AMBULATORY_CARE_PROVIDER_SITE_OTHER): Payer: BC Managed Care – PPO | Admitting: Family Medicine

## 2019-09-23 ENCOUNTER — Encounter: Payer: Self-pay | Admitting: Family Medicine

## 2019-09-23 VITALS — BP 120/74 | HR 87 | Temp 98.0°F | Resp 16 | Ht 65.0 in | Wt 146.6 lb

## 2019-09-23 DIAGNOSIS — E139 Other specified diabetes mellitus without complications: Secondary | ICD-10-CM | POA: Diagnosis not present

## 2019-09-23 DIAGNOSIS — F324 Major depressive disorder, single episode, in partial remission: Secondary | ICD-10-CM

## 2019-09-23 DIAGNOSIS — I10 Essential (primary) hypertension: Secondary | ICD-10-CM

## 2019-09-23 DIAGNOSIS — R197 Diarrhea, unspecified: Secondary | ICD-10-CM | POA: Diagnosis not present

## 2019-09-23 LAB — POCT GLYCOSYLATED HEMOGLOBIN (HGB A1C): Hemoglobin A1C: 6.4 % — AB (ref 4.0–5.6)

## 2019-09-23 MED ORDER — NOVOLOG FLEXPEN 100 UNIT/ML ~~LOC~~ SOPN: PEN_INJECTOR | SUBCUTANEOUS | 1 refills | Status: DC

## 2019-09-23 MED ORDER — BUPROPION HCL ER (XL) 150 MG PO TB24: 150.0000 mg | ORAL_TABLET | Freq: Every day | ORAL | 1 refills | Status: DC

## 2019-09-23 NOTE — Progress Notes (Signed)
OFFICE VISIT  09/23/2019   CC:  Chief Complaint  Patient presents with  . Follow-up    RCI, pt is not fasting     HPI:    Patient is a 54 y.o. Caucasian male who presents for f/u DM,HTN, hypothyroidism, MDD. Also f/u 09/06/19 video visit for diarrhea.  All blood tests and stool tests were neg/stable. Loose BMs letting up quite a bit now.  Was 8-10 per day for a while, w/out abd pain, fever, or loss of appetite.  No aching.  Now taking imodium 1-2 times per day and max 2 BMs /day, still loose.  DM: taking 16 U lantus qd, novolog 6 U qAC, Glucoses avg over last 7d 141.  Last 90d 141. HTN: bp's at home good.  Mood: still feeling much better, still with some increased irritability during each day, some prob with sleep initiation and maintaining sleep---mind still won't shut off. Still some worry and anxiety that he feels is excessive and not the way he used to be. No hopelessness, anhedonia, SI or HI.  ROS: no fevers, no CP, no SOB, no wheezing, no cough, no dizziness, no HAs, no rashes, no melena/hematochezia.  No polyuria or polydipsia.  No myalgias or arthralgias.  No focal weakness, paresthesias, or tremors.  No acute vision or hearing abnormalities.  No palpitations.     Past Medical History:  Diagnosis Date  . Back pain    s/p back surgery  . Chronic hyponatremia 08/2012 onset   Onset was right after starting amitriptyline for his neuropathic leg pain--has been stable.  Likely ADH-like effect on kidney from amitriptyline.  Marland Kitchen History of low back pain 06/24/2011  . Hyperlipidemia   . Hypothyroidism   . LADA (latent autoimmune diabetes in adults), managed as type 1 ([facility]CC) Dx'd 2014   Dr. Morrison Old.  . Migraines   . Neuropathic pain of right lower extremity   . PLANTAR WART, RIGHT 10/09/2009  . Snoring    Sleep study neg of OSA 04/2018  . Tobacco dependence    quit 2012; restarted not long after    Past Surgical History:  Procedure Laterality Date  . BACK SURGERY  2009    lower lumbar fusion- rubber disc in back  . CARDIOVASCULAR STRESS TEST  03/21/14   No ischemia/low risk study; however, LV function showed EF 49% and global LV hypokinesis--echo recommended and it showed normal LV fxn.  Marland Kitchen NASAL SINUS SURGERY  2007  . TRANSTHORACIC ECHOCARDIOGRAM  03/24/14   Normal LV fxn, EF 50-55%, no wall motion abnormalities: (grade 1 diast dysfxn)    Outpatient Medications Prior to Visit  Medication Sig Dispense Refill  . amitriptyline (ELAVIL) 25 MG tablet TAKE ONE TABLET BY MOUTH EVERY MORNING AND TAKE THREE TABLETS at bedtime FOR right IN LEG PAIN 120 tablet 5  . atorvastatin (LIPITOR) 20 MG tablet Take 1 tablet (20 mg total) by mouth daily. 90 tablet 3  . Bismuth Subsalicylate (PEPTO-BISMOL PO) Take by mouth as needed.    . citalopram (CELEXA) 40 MG tablet Take 1 tablet (40 mg total) by mouth daily. 90 tablet 3  . Continuous Blood Gluc Sensor (FREESTYLE LIBRE SENSOR SYSTEM) MISC USE 1 EACH BY DOES NOT APPLY ROUTE EVERY 10 DAYS.  99  . diclofenac Sodium (VOLTAREN) 1 % GEL Apply 2 g topically 4 (four) times daily. 100 g 3  . fluticasone (FLONASE) 50 MCG/ACT nasal spray Place 2 sprays into both nostrils daily. 16 g 3  . Insulin Glargine (LANTUS SOLOSTAR) 100  UNIT/ML Solostar Pen Inject 16 Units into the skin at bedtime.    Marland Kitchen losartan (COZAAR) 100 MG tablet Take 1 tablet (100 mg total) by mouth daily. 90 tablet 3  . pantoprazole (PROTONIX) 40 MG tablet Take 1 tablet (40 mg total) by mouth daily. 30 tablet 3  . SURE COMFORT PEN NEEDLES 32G X 4 MM MISC USE FOUR TIMES DAILY AS NEEDED 100 each 10  . SYNTHROID 137 MCG tablet TAKE ONE TABLET BY MOUTH EVERY DAY BEFORE BREAKFAST 30 tablet 3  . tamsulosin (FLOMAX) 0.4 MG CAPS capsule Take 1 capsule (0.4 mg total) by mouth daily after supper. 90 capsule 3  . insulin aspart (NOVOLOG FLEXPEN) 100 UNIT/ML FlexPen 5 units qAC 15 mL 1   No facility-administered medications prior to visit.    Allergies  Allergen Reactions  . Augmentin  [Amoxicillin-Pot Clavulanate] Nausea And Vomiting  . Oxycodone-Acetaminophen     REACTION: itching  . Levothyroxine Rash    Had rash associated with generic levothyroxine, but not brand-name Synthroid.  Likely allergic to a dye or filler in the generic preparation.  . Lisinopril Cough    ROS As per HPI  PE: Vitals with BMI 09/23/2019 09/06/2019 08/26/2019  Height 5\' 5"  - 5\' 5"   Weight 146 lbs 10 oz 148 lbs 147 lbs 6 oz  BMI 24.4 24.63 24.53  Systolic 120 112  Diastolic 74 88 67  Pulse 87 - 97  O2 sat on RA today is 97%  Gen: Alert, well appearing.  Patient is oriented to person, place, time, and situation. AFFECT: pleasant, lucid thought and speech. : no injection, icteris, swelling, or exudate.  EOMI, PERRLA. Mouth: lips without lesion/swelling.  Oral mucosa pink and moist. Oropharynx without erythema, exudate, or swelling.  CV: RRR, no m/r/g.   LUNGS: CTA bilat, nonlabored resps, good aeration in all lung fields. ABD: soft, NT/ND EXT: no clubbing or cyanosis.  no edema.    LABS:  Lab Results  Component Value Date   TSH 0.46 06/28/2019   Lab Results  Component Value Date   WBC 10.9 (H) 09/08/2019   HGB 13.1 09/08/2019   HCT 38.8 (L) 09/08/2019   MCV 93.0 09/08/2019   PLT 313.0 09/08/2019   Lab Results  Component Value Date   CREATININE 0.96 09/08/2019   BUN 9 09/08/2019   NA 128 (L) 09/08/2019   K 4.0 09/08/2019   CL 95 (L) 09/08/2019   CO2 27 09/08/2019   Lab Results  Component Value Date   ALT 17 09/08/2019   AST 21 09/08/2019   ALKPHOS 120 (H) 09/08/2019   BILITOT 0.4 09/08/2019   Lab Results  Component Value Date   CHOL 113 12/24/2018   Lab Results  Component Value Date   HDL 57.00 12/24/2018   Lab Results  Component Value Date   LDLCALC 45 12/24/2018   Lab Results  Component Value Date   TRIG 54.0 12/24/2018   Lab Results  Component Value Date   CHOLHDL 2 12/24/2018   Lab Results  Component Value Date   PSA 0.23  12/24/2018   PSA 0.23 11/18/2017   PSA 0.26 11/14/2016   Lab Results  Component Value Date   HGBA1C 7.0 (H) 06/28/2019    POC A1c today is 6.4%  IMPRESSION AND PLAN:  1) Diarrhea: unclear etiology, resolving at this time. 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  Expect gradual return to normal.  2) DM: signif improvement last 3 mo, continue current mgmt.  3) HTN;  The current medical regimen is effective;  continue present plan and medications.  4) Depression/anxiety: his improvement on citalopram 40mg  has been good but has plateaued, short of complete remission.  Add wellbutrin xl 150mg  qAM.  An After Visit Summary was printed and given to the patient.  FOLLOW UP: Return in about 6 weeks (around 11/04/2019) for f/u mood/anxiety. Next CPE 3 mo  Signed:  , MD           09/23/2019

## 2019-09-24 ENCOUNTER — Other Ambulatory Visit: Payer: Self-pay | Admitting: Family Medicine

## 2019-09-28 ENCOUNTER — Telehealth: Payer: Self-pay

## 2019-09-28 NOTE — Telephone Encounter (Signed)
Received call from [facility] regarding Wellbutrin(Buproprion), the patient contacted the pharmacy concerned about taking both citalopram and wellbutrin. During last o/v on 8/12, wellbutrin was to taken every morning in addition to citalopram. Returned call to [facility] to inform and she will call and let the patient know ok to take both.

## 2019-10-22 ENCOUNTER — Other Ambulatory Visit: Payer: Self-pay | Admitting: Family Medicine

## 2019-11-04 ENCOUNTER — Ambulatory Visit: Payer: BC Managed Care – PPO | Admitting: Family Medicine

## 2019-11-04 ENCOUNTER — Other Ambulatory Visit: Payer: Self-pay

## 2019-11-04 ENCOUNTER — Other Ambulatory Visit: Payer: Self-pay | Admitting: Family Medicine

## 2019-11-04 ENCOUNTER — Encounter: Payer: Self-pay | Admitting: Family Medicine

## 2019-11-04 VITALS — BP 135/84 | HR 78 | Temp 97.9°F | Resp 18 | Ht 65.0 in | Wt 148.2 lb

## 2019-11-04 DIAGNOSIS — F411 Generalized anxiety disorder: Secondary | ICD-10-CM | POA: Diagnosis not present

## 2019-11-04 DIAGNOSIS — F324 Major depressive disorder, single episode, in partial remission: Secondary | ICD-10-CM | POA: Diagnosis not present

## 2019-11-04 DIAGNOSIS — Z23 Encounter for immunization: Secondary | ICD-10-CM

## 2019-11-04 MED ORDER — BUPROPION HCL ER (XL) 300 MG PO TB24: 300.0000 mg | ORAL_TABLET | Freq: Every day | ORAL | 1 refills | Status: DC

## 2019-11-04 NOTE — Progress Notes (Signed)
OFFICE VISIT  11/04/2019  CC:  Chief Complaint  Patient presents with  . Follow-up    HPI:    Patient is a 54 y.o. Caucasian male who presents for 6 week f/u MDD. A/P as of last visit: "1) Diarrhea: unclear etiology, resolving at this time. 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  Expect gradual return to normal.  2) DM: signif improvement last 3 mo, continue current mgmt.  3) HTN; The current medical regimen is effective;  continue present plan and medications.  4) Depression/anxiety: his improvement on citalopram 40mg  has been good but has plateaued, short of complete remission.  Add wellbutrin xl 150mg  qAM."  INTERIM HX: Not as agitated.  Still feeling like things are continuing to get a lot better.  Minimal anhedonia and "feeling down" is minimal.  Biggest issues still are poor sleep and poor energy.   No RLS.  Mind ruminates over worries and obligations. Describes good sleep hygiene.  No daytime naps.  Trouble initiating sleep and wakes up q2hrs, goes back to sleep after 5 min or so.  Takes about 10-15 min to initiate sleep when he first goes to bed.  His job is physical but does not do any formal exercise at all.   No alcohol.  Smokes cigs, no more than his usual. No side effects from wellbutrin. No more diarrhea.    I've been treating him with meds for MDD for about 3 months now.  Past Medical History:  Diagnosis Date  . Back pain    s/p back surgery  . Chronic hyponatremia 08/2012 onset   Onset was right after starting amitriptyline for his neuropathic leg pain--has been stable.  Likely ADH-like effect on kidney from amitriptyline.  History of low back pain 06/24/2011  . Hyperlipidemia   . Hypothyroidism   . LADA (latent autoimmune diabetes in adults), managed as type 1 (HCC) Dx'd 2014   Dr. 06/26/2011.  . Migraines   . Neuropathic pain of right lower extremity   . PLANTAR WART, RIGHT 10/09/2009  . Snoring    Sleep study neg of OSA 04/2018  . Tobacco  dependence    quit 2012; restarted not long after    Past Surgical History:  Procedure Laterality Date  . BACK SURGERY  2009   lower lumbar fusion- rubber disc in back  . CARDIOVASCULAR STRESS TEST  03/21/14   No ischemia/low risk study; however, LV function showed EF 49% and global LV hypokinesis--echo recommended and it showed normal LV fxn.  2010 NASAL SINUS SURGERY  2007  . TRANSTHORACIC ECHOCARDIOGRAM  03/24/14   Normal LV fxn, EF 50-55%, no wall motion abnormalities: (grade 1 diast dysfxn)    Outpatient Medications Prior to Visit  Medication Sig Dispense Refill  . amitriptyline (ELAVIL) 25 MG tablet TAKE ONE TABLET BY MOUTH EVERY MORNING AND TAKE THREE TABLETS at bedtime FOR right IN LEG PAIN 120 tablet 5  . atorvastatin (LIPITOR) 20 MG tablet Take 1 tablet (20 mg total) by mouth daily. 90 tablet 3  . Bismuth Subsalicylate (PEPTO-BISMOL PO) Take by mouth as needed.    . citalopram (CELEXA) 40 MG tablet Take 1 tablet (40 mg total) by mouth daily. 90 tablet 3  . Continuous Blood Gluc Sensor (FREESTYLE LIBRE SENSOR SYSTEM) MISC USE 1 EACH BY DOES NOT APPLY ROUTE EVERY 10 DAYS.  99  . diclofenac Sodium (VOLTAREN) 1 % GEL Apply 2 g topically 4 (four) times daily. 100 g 3  . fluticasone (FLONASE) 50 MCG/ACT nasal spray  Place 2 sprays into both nostrils daily. 16 g 3  . insulin aspart (NOVOLOG FLEXPEN) 100 UNIT/ML FlexPen 6 units qAC 15 mL 1  . Insulin Glargine (LANTUS SOLOSTAR) 100 UNIT/ML Solostar Pen Inject 16 Units into the skin at bedtime.    Marland Kitchen losartan (COZAAR) 100 MG tablet Take 1 tablet (100 mg total) by mouth daily. 90 tablet 3  . pantoprazole (PROTONIX) 40 MG tablet TAKE ONE TABLET BY MOUTH EVERY DAY 30 tablet 5  . SURE COMFORT PEN NEEDLES 32G X 4 MM MISC USE FOUR TIMES DAILY AS NEEDED 100 each 10  . SYNTHROID 137 MCG tablet TAKE ONE TABLET BY MOUTH EVERY DAY BEFORE BREAKFAST 30 tablet 3  . tamsulosin (FLOMAX) 0.4 MG CAPS capsule Take 1 capsule (0.4 mg total) by mouth daily after  supper. 90 capsule 3  . buPROPion (WELLBUTRIN XL) 150 MG 24 hr tablet Take 1 tablet (150 mg total) by mouth daily. 30 tablet 1   No facility-administered medications prior to visit.    Allergies  Allergen Reactions  . Augmentin [Amoxicillin-Pot Clavulanate] Nausea And Vomiting  . Oxycodone-Acetaminophen     REACTION: itching  . Levothyroxine Rash    Had rash associated with generic levothyroxine, but not brand-name Synthroid.  Likely allergic to a dye or filler in the generic preparation.  . Lisinopril Cough    ROS As per HPI  PE: Vitals with BMI 11/04/2019 09/23/2019 09/06/2019  Height 5\' 5"  5\' 5"  -  Weight 148 lbs 4 oz 146 lbs 10 oz 148 lbs  BMI 24.67 24.4 24.63  Systolic 135 120  Diastolic 84 74 88  Pulse 78 87 -    Gen: Alert, well appearing.  Patient is oriented to person, place, time, and situation. AFFECT: pleasant, lucid thought and speech. No further exam today.  LABS:  Lab Results  Component Value Date   TSH 0.46 06/28/2019   Lab Results  Component Value Date   WBC 10.9 (H) 09/08/2019   HGB 13.1 09/08/2019   HCT 38.8 (L) 09/08/2019   MCV 93.0 09/08/2019   PLT 313.0 09/08/2019   Lab Results  Component Value Date   CREATININE 0.96 09/08/2019   BUN 9 09/08/2019   NA 128 (L) 09/08/2019   K 4.0 09/08/2019   CL 95 (L) 09/08/2019   CO2 27 09/08/2019   Lab Results  Component Value Date   ALT 17 09/08/2019   AST 21 09/08/2019   ALKPHOS 120 (H) 09/08/2019   BILITOT 0.4 09/08/2019   Lab Results  Component Value Date   CHOL 113 12/24/2018   Lab Results  Component Value Date   HDL 57.00 12/24/2018   Lab Results  Component Value Date   LDLCALC 45 12/24/2018   Lab Results  Component Value Date   TRIG 54.0 12/24/2018   Lab Results  Component Value Date   CHOLHDL 2 12/24/2018   Lab Results  Component Value Date   PSA 0.23 12/24/2018   PSA 0.23 11/18/2017   PSA 0.26 11/14/2016   Lab Results  Component Value Date   HGBA1C 6.4 (A)  09/23/2019    IMPRESSION AND PLAN:  MDD, partial (estimated 80%) remission. Some improvement with wellbutrin xl 150mg  qd that was added to the citalopram that he has been on for 3 mo now. Increase wellbutrin xl to 300mg  qd and continue cital 40mg  qd. Hopefully his mild sleep and energy dysfunction will gradually improve---these sound mild and are not a big difficulty for him.  An After Visit  Summary was printed and given to the patient.  FOLLOW UP: Return in about 6 weeks (around 12/16/2019) for f/u mood/anx.  Signed:  Santiago Bumpers, MD           11/04/2019

## 2019-12-01 ENCOUNTER — Other Ambulatory Visit: Payer: Self-pay | Admitting: Family Medicine

## 2019-12-01 NOTE — Telephone Encounter (Signed)
Patient will return call in the morning to clarify if he has enough medications until appt on 11/4

## 2019-12-02 ENCOUNTER — Telehealth: Payer: Self-pay

## 2019-12-02 NOTE — Telephone Encounter (Signed)
Patient calling back from yesterday.  He asked me to give Daniel Stevenson a message. "He has enough meds until his appt 11/4. Thank you."  No need to call back.

## 2019-12-12 ENCOUNTER — Encounter: Payer: Self-pay | Admitting: Family Medicine

## 2019-12-13 NOTE — Telephone Encounter (Signed)
FYI pt has an appt scheduled for 12/16/19. Wife is concerned about his emotions and inability to be intimate.

## 2019-12-13 NOTE — Telephone Encounter (Signed)
Noted  

## 2019-12-16 ENCOUNTER — Other Ambulatory Visit: Payer: Self-pay

## 2019-12-16 ENCOUNTER — Ambulatory Visit: Payer: BC Managed Care – PPO | Admitting: Family Medicine

## 2019-12-16 ENCOUNTER — Encounter: Payer: Self-pay | Admitting: Family Medicine

## 2019-12-16 VITALS — BP 120/71 | HR 88 | Temp 97.6°F | Resp 16 | Ht 65.0 in | Wt 145.8 lb

## 2019-12-16 DIAGNOSIS — F325 Major depressive disorder, single episode, in full remission: Secondary | ICD-10-CM

## 2019-12-16 DIAGNOSIS — N529 Male erectile dysfunction, unspecified: Secondary | ICD-10-CM | POA: Diagnosis not present

## 2019-12-16 DIAGNOSIS — E119 Type 2 diabetes mellitus without complications: Secondary | ICD-10-CM | POA: Diagnosis not present

## 2019-12-16 DIAGNOSIS — T50905A Adverse effect of unspecified drugs, medicaments and biological substances, initial encounter: Secondary | ICD-10-CM

## 2019-12-16 MED ORDER — SILDENAFIL CITRATE 50 MG PO TABS: ORAL_TABLET | ORAL | 6 refills | Status: DC

## 2019-12-16 MED ORDER — BUPROPION HCL ER (XL) 150 MG PO TB24: ORAL_TABLET | ORAL | 0 refills | Status: DC

## 2019-12-16 NOTE — Progress Notes (Signed)
OFFICE VISIT  12/16/2019  CC:  Chief Complaint  Daniel Stevenson presents with  . Follow-up    mood, anxiety    HPI:    Daniel Stevenson is a 54 y.o. Caucasian male who presents for 6 wk f/u MDD. A/P as of last visit: "MDD, partial (estimated 80%) remission. Some improvement with wellbutrin xl 150mg  qd that was added to the citalopram that he has been on for 3 mo now. Increase wellbutrin xl to 300mg  qd and continue cital 40mg  qd. Hopefully his mild sleep and energy dysfunction will gradually improve---these sound mild and are not a big difficulty for him."  INTERIM HX: I've been treating him for MDD with meds for about 5 mo now. He has improved regarding depressed mood--essentially in remission-- but has been struggling with emotional blunting.  He says his wife is more troubled about this than he is.  He is pleased with his sleep, appetite, anxiety level, mood, energy level, etc. Says he's had ED for a while now, a little worse since getting on antidepressants.  Says libido is intact.    Past Medical History:  Diagnosis Date  . Back pain    s/p back surgery  . Chronic hyponatremia 08/2012 onset   Onset was right after starting amitriptyline for his neuropathic leg pain--has been stable.  Likely ADH-like effect on kidney from amitriptyline.  History of low back pain 06/24/2011  . Hyperlipidemia   . Hypothyroidism   . LADA (latent autoimmune diabetes in adults), managed as type 1 (HCC) Dx'd 2014   Dr. Marland Kitchen.  . Migraines   . Neuropathic pain of right lower extremity   . PLANTAR WART, RIGHT 10/09/2009  . Snoring    Sleep study neg of OSA 04/2018  . Tobacco dependence    quit 2012; restarted not long after    Past Surgical History:  Procedure Laterality Date  . BACK SURGERY  2009   lower lumbar fusion- rubber disc in back  . CARDIOVASCULAR STRESS TEST  03/21/14   No ischemia/low risk study; however, LV function showed EF 49% and global LV hypokinesis--echo recommended and it showed normal  LV fxn.  2013 NASAL SINUS SURGERY  2007  . TRANSTHORACIC ECHOCARDIOGRAM  03/24/14   Normal LV fxn, EF 50-55%, no wall motion abnormalities: (grade 1 diast dysfxn)    Outpatient Medications Prior to Visit  Medication Sig Dispense Refill  . amitriptyline (ELAVIL) 25 MG tablet TAKE ONE TABLET BY MOUTH EVERY MORNING AND TAKE THREE TABLETS at bedtime FOR right IN LEG PAIN 120 tablet 5  . atorvastatin (LIPITOR) 20 MG tablet Take 1 tablet (20 mg total) by mouth daily. 90 tablet 3  . citalopram (CELEXA) 40 MG tablet Take 1 tablet (40 mg total) by mouth daily. 90 tablet 3  . Continuous Blood Gluc Sensor (FREESTYLE LIBRE SENSOR SYSTEM) MISC USE 1 EACH BY DOES NOT APPLY ROUTE EVERY 10 DAYS.  99  . diclofenac Sodium (VOLTAREN) 1 % GEL Apply 2 g topically 4 (four) times daily. 100 g 3  . fluticasone (FLONASE) 50 MCG/ACT nasal spray Place 2 sprays into both nostrils daily. 16 g 3  . insulin aspart (NOVOLOG FLEXPEN) 100 UNIT/ML FlexPen 6 units qAC 15 mL 1  . Insulin Glargine (LANTUS SOLOSTAR) 100 UNIT/ML Solostar Pen Inject 16 Units into the skin at bedtime.    Marland Kitchen losartan (COZAAR) 100 MG tablet Take 1 tablet (100 mg total) by mouth daily. 90 tablet 3  . pantoprazole (PROTONIX) 40 MG tablet TAKE ONE TABLET BY MOUTH  EVERY DAY 30 tablet 5  . SURE COMFORT PEN NEEDLES 32G X 4 MM MISC USE FOUR TIMES DAILY AS NEEDED 100 each 10  . SYNTHROID 137 MCG tablet TAKE ONE TABLET BY MOUTH EVERY DAY BEFORE BREAKFAST 30 tablet 3  . tamsulosin (FLOMAX) 0.4 MG CAPS capsule Take 1 capsule (0.4 mg total) by mouth daily after supper. 90 capsule 3  . buPROPion (WELLBUTRIN XL) 300 MG 24 hr tablet Take 1 tablet (300 mg total) by mouth daily. 30 tablet 1  . Bismuth Subsalicylate (PEPTO-BISMOL PO) Take by mouth as needed. (Daniel Stevenson not taking: Reported on 12/16/2019)     No facility-administered medications prior to visit.    Allergies  Allergen Reactions  . Augmentin [Amoxicillin-Pot Clavulanate] Nausea And Vomiting  .  Oxycodone-Acetaminophen     REACTION: itching  . Levothyroxine Rash    Had rash associated with generic levothyroxine, but not brand-name Synthroid.  Likely allergic to a dye or filler in the generic preparation.  . Lisinopril Cough    ROS As per HPI  PE: Vitals with BMI 12/16/2019 11/04/2019 09/23/2019  Height 5\' 5"  5\' 5"  5\' 5"   Weight 145 lbs 13 oz 148 lbs 4 oz 146 lbs 10 oz  BMI 24.26 24.67 24.4  Systolic 120 135  Diastolic 71 84 74  Pulse 88 78 87     Gen: Alert, well appearing.  Daniel Stevenson is oriented to person, place, time, and situation. AFFECT: pleasant, lucid thought and speech. No further exam today.  LABS:    Chemistry      Component Value Date/Time   NA 128 (L) 09/08/2019 1433   NA 123 (A) 09/15/2018 0000   K 4.0 09/08/2019 1433   CL 95 (L) 09/08/2019 1433   CO2 27 09/08/2019 1433   BUN 9 09/08/2019 1433   CREATININE 0.96 09/08/2019 1433   CREATININE 0.93 08/26/2019 1542   GLU 231 09/15/2018 0000      Component Value Date/Time   CALCIUM 8.8 09/08/2019 1433   ALKPHOS 120 (H) 09/08/2019 1433   AST 21 09/08/2019 1433   ALT 17 09/08/2019 1433   BILITOT 0.4 09/08/2019 1433     Lab Results  Component Value Date   TSH 0.46 06/28/2019   Lab Results  Component Value Date   HGBA1C 6.4 (A) 09/23/2019   Lab Results  Component Value Date   CHOL 113 12/24/2018   HDL 57.00 12/24/2018   LDLCALC 45 12/24/2018   TRIG 54.0 12/24/2018   CHOLHDL 2 12/24/2018    IMPRESSION AND PLAN:  1) MDD, improved signif/in remission BUT has emotional blunting likely from being overmedicated. Will ween off wellbutrin: Take wellbutrin (bupropion) 150mg  ONE tab daily for 10d, then take ONE tab every other day for 10 doses. Continue citalopram 40mg  qd.  We'll see how things stand in 1 mo and see if any wean off citalopram is needed.  2) ED.  Had this prior to being on antidepressants, but seems to have gotten a little worse since meds. Trial of viagra 50mg , 1-2 qd  prn. Therapeutic expectations and side effect profile of medication discussed today.  Daniel Stevenson's questions answered.  DM 2, Hypoth-->BMET, A1c to be done at f/u in 1 mo. He showed me on his glucometer today that his avg gluc in the last 30d is 140.   An After Visit Summary was printed and given to the Daniel Stevenson.  FOLLOW UP: Return in about 4 weeks (around 01/13/2020) for f/u MDD/med side eff/ED.  Signed:  13/01/2019,  MD           12/16/2019

## 2019-12-16 NOTE — Patient Instructions (Signed)
Take wellbutrin (bupropion) 150mg  ONE tab daily for 10d, then take ONE tab every other day for 10 doses.

## 2019-12-17 ENCOUNTER — Other Ambulatory Visit: Payer: Self-pay | Admitting: Family Medicine

## 2020-01-13 IMAGING — CR DG CHEST 2V
2 series · 2 of 2 positions shown · non-contrast
Comparison: Radiographs March 08, 2014.

CLINICAL DATA: Cough, fever.

EXAM:
CHEST  2 VIEW

[w chest pa]
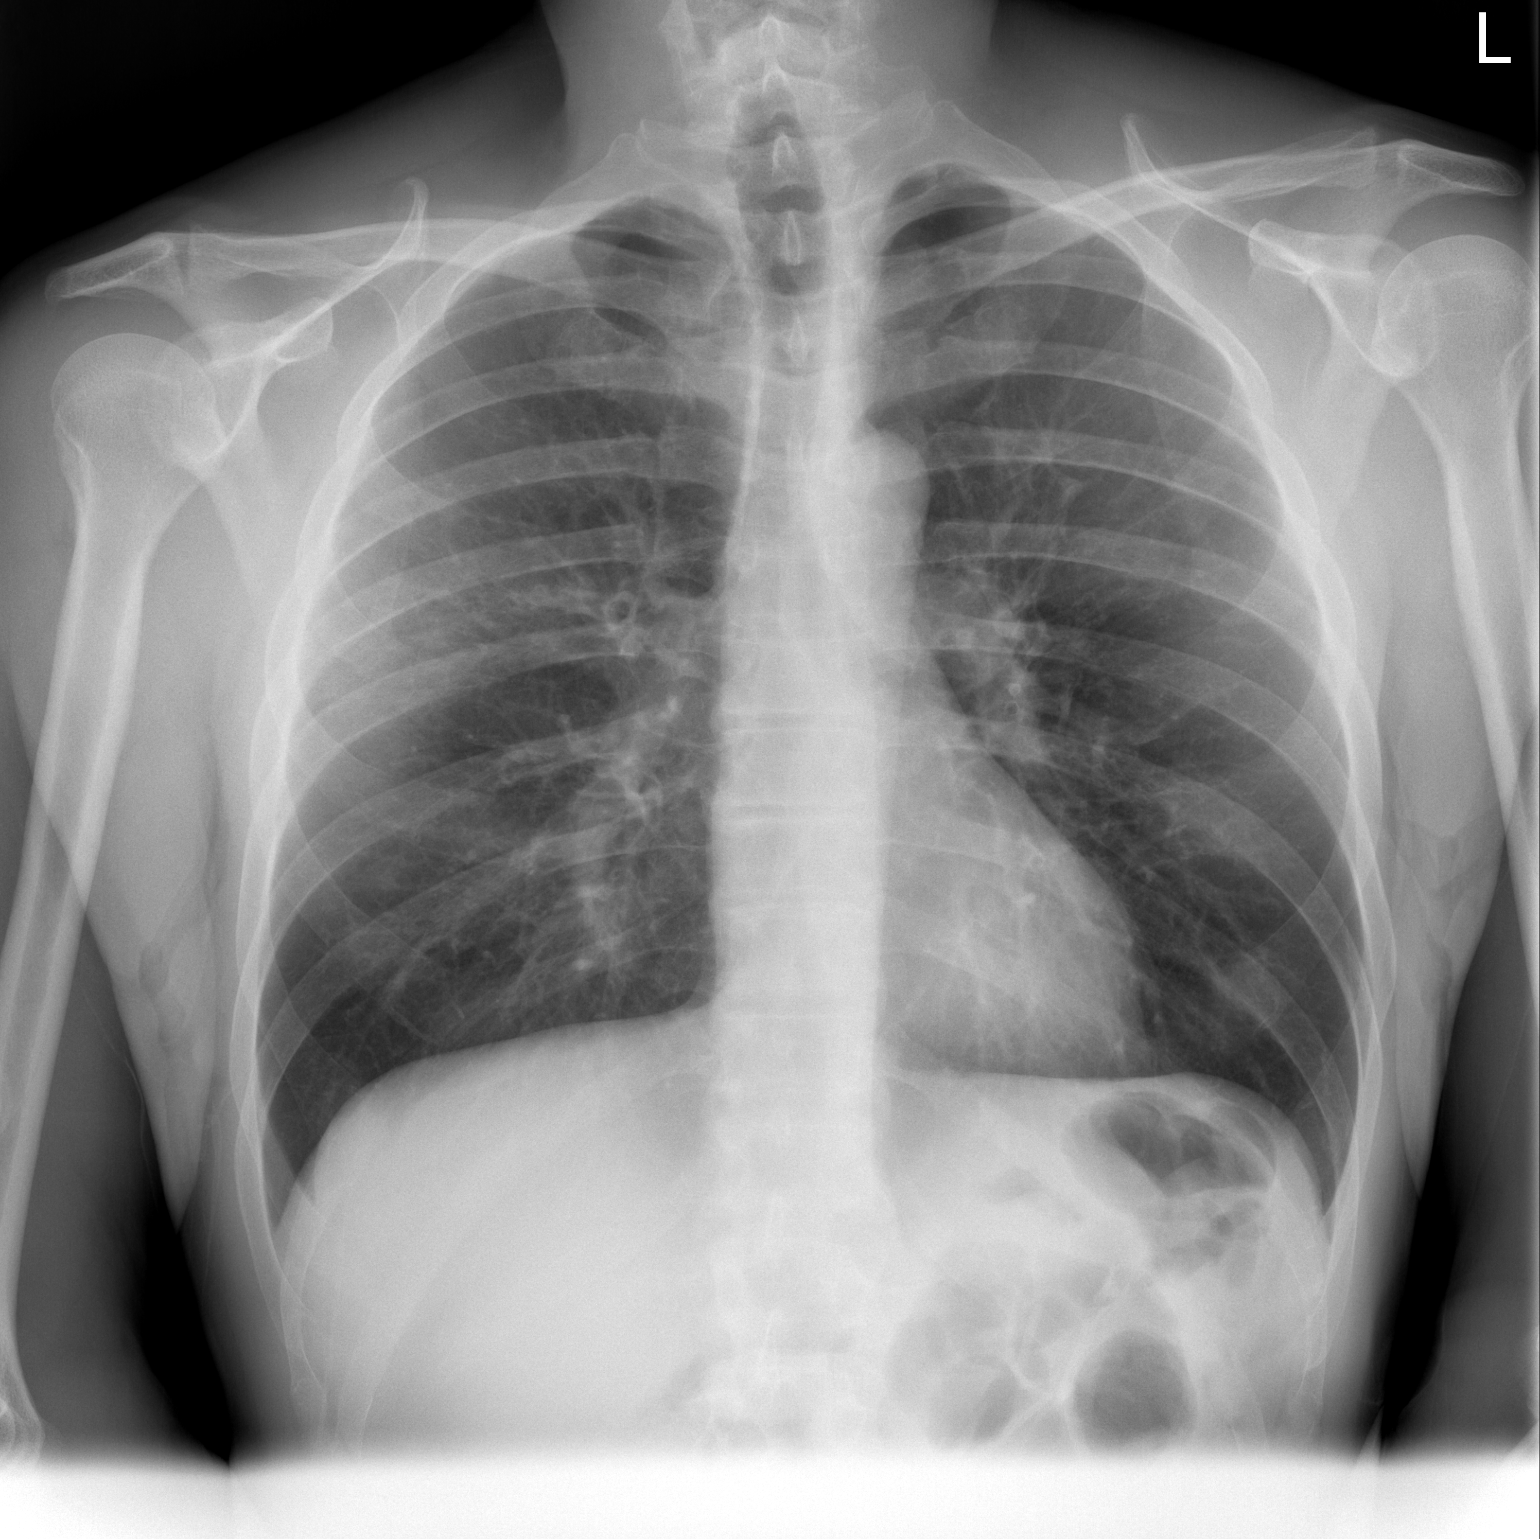

[w chest lat]
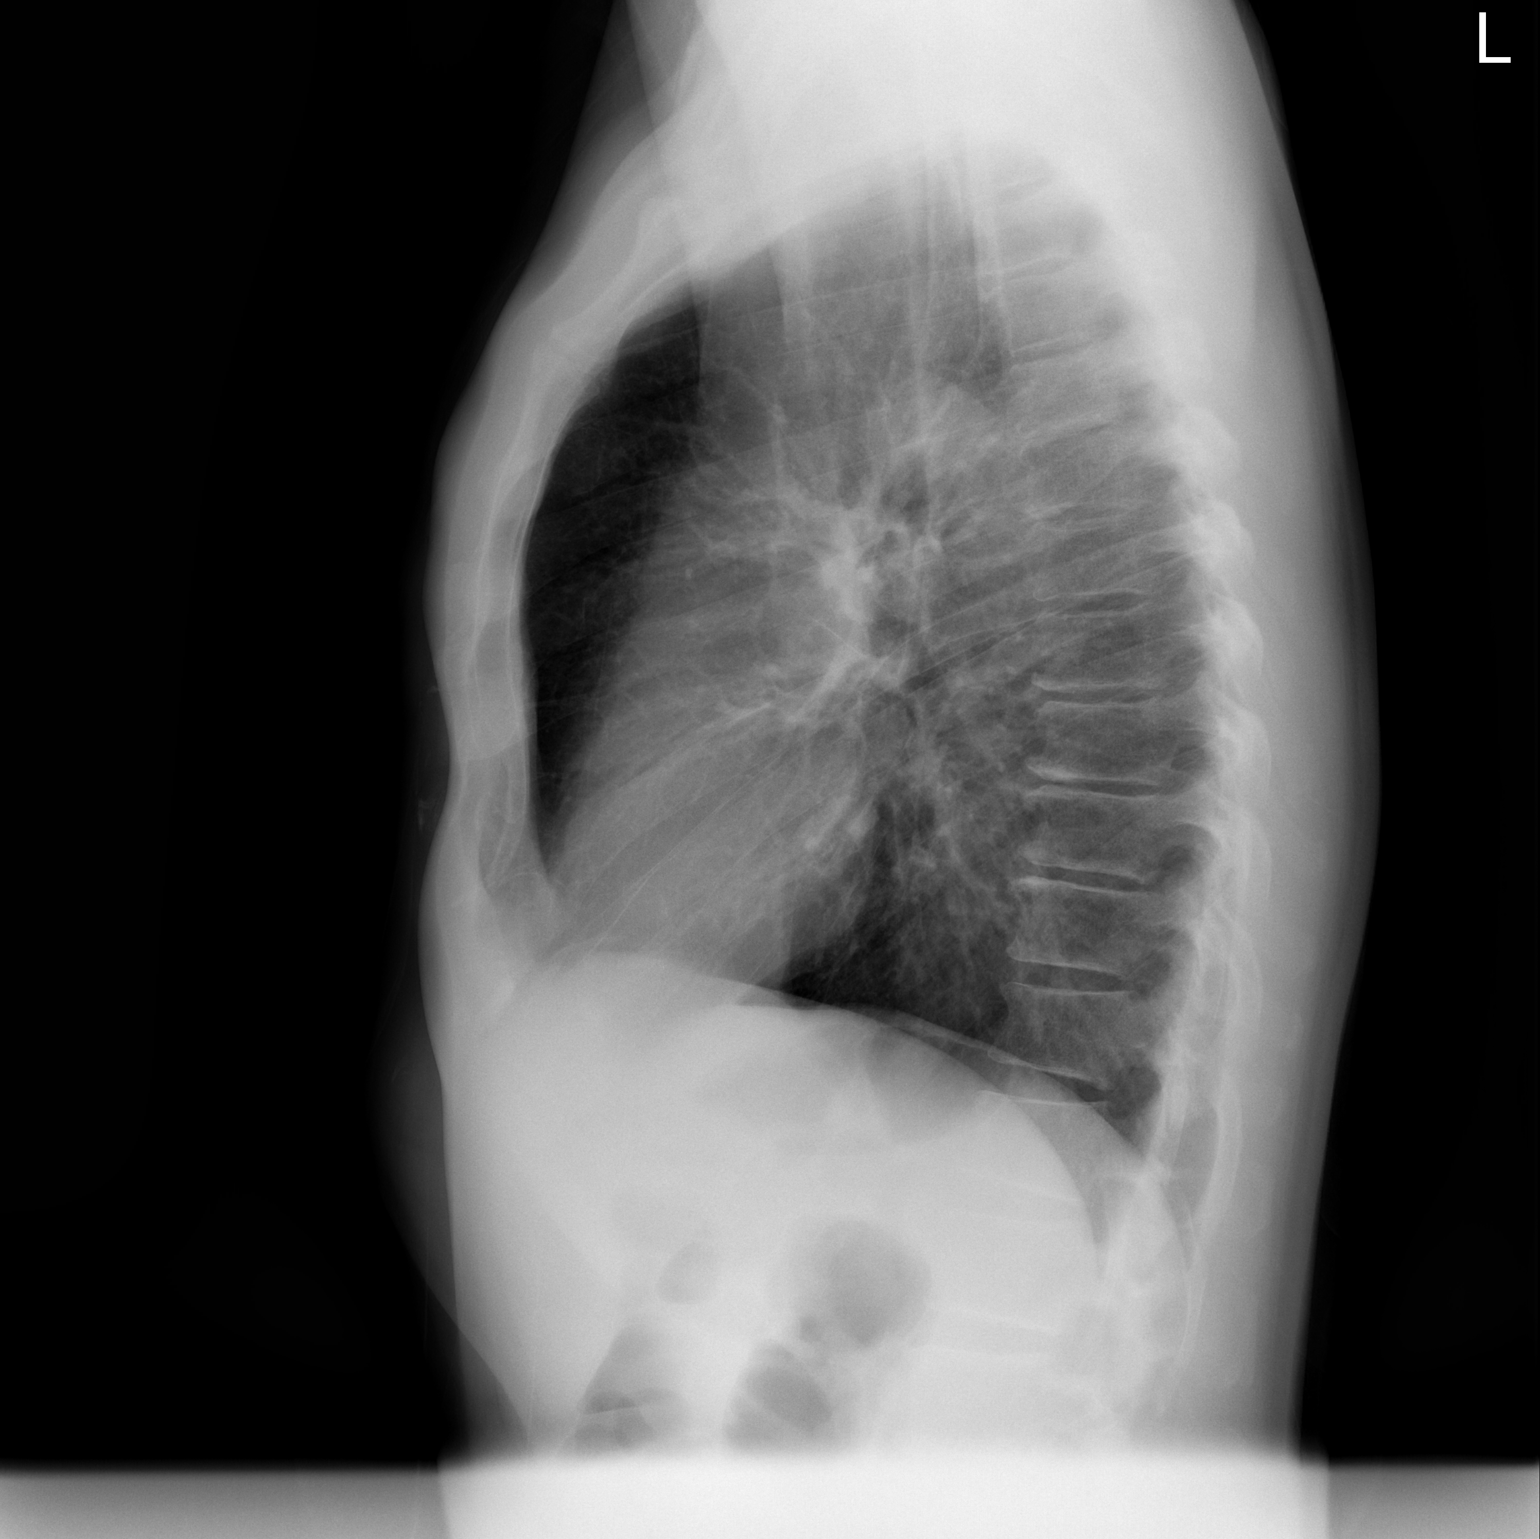

[2 of 2 positions shown; findings below may reference images not displayed]

FINDINGS: The heart size and mediastinal contours are within normal limits.
Both lungs are clear. No pneumothorax or pleural effusion is noted.
The visualized skeletal structures are unremarkable.
IMPRESSION: No active cardiopulmonary disease.

## 2020-01-19 ENCOUNTER — Encounter: Payer: Self-pay | Admitting: Family Medicine

## 2020-01-19 ENCOUNTER — Ambulatory Visit (INDEPENDENT_AMBULATORY_CARE_PROVIDER_SITE_OTHER): Payer: BC Managed Care – PPO | Admitting: Family Medicine

## 2020-01-19 ENCOUNTER — Other Ambulatory Visit: Payer: Self-pay | Admitting: Family Medicine

## 2020-01-19 ENCOUNTER — Other Ambulatory Visit: Payer: Self-pay

## 2020-01-19 VITALS — BP 145/79 | HR 86 | Temp 97.6°F | Resp 16 | Ht 65.0 in | Wt 153.4 lb

## 2020-01-19 DIAGNOSIS — E139 Other specified diabetes mellitus without complications: Secondary | ICD-10-CM

## 2020-01-19 DIAGNOSIS — I10 Essential (primary) hypertension: Secondary | ICD-10-CM

## 2020-01-19 DIAGNOSIS — E039 Hypothyroidism, unspecified: Secondary | ICD-10-CM

## 2020-01-19 DIAGNOSIS — F3342 Major depressive disorder, recurrent, in full remission: Secondary | ICD-10-CM | POA: Diagnosis not present

## 2020-01-19 NOTE — Progress Notes (Signed)
OFFICE VISIT  01/19/2020  CC:  Chief Complaint  Patient presents with  . Follow-up    [facility], pt is not fasting    HPI:    Patient is a 54 y.o. Caucasian male who presents for 1 mo f/u MDD as well as routine f/u DM, HTN, and hypothyroidism. A/P as of last visit: ") MDD, improved signif/in remission BUT has emotional blunting likely from being overmedicated. Will ween off wellbutrin: Take wellbutrin (bupropion) 150mg  ONE tab daily for 10d, then take ONE tab every other day for 10 doses. Continue citalopram 40mg  qd.  We'll see how things stand in 1 mo and see if any wean off citalopram is needed.  2) ED.  Had this prior to being on antidepressants, but seems to have gotten a little worse since meds. Trial of viagra 50mg , 1-2 qd prn. Therapeutic expectations and side effect profile of medication discussed today.  Patient's questions answered.  DM 2, Hypoth-->BMET, A1c to be done at f/u in 1 mo. He showed me on his glucometer today that his avg gluc in the last 30d is 140."  INTERIM HX: Says doing pretty good.  He was able to get off his wellbutrin w/out prob. Some covid/flu at home so he was staying with another family member for a while. Says he feels more like himself. Feels pretty stable regarding mood: no crying spells. No hopelessness.  No definite anhedonia.  Sleep is ok.  No excessive ruminating on worries.  Appetite is fine, wt is up 8 lbs in the last month. He has not tried viagra yet.  DM: fasting avg around 100.  Up to 190 later in the day. Lantus 16 U qAM, 5-6 U mealtime insulin tid.  No home bp monitoring.  Takes losartan 100mg  qd.  Hypoth: Takes T4 on empty stomach w/out any other meds.  ROS: no fevers, no CP, no SOB, no wheezing, no cough, no dizziness, no HAs, no rashes, no melena/hematochezia.  No polyuria or polydipsia.  No myalgias or arthralgias.  No focal weakness, paresthesias, or tremors.  No acute vision or hearing abnormalities. No n/v/d or abd pain.  No  palpitations.     Past Medical History:  Diagnosis Date  . Back pain    s/p back surgery  . Chronic hyponatremia 08/2012 onset   Onset was right after starting amitriptyline for his neuropathic leg pain--has been stable.  Likely ADH-like effect on kidney from amitriptyline.  History of low back pain 06/24/2011  . Hyperlipidemia   . Hypothyroidism   . LADA (latent autoimmune diabetes in adults), managed as type 1 (HCC) Dx'd 2014   Dr. 09/2012.  . Migraines   . Neuropathic pain of right lower extremity   . PLANTAR WART, RIGHT 10/09/2009  . Snoring    Sleep study neg of OSA 04/2018  . Tobacco dependence    quit 2012; restarted not long after    Past Surgical History:  Procedure Laterality Date  . BACK SURGERY  2009   lower lumbar fusion- rubber disc in back  . CARDIOVASCULAR STRESS TEST  03/21/14   No ischemia/low risk study; however, LV function showed EF 49% and global LV hypokinesis--echo recommended and it showed normal LV fxn.  05/2018 NASAL SINUS SURGERY  2007  . TRANSTHORACIC ECHOCARDIOGRAM  03/24/14   Normal LV fxn, EF 50-55%, no wall motion abnormalities: (grade 1 diast dysfxn)    Outpatient Medications Prior to Visit  Medication Sig Dispense Refill  . amitriptyline (ELAVIL) 25 MG tablet TAKE  ONE TABLET BY MOUTH EVERY MORNING AND TAKE THREE TABLETS at bedtime FOR right IN LEG PAIN 120 tablet 5  . atorvastatin (LIPITOR) 20 MG tablet Take 1 tablet (20 mg total) by mouth daily. 90 tablet 3  . citalopram (CELEXA) 40 MG tablet Take 1 tablet (40 mg total) by mouth daily. 90 tablet 3  . Continuous Blood Gluc Sensor (FREESTYLE LIBRE SENSOR SYSTEM) MISC USE 1 EACH BY DOES NOT APPLY ROUTE EVERY 10 DAYS.  99  . diclofenac Sodium (VOLTAREN) 1 % GEL Apply 2 g topically 4 (four) times daily. 100 g 3  . fluticasone (FLONASE) 50 MCG/ACT nasal spray Place 2 sprays into both nostrils daily. 16 g 3  . insulin aspart (NOVOLOG FLEXPEN) 100 UNIT/ML FlexPen 6 units qAC 15 mL 1  . Insulin Glargine  (LANTUS SOLOSTAR) 100 UNIT/ML Solostar Pen Inject 16 Units into the skin at bedtime.    Marland Kitchen losartan (COZAAR) 100 MG tablet Take 1 tablet (100 mg total) by mouth daily. 90 tablet 3  . pantoprazole (PROTONIX) 40 MG tablet TAKE ONE TABLET BY MOUTH EVERY DAY 30 tablet 5  . sildenafil (VIAGRA) 50 MG tablet 1-2 tabs po qd prn.  Take 30-60 min prior to intercourse. 10 tablet 6  . SURE COMFORT PEN NEEDLES 32G X 4 MM MISC USE FOUR TIMES DAILY AS NEEDED 100 each 10  . SYNTHROID 137 MCG tablet TAKE ONE TABLET BY MOUTH EVERY DAY BEFORE BREAKFAST 30 tablet 3  . tamsulosin (FLOMAX) 0.4 MG CAPS capsule Take 1 capsule (0.4 mg total) by mouth daily after supper. 90 capsule 3  . Bismuth Subsalicylate (PEPTO-BISMOL PO) Take by mouth as needed. (Patient not taking: Reported on 12/16/2019)    . buPROPion (WELLBUTRIN XL) 150 MG 24 hr tablet 1 tab po qd x 10d, then 1 tab po qod x 10 doses (Patient not taking: Reported on 01/19/2020) 20 tablet 0   No facility-administered medications prior to visit.    Allergies  Allergen Reactions  . Augmentin [Amoxicillin-Pot Clavulanate] Nausea And Vomiting  . Oxycodone-Acetaminophen     REACTION: itching  . Levothyroxine Rash    Had rash associated with generic levothyroxine, but not brand-name Synthroid.  Likely allergic to a dye or filler in the generic preparation.  . Lisinopril Cough    ROS As per HPI  PE: Vitals with BMI 01/19/2020 12/16/2019 11/04/2019  Height 5\' 5"  5\' 5"  5\' 5"   Weight 153 lbs 6 oz 145 lbs 13 oz 148 lbs 4 oz  BMI 25.53 24.26 24.67  Systolic 145 120  Diastolic 79 71 84  Pulse 86 88 78   Manual bp repeat at end of visit today was 120/72.  Gen: Alert, well appearing.  Patient is oriented to person, place, time, and situation. AFFECT: pleasant, lucid thought and speech. CV: RRR, no m/r/g.   LUNGS: CTA bilat, nonlabored resps, good aeration in all lung fields. EXT: no clubbing or cyanosis.  no edema.    LABS:  Lab Results  Component Value  Date   TSH 0.46 06/28/2019   Lab Results  Component Value Date   WBC 10.9 (H) 09/08/2019   HGB 13.1 09/08/2019   HCT 38.8 (L) 09/08/2019   MCV 93.0 09/08/2019   PLT 313.0 09/08/2019   Lab Results  Component Value Date   CREATININE 0.96 09/08/2019   BUN 9 09/08/2019   NA 128 (L) 09/08/2019   K 4.0 09/08/2019   CL 95 (L) 09/08/2019   CO2 27 09/08/2019   Lab  Results  Component Value Date   ALT 17 09/08/2019   AST 21 09/08/2019   ALKPHOS 120 (H) 09/08/2019   BILITOT 0.4 09/08/2019   Lab Results  Component Value Date   CHOL 113 12/24/2018   Lab Results  Component Value Date   HDL 57.00 12/24/2018   Lab Results  Component Value Date   LDLCALC 45 12/24/2018    Lab Results  Component Value Date   TRIG 54.0 12/24/2018   Lab Results  Component Value Date   CHOLHDL 2 12/24/2018   Lab Results  Component Value Date   PSA 0.23 12/24/2018   PSA 0.23 11/18/2017   PSA 0.26 11/14/2016   Lab Results  Component Value Date   HGBA1C 6.4 (A) 09/23/2019    IMPRESSION AND PLAN:  1) MDD, recurrent, in full remission and feeling better since getting off wellbutrin. Continue citalopram 40mg  qd.  2) DM 2: good control. Cont lantus qd and mealtime insulin tid. A1c and lytes/cr today.  3) HTN: initial bp up today, recheck manually was normal. Cont losartan 100mg  qd. BMET today.  4) Hypothyroidism: Taking synthroid 137 mcg qd correctly. TSH today.  An After Visit Summary was printed and given to the patient.  FOLLOW UP: Return in about 3 months (around 04/18/2020) for annual CPE (fasting) + f/u [facility].  Signed:  , MD           01/19/2020

## 2020-01-20 LAB — BASIC METABOLIC PANEL
BUN: 16 mg/dL (ref 6–23)
CO2: 30 mEq/L (ref 19–32)
Calcium: 9 mg/dL (ref 8.4–10.5)
Chloride: 88 mEq/L — ABNORMAL LOW (ref 96–112)
Creatinine, Ser: 1.04 mg/dL (ref 0.40–1.50)
GFR: 81.11 mL/min (ref >60.00)
Glucose, Bld: 164 mg/dL — ABNORMAL HIGH (ref 70–99)
Potassium: 4.3 mEq/L (ref 3.5–5.1)
Sodium: 126 mEq/L — ABNORMAL LOW (ref 135–145)

## 2020-01-20 LAB — TSH: TSH: 2.99 u[IU]/mL (ref 0.35–4.50)

## 2020-01-20 LAB — HEMOGLOBIN A1C: Hgb A1c MFr Bld: 7 % — ABNORMAL HIGH (ref 4.6–6.5)

## 2020-01-31 ENCOUNTER — Other Ambulatory Visit: Payer: Self-pay

## 2020-01-31 MED ORDER — AMITRIPTYLINE HCL 25 MG PO TABS: ORAL_TABLET | ORAL | 5 refills | Status: DC

## 2020-03-08 ENCOUNTER — Other Ambulatory Visit: Payer: Self-pay

## 2020-03-08 ENCOUNTER — Telehealth: Payer: Self-pay

## 2020-03-08 DIAGNOSIS — E119 Type 2 diabetes mellitus without complications: Secondary | ICD-10-CM

## 2020-03-08 MED ORDER — FREESTYLE LIBRE SENSOR SYSTEM MISC: 99 refills | Status: DC

## 2020-03-08 MED ORDER — FREESTYLE LIBRE SENSOR SYSTEM MISC
99 refills | Status: DC
Start: 2020-03-08 — End: 2020-03-08

## 2020-03-08 MED ORDER — FLUTICASONE PROPIONATE 50 MCG/ACT NA SUSP: 2.0000 | Freq: Every day | NASAL | 3 refills | Status: DC

## 2020-03-08 NOTE — Telephone Encounter (Signed)
Rx sent 

## 2020-03-08 NOTE — Telephone Encounter (Signed)
Patient refill request - pharmacy is stating he needs new prescription.  Continuous Blood Gluc Sensor (FREESTYLE LIBRE SENSOR SYSTEM) MISC [[id number]]   fluticasone (FLONASE) 50 MCG/ACT nasal spray [[id number]]    [facility]

## 2020-03-08 NOTE — Telephone Encounter (Signed)
Received call back from [facility] to ask if ok to change Rx from 10 to 14 day since they no longer make 10 day sensors. Patient would receive 2 sensors for the month. No other changes requested or needed. New Rx sent with changes.

## 2020-03-28 ENCOUNTER — Other Ambulatory Visit: Payer: Self-pay | Admitting: Family Medicine

## 2020-04-18 ENCOUNTER — Encounter: Payer: BC Managed Care – PPO | Admitting: Family Medicine

## 2020-05-02 ENCOUNTER — Other Ambulatory Visit: Payer: Self-pay

## 2020-05-03 ENCOUNTER — Telehealth: Payer: Self-pay

## 2020-05-03 ENCOUNTER — Ambulatory Visit (INDEPENDENT_AMBULATORY_CARE_PROVIDER_SITE_OTHER): Payer: BC Managed Care – PPO | Admitting: Family Medicine

## 2020-05-03 ENCOUNTER — Encounter: Payer: Self-pay | Admitting: Family Medicine

## 2020-05-03 VITALS — BP 112/69 | HR 86 | Temp 97.7°F | Resp 16 | Ht 64.0 in | Wt 148.0 lb

## 2020-05-03 DIAGNOSIS — E119 Type 2 diabetes mellitus without complications: Secondary | ICD-10-CM

## 2020-05-03 DIAGNOSIS — E871 Hypo-osmolality and hyponatremia: Secondary | ICD-10-CM

## 2020-05-03 DIAGNOSIS — Z1211 Encounter for screening for malignant neoplasm of colon: Secondary | ICD-10-CM

## 2020-05-03 DIAGNOSIS — M19041 Primary osteoarthritis, right hand: Secondary | ICD-10-CM

## 2020-05-03 DIAGNOSIS — E7800 Pure hypercholesterolemia, unspecified: Secondary | ICD-10-CM

## 2020-05-03 DIAGNOSIS — Z0000 Encounter for general adult medical examination without abnormal findings: Secondary | ICD-10-CM | POA: Diagnosis not present

## 2020-05-03 DIAGNOSIS — I10 Essential (primary) hypertension: Secondary | ICD-10-CM | POA: Diagnosis not present

## 2020-05-03 DIAGNOSIS — Z125 Encounter for screening for malignant neoplasm of prostate: Secondary | ICD-10-CM | POA: Diagnosis not present

## 2020-05-03 DIAGNOSIS — E039 Hypothyroidism, unspecified: Secondary | ICD-10-CM

## 2020-05-03 DIAGNOSIS — M19042 Primary osteoarthritis, left hand: Secondary | ICD-10-CM

## 2020-05-03 LAB — COMPREHENSIVE METABOLIC PANEL
ALT: 17 U/L (ref 0–53)
AST: 15 U/L (ref 0–37)
Albumin: 4.3 g/dL (ref 3.5–5.2)
Alkaline Phosphatase: 129 U/L — ABNORMAL HIGH (ref 39–117)
BUN: 11 mg/dL (ref 6–23)
CO2: 30 mEq/L (ref 19–32)
Calcium: 9 mg/dL (ref 8.4–10.5)
Chloride: 93 mEq/L — ABNORMAL LOW (ref 96–112)
Creatinine, Ser: 0.85 mg/dL (ref 0.40–1.50)
GFR: 97.96 mL/min (ref >60.00)
Glucose, Bld: 49 mg/dL — CL (ref 70–99)
Potassium: 4.1 mEq/L (ref 3.5–5.1)
Sodium: 130 mEq/L — ABNORMAL LOW (ref 135–145)
Total Bilirubin: 0.8 mg/dL (ref 0.2–1.2)
Total Protein: 6.5 g/dL (ref 6.0–8.3)

## 2020-05-03 LAB — CBC WITH DIFFERENTIAL/PLATELET
Basophils Absolute: 0.1 10*3/uL (ref 0.0–0.1)
Basophils Relative: 0.6 % (ref 0.0–3.0)
Eosinophils Absolute: 0.4 10*3/uL (ref 0.0–0.7)
Eosinophils Relative: 3.7 % (ref 0.0–5.0)
HCT: 41.6 % (ref 39.0–52.0)
Hemoglobin: 14.5 g/dL (ref 13.0–17.0)
Lymphocytes Relative: 15.3 % (ref 12.0–46.0)
Lymphs Abs: 1.5 10*3/uL (ref 0.7–4.0)
MCHC: 34.8 g/dL (ref 30.0–36.0)
MCV: 91.6 fl (ref 78.0–100.0)
Monocytes Absolute: 1 10*3/uL (ref 0.1–1.0)
Monocytes Relative: 10.1 % (ref 3.0–12.0)
Neutro Abs: 6.9 10*3/uL (ref 1.4–7.7)
Neutrophils Relative %: 70.3 % (ref 43.0–77.0)
Platelets: 269 10*3/uL (ref 150.0–400.0)
RBC: 4.54 Mil/uL (ref 4.22–5.81)
RDW: 13.5 % (ref 11.5–15.5)
WBC: 9.8 10*3/uL (ref 4.0–10.5)

## 2020-05-03 LAB — LIPID PANEL
Cholesterol: 107 mg/dL (ref 0–200)
HDL: 61.4 mg/dL (ref >39.00)
LDL Cholesterol: 38 mg/dL (ref 0–99)
NonHDL: 45.69
Total CHOL/HDL Ratio: 2
Triglycerides: 36 mg/dL (ref 0.0–149.0)
VLDL: 7.2 mg/dL (ref 0.0–40.0)

## 2020-05-03 LAB — PSA: PSA: 0.29 ng/mL (ref 0.10–4.00)

## 2020-05-03 LAB — HEMOGLOBIN A1C: Hgb A1c MFr Bld: 7.3 % — ABNORMAL HIGH (ref 4.6–6.5)

## 2020-05-03 MED ORDER — MELOXICAM 15 MG PO TABS
15.0000 mg | ORAL_TABLET | Freq: Every day | ORAL | 3 refills | Status: DC
Start: 2020-05-03 — End: 2020-07-28

## 2020-05-03 NOTE — Telephone Encounter (Signed)
Noted.  See result note attached to all labs from today.

## 2020-05-03 NOTE — Progress Notes (Signed)
Office Note 05/03/2020  CC:  Chief Complaint  Patient presents with  . Annual Exam    Fasting   HPI:  Daniel Stevenson is a 55 y.o. White male who is here for annual health maintenance exam and for 3 mo f/u MDD, DM, HTN, and hypothyroidism. A/P as of last visit: "1) MDD, recurrent, in full remission and feeling better since getting off wellbutrin. Continue citalopram 40mg  qd.  2) DM 2: good control. Cont lantus qd and mealtime insulin tid. A1c and lytes/cr today.  3) HTN: initial bp up today, recheck manually was normal. Cont losartan 100mg  qd. BMET today.  4) Hypothyroidism: Taking synthroid 137 mcg qd correctly. TSH today."  INTERIM HX: Mood stable, taking citalopram 40mg  qd.  Hypoth: Takes T4 on empty stomach w/out any other meds. HTN: no home monitoring.  TAking losartan 100mg  qd. 16 U lantus (this morning), also taking 6 U mealtime insulin. Fasting today. No burning, tingling, or numbness in feet.  Chronic, worsening bilat hands/fingers pain.  OTC voltaren gel minimal help. Builds furniture for a living, "hands take a beating". Occ takes aleve--unclear if helpful. No other joints symptomatic.  Past Medical History:  Diagnosis Date  . Back pain    s/p back surgery  . Chronic hyponatremia 08/2012 onset   Onset was right after starting amitriptyline for his neuropathic leg pain--has been stable.  Likely ADH-like effect on kidney from amitriptyline.  Marland Kitchen. History of low back pain 06/24/2011  . Hyperlipidemia   . Hypothyroidism   . LADA (latent autoimmune diabetes in adults), managed as type 1 (HCC) Dx'd 2014   Dr. Morrison [place].  . Migraines   . Neuropathic pain of right lower extremity   . PLANTAR WART, RIGHT 10/09/2009  . Snoring    Sleep study neg of OSA 04/2018  . Tobacco dependence    quit 2012; restarted not long after    Past Surgical History:  Procedure Laterality Date  . BACK SURGERY  2009   lower lumbar fusion- rubber disc in back  .  CARDIOVASCULAR STRESS TEST  03/21/14   No ischemia/low risk study; however, LV function showed EF 49% and global LV hypokinesis--echo recommended and it showed normal LV fxn.  Marland Kitchen. NASAL SINUS SURGERY  2007  . TRANSTHORACIC ECHOCARDIOGRAM  03/24/14   Normal LV fxn, EF 50-55%, no wall motion abnormalities: (grade 1 diast dysfxn)    Family History  Problem Relation Age of Onset  . Emphysema Mother        smoker  . Obesity Father     Social History   Socioeconomic History  . Marital status: Married    Spouse name: Not on file  . Number of children: Not on file  . Years of education: Not on file  . Highest education level: Not on file  Occupational History  . Not on file  Tobacco Use  . Smoking status: Current Every Day Smoker    Packs/day: 1.00    Years: 35.00    Pack years: 35.00    Types: Cigarettes    Last attempt to quit: 12/19/2010    Years since quitting: 9.3  . Smokeless tobacco: Never Used  Vaping Use  . Vaping Use: Never used  Substance and Sexual Activity  . Alcohol use: Yes    Comment: rarely  . Drug use: No  . Sexual activity: Not on file  Other Topics Concern  . Not on file  Social History Narrative   Married, 1 biologic child, 3 step children (all  grown).   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   Smoker: 1-2 ppd x 17 yrs.  Rare ETOH, no drugs.   No exercise.         Social Determinants of Health   Financial Resource Strain: Not on file  Food Insecurity: Not on file  Transportation Needs: Not on file  Physical Activity: Not on file  Stress: Not on file  Social Connections: Not on file  Intimate Partner Violence: Not on file    Outpatient Medications Prior to Visit  Medication Sig Dispense Refill  . amitriptyline (ELAVIL) 25 MG tablet TAKE ONE TABLET BY MOUTH EVERY MORNING AND TAKE THREE TABLETS at bedtime FOR right IN LEG PAIN 120 tablet 5  . atorvastatin (LIPITOR) 20 MG tablet Take 1 tablet (20 mg total) by mouth  daily. 90 tablet 3  . citalopram (CELEXA) 40 MG tablet Take 1 tablet (40 mg total) by mouth daily. 90 tablet 3  . Continuous Blood Gluc Sensor (FREESTYLE LIBRE SENSOR SYSTEM) MISC USE 1 EACH TO APPLY  EVERY 14 DAYS. 2 each 99  . fluticasone (FLONASE) 50 MCG/ACT nasal spray Place 2 sprays into both nostrils daily. 16 g 3  . insulin aspart (NOVOLOG FLEXPEN) 100 UNIT/ML FlexPen 6 units qAC 15 mL 1  . insulin glargine (LANTUS) 100 UNIT/ML Solostar Pen Inject 16 Units into the skin at bedtime.    Marland Kitchen losartan (COZAAR) 100 MG tablet Take 1 tablet (100 mg total) by mouth daily. 90 tablet 3  . pantoprazole (PROTONIX) 40 MG tablet TAKE ONE TABLET BY MOUTH EVERY DAY 30 tablet 5  . sildenafil (VIAGRA) 50 MG tablet 1-2 tabs po qd prn.  Take 30-60 min prior to intercourse. 10 tablet 6  . SURE COMFORT PEN NEEDLES 32G X 4 MM MISC USE FOUR TIMES DAILY AS NEEDED 100 each 10  . SYNTHROID 137 MCG tablet TAKE ONE TABLET BY MOUTH EVERY DAY BEFORE BREAKFAST 30 tablet 3  . tamsulosin (FLOMAX) 0.4 MG CAPS capsule Take 1 capsule (0.4 mg total) by mouth daily after supper. 90 capsule 3  . diclofenac Sodium (VOLTAREN) 1 % GEL Apply 2 g topically 4 (four) times daily. 100 g 3   No facility-administered medications prior to visit.    Allergies  Allergen Reactions  . Augmentin [Amoxicillin-Pot Clavulanate] Nausea And Vomiting  . Oxycodone-Acetaminophen     REACTION: itching  . Levothyroxine Rash    Had rash associated with generic levothyroxine, but not brand-name Synthroid.  Likely allergic to a dye or filler in the generic preparation.  . Lisinopril Cough    ROS Review of Systems  Constitutional: Negative for appetite change, chills, fatigue and fever.  HENT: Negative for congestion, dental problem, ear pain and sore throat.   Eyes: Negative for discharge, redness and visual disturbance.  Respiratory: Negative for cough, chest tightness, shortness of breath and wheezing.   Cardiovascular: Negative for chest pain,  palpitations and leg swelling.  Gastrointestinal: Negative for abdominal pain, blood in stool, diarrhea, nausea and vomiting.  Genitourinary: Negative for difficulty urinating, dysuria, flank pain, frequency, hematuria and urgency.  Musculoskeletal: Positive for arthralgias (bilat hands, chronic--as per hpi). Negative for back pain, joint swelling, myalgias and neck stiffness.  Skin: Negative for pallor and rash.  Neurological: Negative for dizziness, speech difficulty, weakness and headaches.  Hematological: Negative for adenopathy. Does not bruise/bleed easily.  Psychiatric/Behavioral: Negative for confusion and sleep disturbance. The patient is not nervous/anxious.     PE; Vitals with BMI 05/03/2020 01/19/2020  12/16/2019  Height 5\' 4"  5\' 5"  5\' 5"   Weight 148 lbs 153 lbs 6 oz 145 lbs 13 oz  BMI 25.39 25.53 24.26  Systolic 112 145  Diastolic 69 79 71  Pulse 86 86 88   Gen: Alert, well appearing.  Patient is oriented to person, place, time, and situation. AFFECT: pleasant, lucid thought and speech. ENT: Ears: EACs clear, normal epithelium.  TMs with good light reflex and landmarks bilaterally.  Eyes: no injection, icteris, swelling, or exudate.  EOMI, PERRLA. Nose: no drainage or turbinate edema/swelling.  No injection or focal lesion.  Mouth: lips without lesion/swelling.  Oral mucosa pink and moist.  Dentition intact and without obvious caries or gingival swelling.  Oropharynx without erythema, exudate, or swelling.  Neck: supple/nontender.  No LAD, mass, or TM.  Carotid pulses 2+ bilaterally, without bruits. CV: RRR, no m/r/g.   LUNGS: CTA bilat, nonlabored resps, good aeration in all lung fields. ABD: soft, NT, ND, BS normal.  No hepatospenomegaly or mass.  No bruits. EXT: no clubbing, cyanosis, or edema.  Musculoskeletal:  All joints of both hands/all fingers with mild bony hypertrophy but no erythema or tenderness.  ROM intact.  Otherwise-> no joint swelling, erythema, warmth, or  tenderness.  ROM of all joints intact. Skin - no sores or suspicious lesions or rashes or color changes   Pertinent labs:  Lab Results  Component Value Date   TSH 2.99 01/19/2020   Lab Results  Component Value Date   WBC 10.9 (H) 09/08/2019   HGB 13.1 09/08/2019   HCT 38.8 (L) 09/08/2019   MCV 93.0 09/08/2019   PLT 313.0 09/08/2019   Lab Results  Component Value Date   CREATININE 1.04 01/19/2020   BUN 16 01/19/2020   NA 126 (L) 01/19/2020   K 4.3 01/19/2020   CL 88 (L) 01/19/2020   CO2 30 01/19/2020   Lab Results  Component Value Date   ALT 17 09/08/2019   AST 21 09/08/2019   ALKPHOS 120 (H) 09/08/2019   BILITOT 0.4 09/08/2019   Lab Results  Component Value Date   CHOL 113 12/24/2018   Lab Results  Component Value Date   HDL 57.00 12/24/2018   Lab Results  Component Value Date   LDLCALC 45 12/24/2018   Lab Results  Component Value Date   TRIG 54.0 12/24/2018   Lab Results  Component Value Date   CHOLHDL 2 12/24/2018   Lab Results  Component Value Date   PSA 0.23 12/24/2018   PSA 0.23 11/18/2017   PSA 0.26 11/14/2016   Lab Results  Component Value Date   HGBA1C 7.0 (H) 01/19/2020   ASSESSMENT AND PLAN:   1) DM: doing well on basal/bolus regimen with lantus and novolog. a1c and lytes/cr today. Feet exam normal today.  2) HTN: stable on losartan 100 mg qd. Lytes/cr today.  3) Hypothyroidism: TSH 3 mo ago and 10 mo ago normal.  Plan rpt TSH 50mo.  4) MDD: stable on citalopram 40mg  qd.  5) Health maintenance exam: Reviewed age and gender appropriate health maintenance issues (prudent diet, regular exercise, health risks of tobacco and excessive alcohol, use of seatbelts, fire alarms in home, use of sunscreen).  Also reviewed age and gender appropriate health screening as well as vaccine recommendations. Vaccines: Shingrix->declines for now.  Otherwise ALL UTD. Labs: cbc, cmet, flp, a1c. Prostate ca screening: PSA ordered. Colon ca screening:  pt has not had screening yet->referral to GI ordered today.  6) Osteoarthritis, hands. Failed  voltaren gel trial. D/c voltaren gel and start meloxicam 15mg  qd prn.  Therapeutic expectations and side effect profile of medication discussed today.  Patient's questions answered.  An After Visit Summary was printed and given to the patient.  FOLLOW UP:  Return in about 3 months (around 08/03/2020) for routine chronic illness f/u.  Signed:  08/05/2020, MD           05/03/2020

## 2020-05-03 NOTE — Telephone Encounter (Signed)
CRITICAL VALUE STICKER  CRITICAL VALUE: Glucose, 49  RECEIVER (on-site recipient of call): Burns Spain  DATE & TIME NOTIFIED: 05/03/20, 3:39PM  MESSENGER (representative from lab): Desma Paganini MD NOTIFIED: Dr.McGowen  TIME OF NOTIFICATION: 05/03/20, 3:39PM  RESPONSE: Please advise, thanks.

## 2020-05-04 NOTE — Telephone Encounter (Signed)
Spoke with patient regarding results

## 2020-05-26 ENCOUNTER — Other Ambulatory Visit: Payer: Self-pay | Admitting: Family Medicine

## 2020-06-26 ENCOUNTER — Other Ambulatory Visit: Payer: Self-pay | Admitting: Family Medicine

## 2020-07-28 ENCOUNTER — Other Ambulatory Visit: Payer: Self-pay | Admitting: Family Medicine

## 2020-08-03 ENCOUNTER — Ambulatory Visit: Payer: BC Managed Care – PPO | Admitting: Family Medicine

## 2020-08-03 ENCOUNTER — Encounter: Payer: Self-pay | Admitting: Family Medicine

## 2020-08-03 ENCOUNTER — Other Ambulatory Visit: Payer: Self-pay

## 2020-08-03 VITALS — BP 132/75 | HR 87 | Temp 97.8°F | Resp 16 | Ht 64.0 in | Wt 148.8 lb

## 2020-08-03 DIAGNOSIS — M19041 Primary osteoarthritis, right hand: Secondary | ICD-10-CM

## 2020-08-03 DIAGNOSIS — I10 Essential (primary) hypertension: Secondary | ICD-10-CM

## 2020-08-03 DIAGNOSIS — E039 Hypothyroidism, unspecified: Secondary | ICD-10-CM | POA: Diagnosis not present

## 2020-08-03 DIAGNOSIS — E1029 Type 1 diabetes mellitus with other diabetic kidney complication: Secondary | ICD-10-CM | POA: Diagnosis not present

## 2020-08-03 DIAGNOSIS — R809 Proteinuria, unspecified: Secondary | ICD-10-CM

## 2020-08-03 DIAGNOSIS — Z23 Encounter for immunization: Secondary | ICD-10-CM | POA: Diagnosis not present

## 2020-08-03 DIAGNOSIS — E7800 Pure hypercholesterolemia, unspecified: Secondary | ICD-10-CM

## 2020-08-03 DIAGNOSIS — E139 Other specified diabetes mellitus without complications: Secondary | ICD-10-CM

## 2020-08-03 DIAGNOSIS — F411 Generalized anxiety disorder: Secondary | ICD-10-CM

## 2020-08-03 DIAGNOSIS — M19042 Primary osteoarthritis, left hand: Secondary | ICD-10-CM

## 2020-08-03 MED ORDER — ATORVASTATIN CALCIUM 20 MG PO TABS: 20.0000 mg | ORAL_TABLET | Freq: Every day | ORAL | 3 refills | Status: DC

## 2020-08-03 MED ORDER — BUPROPION HCL ER (XL) 150 MG PO TB24
150.0000 mg | ORAL_TABLET | Freq: Every day | ORAL | 3 refills | Status: DC
Start: 2020-08-03 — End: 2021-04-25

## 2020-08-03 MED ORDER — FREESTYLE LIBRE SENSOR SYSTEM MISC: 99 refills | Status: DC

## 2020-08-03 NOTE — Progress Notes (Signed)
OFFICE VISIT  08/03/2020  CC:  Chief Complaint  Patient presents with   Follow-up    RCI, 3 mo.    HPI:    Patient is a 55 y.o. Caucasian male who presents for 3 mo f/u DM 2, HTN, hypothyroidism. A/P as of last visit: "1) DM: doing well on basal/bolus regimen with lantus and novolog. a1c and lytes/cr today. Feet exam normal today.   2) HTN: stable on losartan 100 mg qd. Lytes/cr today.   3) Hypothyroidism: TSH 3 mo ago and 10 mo ago normal.  Plan rpt TSH 34mo.   4) MDD: stable on citalopram 40mg  qd.   5) Health maintenance exam: Reviewed age and gender appropriate health maintenance issues (prudent diet, regular exercise, health risks of tobacco and excessive alcohol, use of seatbelts, fire alarms in home, use of sunscreen).  Also reviewed age and gender appropriate health screening as well as vaccine recommendations. Vaccines: Shingrix->declines for now.  Otherwise ALL UTD. Labs: cbc, cmet, flp, a1c. Prostate ca screening: PSA ordered. Colon ca screening: pt has not had screening yet->referral to GI ordered today.   6) Osteoarthritis, hands. Failed voltaren gel trial. D/c voltaren gel and start meloxicam 15mg  qd prn.  Therapeutic expectations and side effect profile of medication discussed today.  Patient's questions answered."  INTERIM HX: Doing ok. Diet not as good lately b/c diff work schedule is necessitating eating out more.  Avg 150s last couple weeks. 16 U lantus, novolog 6 qAC.  Shooting for about 70g carbs each meal.  His hand arthritis pain is signif improved on meloxicam 15 qd---achig and stiffness much better.  Hypoth: Takes T4 on empty stomach w/out any other meds.  HTN: no home bp monitoring.  Compliant with losartan 100 qd.  Long hx of GAD, some recurrent MDD.  Had been doing very well long term on wellbutrin and citalopram so we got him off the wellbutrin.  He now says the last couple months he has dealt with more anxiety and irritability, feeling  keyed up more.  Denies depressed mood.  No panic attacks.  ROS as above, plus--> no fevers, no CP, no SOB, no wheezing, no cough, no dizziness, no HAs, no rashes, no melena/hematochezia.  No polyuria or polydipsia.  No myalgias.  No focal weakness, paresthesias, or tremors.  No acute vision or hearing abnormalities.  No dysuria or unusual/new urinary urgency or frequency.  No recent changes in lower legs. No n/v/d or abd pain.  No palpitations.    Past Medical History:  Diagnosis Date   Back pain    s/p back surgery   Chronic hyponatremia 08/2012 onset   Onset was right after starting amitriptyline for his neuropathic leg pain--has been stable.  Likely ADH-like effect on kidney from amitriptyline.   History of low back pain 06/24/2011   Hyperlipidemia    Hypothyroidism    LADA (latent autoimmune diabetes in adults), managed as type 1 [facility]) Dx'd 2014   [facility].   Migraines    Neuropathic pain of right lower extremity    PLANTAR WART, RIGHT 10/09/2009   Snoring    Sleep study neg of OSA 04/2018   Tobacco dependence    quit 2012; restarted not long after    Past Surgical History:  Procedure Laterality Date   BACK SURGERY  2009   lower lumbar fusion- rubber disc in back   CARDIOVASCULAR STRESS TEST  03/21/14   No ischemia/low risk study; however, LV function showed EF 49% and global LV hypokinesis--echo  recommended and it showed normal LV fxn.   NASAL SINUS SURGERY  2007   TRANSTHORACIC ECHOCARDIOGRAM  03/24/14   Normal LV fxn, EF 50-55%, no wall motion abnormalities: (grade 1 diast dysfxn)    Outpatient Medications Prior to Visit  Medication Sig Dispense Refill   amitriptyline (ELAVIL) 25 MG tablet TAKE ONE TABLET BY MOUTH EVERY MORNING AND TAKE THREE TABLETS at bedtime FOR right IN LEG PAIN 120 tablet 5   citalopram (CELEXA) 40 MG tablet Take 1 tablet (40 mg total) by mouth daily. 90 tablet 3   fluticasone (FLONASE) 50 MCG/ACT nasal spray Place 2 sprays into both nostrils daily. 16  g 3   insulin aspart (NOVOLOG FLEXPEN) 100 UNIT/ML FlexPen 6 units qAC 15 mL 1   insulin glargine (LANTUS) 100 UNIT/ML Solostar Pen Inject 16 Units into the skin at bedtime.     losartan (COZAAR) 100 MG tablet Take 1 tablet (100 mg total) by mouth daily. 90 tablet 3   meloxicam (MOBIC) 15 MG tablet TAKE ONE TABLET BY MOUTH EVERY DAY 30 tablet 0   pantoprazole (PROTONIX) 40 MG tablet TAKE ONE TABLET BY MOUTH EVERY DAY 30 tablet 5   sildenafil (VIAGRA) 50 MG tablet 1-2 tabs po qd prn.  Take 30-60 min prior to intercourse. 10 tablet 6   SURE COMFORT PEN NEEDLES 32G X 4 MM MISC USE FOUR TIMES DAILY AS NEEDED 100 each 10   SYNTHROID 137 MCG tablet TAKE ONE TABLET BY MOUTH EVERY DAY BEFORE BREAKFAST 30 tablet 0   tamsulosin (FLOMAX) 0.4 MG CAPS capsule Take 1 capsule (0.4 mg total) by mouth daily after supper. 90 capsule 3   atorvastatin (LIPITOR) 20 MG tablet Take 1 tablet (20 mg total) by mouth daily. 90 tablet 3   Continuous Blood Gluc Sensor (FREESTYLE LIBRE SENSOR SYSTEM) MISC USE 1 EACH TO APPLY  EVERY 14 DAYS. 2 each 99   No facility-administered medications prior to visit.    Allergies  Allergen Reactions   Augmentin [Amoxicillin-Pot Clavulanate] Nausea And Vomiting   Oxycodone-Acetaminophen     REACTION: itching   Levothyroxine Rash    Had rash associated with generic levothyroxine, but not brand-name Synthroid.  Likely allergic to a dye or filler in the generic preparation.   Lisinopril Cough    ROS As per HPI  PE: Vitals with BMI 08/03/2020 05/03/2020 01/19/2020  Height 5\' 4"  5\' 4"  5\' 5"   Weight 148 lbs 13 oz 148 lbs 153 lbs 6 oz  BMI 25.53 25.39 25.53  Systolic 132 112  Diastolic 75 69 79  Pulse 87 86 86    Gen: Alert, well appearing.  Patient is oriented to person, place, time, and situation. AFFECT: pleasant, lucid thought and speech. CV: RRR, no m/r/g.   LUNGS: CTA bilat, nonlabored resps, good aeration in all lung fields. EXT: no clubbing or cyanosis.  no edema.     LABS:  Lab Results  Component Value Date   TSH 2.99 01/19/2020   Lab Results  Component Value Date   WBC 9.8 05/03/2020   HGB 14.5 05/03/2020   HCT 41.6 05/03/2020   MCV 91.6 05/03/2020   PLT 269.0 05/03/2020   Lab Results  Component Value Date   CREATININE 0.85 05/03/2020   BUN 11 05/03/2020   NA 130 (L) 05/03/2020   K 4.1 05/03/2020   CL 93 (L) 05/03/2020   CO2 30 05/03/2020   Lab Results  Component Value Date   ALT 17 05/03/2020   AST 15  05/03/2020   ALKPHOS 129 (H) 05/03/2020   BILITOT 0.8 05/03/2020   Lab Results  Component Value Date   CHOL 107 05/03/2020   Lab Results  Component Value Date   HDL 61.40 05/03/2020   Lab Results  Component Value Date   LDLCALC 38 05/03/2020   Lab Results  Component Value Date   TRIG 36.0 05/03/2020   Lab Results  Component Value Date   CHOLHDL 2 05/03/2020   Lab Results  Component Value Date   PSA 0.29 05/03/2020   PSA 0.23 12/24/2018   PSA 0.23 11/18/2017   Lab Results  Component Value Date   HGBA1C 7.3 (H) 05/03/2020    IMPRESSION AND PLAN:  1) LADA managed as type I.  Hx of very mild microalbuminuria. Doing well except last couple weeks diet not as good and glucoses up into 150s avg. Bmet, a1c, and microalb/cr today. Cont 16 U lantus qd and 6 U novolog qAC.  2) HTN: stable on losartan 100 qd. Lytes/cr today.  3) Hypothyroidism: TSH monitoring today.  4) HLD: doing well on atorvastatin 20mg  qd (ran out 1 mo ago). LDL 8 three mo ago-->plan rpt lipids and hepatic panel 3 mo.  5) GAD, hx of recurrent MDD. GAD not ideally controlled and we'll get him back on wellbutrin xl 150 qd. Continue citalopram 40 qd.  6) Bilat hands osteoarthritis: good response to 15mg  qd.  7) Preventative health care: Prevnar 20 today. He's still considering shingrix.  An After Visit Summary was printed and given to the patient.  FOLLOW UP: Return in about 3 months (around 11/03/2020) for routine chronic illness  f/u. Next cpe 04/2022  Signed:  11/05/2020, MD           08/03/2020

## 2020-08-03 NOTE — Addendum Note (Signed)
Addended by: Paschal Dopp on: 08/03/2020 04:11 PM   Modules accepted: Orders

## 2020-08-04 LAB — BASIC METABOLIC PANEL
BUN: 22 mg/dL (ref 6–23)
CO2: 27 mEq/L (ref 19–32)
Calcium: 9.3 mg/dL (ref 8.4–10.5)
Chloride: 92 mEq/L — ABNORMAL LOW (ref 96–112)
Creatinine, Ser: 1.09 mg/dL (ref 0.40–1.50)
GFR: 76.38 mL/min (ref >60.00)
Glucose, Bld: 60 mg/dL — ABNORMAL LOW (ref 70–99)
Potassium: 4.1 mEq/L (ref 3.5–5.1)
Sodium: 128 mEq/L — ABNORMAL LOW (ref 135–145)

## 2020-08-04 LAB — HEMOGLOBIN A1C: Hgb A1c MFr Bld: 7.4 % — ABNORMAL HIGH (ref 4.6–6.5)

## 2020-08-04 LAB — MICROALBUMIN / CREATININE URINE RATIO
Creatinine,U: 68 mg/dL
Microalb Creat Ratio: 3.7 mg/g (ref 0.0–30.0)
Microalb, Ur: 2.5 mg/dL — ABNORMAL HIGH (ref 0.0–1.9)

## 2020-08-04 LAB — TSH: TSH: 2.95 u[IU]/mL (ref 0.35–4.50)

## 2020-08-15 ENCOUNTER — Other Ambulatory Visit: Payer: Self-pay

## 2020-08-15 MED ORDER — INSULIN GLARGINE 100 UNIT/ML SOLOSTAR PEN: 16.0000 [IU] | PEN_INJECTOR | Freq: Every day | SUBCUTANEOUS | 1 refills | Status: DC

## 2020-08-15 MED ORDER — NOVOLOG FLEXPEN 100 UNIT/ML ~~LOC~~ SOPN: PEN_INJECTOR | SUBCUTANEOUS | 1 refills | Status: DC

## 2020-08-16 ENCOUNTER — Telehealth: Payer: Self-pay

## 2020-08-16 NOTE — Telephone Encounter (Signed)
PA completed for Lantus SoloStar 100Unit/mL, denied. Pt has been made aware of denial.  Alternative medications include: Toujeo, Levemir, and [place].  Please advise, thanks.

## 2020-08-18 MED ORDER — TOUJEO SOLOSTAR 300 UNIT/ML ~~LOC~~ SOPN: PEN_INJECTOR | SUBCUTANEOUS | 4 refills | Status: DC

## 2020-08-18 NOTE — Addendum Note (Signed)
Addended by: Jeoffrey Massed on: 08/18/2020 03:18 PM   Modules accepted: Orders

## 2020-08-18 NOTE — Telephone Encounter (Signed)
Pt made aware rx sent and continue with Novolog as well.

## 2020-08-18 NOTE — Telephone Encounter (Signed)
[name] eRx'd--notify pt that it is the same dose as he was doing with the lantus.

## 2020-09-03 ENCOUNTER — Encounter: Payer: Self-pay | Admitting: Family Medicine

## 2020-09-03 DIAGNOSIS — U071 COVID-19: Secondary | ICD-10-CM

## 2020-09-03 HISTORY — DX: COVID-19: U07.1

## 2020-09-04 ENCOUNTER — Telehealth (INDEPENDENT_AMBULATORY_CARE_PROVIDER_SITE_OTHER): Payer: BC Managed Care – PPO | Admitting: Family Medicine

## 2020-09-04 ENCOUNTER — Encounter: Payer: Self-pay | Admitting: Family Medicine

## 2020-09-04 DIAGNOSIS — U071 COVID-19: Secondary | ICD-10-CM

## 2020-09-04 DIAGNOSIS — E139 Other specified diabetes mellitus without complications: Secondary | ICD-10-CM

## 2020-09-04 MED ORDER — MOLNUPIRAVIR EUA 200MG CAPSULE: 4.0000 | ORAL_CAPSULE | Freq: Two times a day (BID) | ORAL | 0 refills | Status: AC

## 2020-09-04 NOTE — Progress Notes (Signed)
Virtual Visit via Video Note  I connected with on 09/04/20 at  2:30 PM EDT by a video enabled telemedicine application 2/2 COVID-19 pandemic and verified that I am speaking with the correct person using two identifiers.  Location patient: home Location provider:work or home office Persons participating in the virtual visit: patient, provider  I discussed the limitations of evaluation and management by telemedicine and the availability of in person appointments. The patient expressed understanding and agreed to proceed.  Chief Complaint  Patient presents with   Covid Positive    Tested yesterday, has headaches, body aches, sinus congestion, isa diabetic. Has tried aleve and BC powders    HPI: Pt tested positive for COVID yesterday 09/04/20.  Symptoms started yesterday with fever Tmax 100F, cough, chills, HA, body aches, congestion, decreased appetite.  Pt denies sick contacts, n/v, diarrhea.  Had last covid booster 7/19.  Took Aleeve and BC powder.  BS 151. Pt ha LADA managed as DM I.   ROS: See pertinent positives and negatives per HPI.  Past Medical History:  Diagnosis Date   Back pain    s/p back surgery   Chronic hyponatremia 08/2012 onset   Onset was right after starting amitriptyline for his neuropathic leg pain--has been stable.  Likely ADH-like effect on kidney from amitriptyline.   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 Morrison Old.   Migraines    Neuropathic pain of right lower extremity    PLANTAR WART, RIGHT 10/09/2009   Snoring    Sleep study neg of OSA 04/2018   Tobacco dependence    quit 2012; restarted not long after    Past Surgical History:  Procedure Laterality Date   BACK SURGERY  2009   lower lumbar fusion- rubber disc in back   CARDIOVASCULAR STRESS TEST  03/21/14   No ischemia/low risk study; however, LV function showed EF 49% and global LV  hypokinesis--echo recommended and it showed normal LV fxn.   NASAL SINUS SURGERY  2007   TRANSTHORACIC ECHOCARDIOGRAM  03/24/14   Normal LV fxn, EF 50-55%, no wall motion abnormalities: (grade 1 diast dysfxn)    Family History  Problem Relation Age of Onset   Emphysema Mother        smoker   Obesity Father     Current Outpatient Medications:    amitriptyline (ELAVIL) 25 MG tablet, TAKE ONE TABLET BY MOUTH EVERY MORNING AND TAKE THREE TABLETS at bedtime FOR right IN LEG PAIN, Disp: 120 tablet, Rfl: 5   atorvastatin (LIPITOR) 20 MG tablet, Take 1 tablet (20 mg total) by mouth daily., Disp: 90 tablet, Rfl: 3   buPROPion (WELLBUTRIN XL) 150 MG 24 hr tablet, Take 1 tablet (150 mg total) by mouth daily., Disp: 90 tablet, Rfl: 3   citalopram (CELEXA) 40 MG tablet, Take 1 tablet (40 mg total) by mouth daily., Disp: 90 tablet, Rfl: 3   Continuous Blood Gluc Sensor (FREESTYLE LIBRE SENSOR SYSTEM) MISC, USE 1 EACH TO APPLY  EVERY 14 DAYS., Disp: 2 each, Rfl: 99   fluticasone (FLONASE) 50 MCG/ACT nasal spray, Place 2 sprays into both nostrils daily., Disp: 16 g, Rfl: 3   insulin aspart (NOVOLOG FLEXPEN) 100 UNIT/ML FlexPen, 6 units qAC, Disp: 15 mL, Rfl: 1   insulin glargine, 1 Unit Dial, (TOUJEO SOLOSTAR) 300 UNIT/ML Solostar Pen, 16 U SQ qd, Disp: 3 mL, Rfl:  4   losartan (COZAAR) 100 MG tablet, Take 1 tablet (100 mg total) by mouth daily., Disp: 90 tablet, Rfl: 3   meloxicam (MOBIC) 15 MG tablet, TAKE ONE TABLET BY MOUTH EVERY DAY, Disp: 30 tablet, Rfl: 0   pantoprazole (PROTONIX) 40 MG tablet, TAKE ONE TABLET BY MOUTH EVERY DAY, Disp: 30 tablet, Rfl: 5   sildenafil (VIAGRA) 50 MG tablet, 1-2 tabs po qd prn.  Take 30-60 min prior to intercourse., Disp: 10 tablet, Rfl: 6   SURE COMFORT PEN NEEDLES 32G X 4 MM MISC, USE FOUR TIMES DAILY AS NEEDED, Disp: 100 each, Rfl: 10   SYNTHROID 137 MCG tablet, TAKE ONE TABLET BY MOUTH EVERY DAY BEFORE BREAKFAST, Disp: 30 tablet, Rfl: 0   tamsulosin (FLOMAX) 0.4 MG  CAPS capsule, Take 1 capsule (0.4 mg total) by mouth daily after supper., Disp: 90 capsule, Rfl: 3  EXAM:  VITALS per patient if applicable:  RR between 12-20 bpm  GENERAL: alert, oriented, appears well and in no acute distress  HEENT: atraumatic, conjunctiva clear, no obvious abnormalities on inspection of external nose and ears  NECK: normal movements of the head and neck  LUNGS: on inspection no signs of respiratory distress, breathing rate appears normal, no obvious gross SOB, gasping or wheezing  CV: no obvious cyanosis  MS: moves all visible extremities without noticeable abnormality  PSYCH/NEURO: pleasant and cooperative, no obvious depression or anxiety, speech and thought processing grossly intact  ASSESSMENT AND PLAN:  Discussed the following assessment and plan:  COVID-19 virus infection  -symptoms starting and positive at home test on 09/03/20 -Continue supportive care -Discussed r/b/a of antiviral medications.  Patient wishes to start. -Given strict precautions - Plan: molnupiravir EUA 200 mg CAPS  Latent autoimmune diabetes in adult -Patient encouraged to check FSBS frequently  Follow-up with PCP as needed   I discussed the assessment and treatment plan with the patient. The patient was provided an opportunity to ask questions and all were answered. The patient agreed with the plan and demonstrated an understanding of the instructions.   The patient was advised to call back or seek an in-person evaluation if the symptoms worsen or if the condition fails to improve as anticipated.   Deeann Saint, MD

## 2020-09-29 ENCOUNTER — Encounter: Payer: Self-pay | Admitting: Family Medicine

## 2020-09-29 NOTE — Telephone Encounter (Signed)
#  1, he needs o/v. #2 have him call his insurer and see which long acting insulin they cover.

## 2020-10-02 ENCOUNTER — Other Ambulatory Visit: Payer: Self-pay | Admitting: Family Medicine

## 2020-10-31 ENCOUNTER — Other Ambulatory Visit: Payer: Self-pay | Admitting: Family Medicine

## 2020-11-03 ENCOUNTER — Encounter: Payer: Self-pay | Admitting: Family Medicine

## 2020-11-03 ENCOUNTER — Ambulatory Visit: Payer: BC Managed Care – PPO | Admitting: Family Medicine

## 2020-11-03 ENCOUNTER — Other Ambulatory Visit: Payer: Self-pay

## 2020-11-03 VITALS — BP 143/87 | HR 79 | Temp 97.9°F | Resp 16 | Ht 64.0 in | Wt 157.2 lb

## 2020-11-03 DIAGNOSIS — F3342 Major depressive disorder, recurrent, in full remission: Secondary | ICD-10-CM

## 2020-11-03 DIAGNOSIS — F411 Generalized anxiety disorder: Secondary | ICD-10-CM | POA: Diagnosis not present

## 2020-11-03 DIAGNOSIS — E7800 Pure hypercholesterolemia, unspecified: Secondary | ICD-10-CM

## 2020-11-03 DIAGNOSIS — E139 Other specified diabetes mellitus without complications: Secondary | ICD-10-CM | POA: Diagnosis not present

## 2020-11-03 DIAGNOSIS — Z23 Encounter for immunization: Secondary | ICD-10-CM

## 2020-11-03 DIAGNOSIS — I10 Essential (primary) hypertension: Secondary | ICD-10-CM | POA: Diagnosis not present

## 2020-11-03 MED ORDER — TAMSULOSIN HCL 0.4 MG PO CAPS: 0.4000 mg | ORAL_CAPSULE | Freq: Every day | ORAL | 3 refills | Status: DC

## 2020-11-03 MED ORDER — CITALOPRAM HYDROBROMIDE 40 MG PO TABS: 40.0000 mg | ORAL_TABLET | Freq: Every day | ORAL | 3 refills | Status: DC

## 2020-11-03 MED ORDER — LANTUS SOLOSTAR 100 UNIT/ML ~~LOC~~ SOPN: PEN_INJECTOR | SUBCUTANEOUS | 11 refills | Status: DC

## 2020-11-03 MED ORDER — NOVOLOG FLEXPEN 100 UNIT/ML ~~LOC~~ SOPN: PEN_INJECTOR | SUBCUTANEOUS | 11 refills | Status: DC

## 2020-11-03 MED ORDER — SILDENAFIL CITRATE 100 MG PO TABS: 100.0000 mg | ORAL_TABLET | Freq: Every day | ORAL | 11 refills | Status: DC | PRN

## 2020-11-03 MED ORDER — PANTOPRAZOLE SODIUM 40 MG PO TBEC: 40.0000 mg | DELAYED_RELEASE_TABLET | Freq: Every day | ORAL | 3 refills | Status: DC

## 2020-11-03 MED ORDER — AMLODIPINE BESYLATE 5 MG PO TABS: 5.0000 mg | ORAL_TABLET | Freq: Every day | ORAL | 1 refills | Status: DC

## 2020-11-03 MED ORDER — SURE COMFORT PEN NEEDLES 32G X 4 MM MISC: 10 refills | Status: DC

## 2020-11-03 MED ORDER — AMITRIPTYLINE HCL 25 MG PO TABS: ORAL_TABLET | ORAL | 5 refills | Status: DC

## 2020-11-03 MED ORDER — LOSARTAN POTASSIUM 100 MG PO TABS: 100.0000 mg | ORAL_TABLET | Freq: Every day | ORAL | 3 refills | Status: DC

## 2020-11-03 NOTE — Progress Notes (Signed)
OFFICE VISIT  11/07/2020  CC:  Chief Complaint  Patient presents with   Follow-up    RCI, 3 mo.    HPI:    Patient is a 56 y.o. Caucasian male who presents for 3 mo f/u DM, HLD, GAD with hx of recurrent MDD. A/P as of last visit: "1) LADA managed as type I.  Hx of very mild microalbuminuria. Doing well except last couple weeks diet not as good and glucoses up into 150s avg. Bmet, a1c, and microalb/cr today. Cont 16 U lantus qd and 6 U novolog qAC.   2) HTN: stable on losartan 100 qd. Lytes/cr today.   3) Hypothyroidism: TSH monitoring today.   4) HLD: doing well on atorvastatin 20mg  qd (ran out 1 mo ago). LDL 38 three mo ago-->plan rpt lipids and hepatic panel 3 mo.   5) GAD, hx of recurrent MDD. GAD not ideally controlled and we'll get him back on wellbutrin xl 150 qd. Continue citalopram 40 qd.   6) Bilat hands osteoarthritis: good response to meloxicam 15mg  qd.   7) Preventative health care: Prevnar 20 today. He's still considering shingrix"  INTERIM HX: Labs all stable last visit except Hba1c up to 7.4%. My instructions at that time: "Hba1c up to 7.4%-->we were expecting this b/c diet not as good as usual lately. If he thinks he won't be able to improve diet any in the next few months, then increase lantus to 17U daily and novolog to 7 U with each meal".  About 2.5 mo ago his insurer denied his lantus so we had to switch to toujeo--same dosing. He is frustrated b/c since switching to toujeo his glucoses have consistently been higher. No change in diet or activity level.    No home bp monitoring.  Anxiety/mood: says he is feeling well from this standpoint on wellbutrin xl 150mg  qd and citalopram 40 qd.  ROS as above, plus--> no fevers, no CP, no SOB, no wheezing, no cough, no dizziness, no HAs, no rashes, no melena/hematochezia.  No polyuria or polydipsia.  No myalgias or arthralgias.  No focal weakness, paresthesias, or tremors.  No acute vision or hearing  abnormalities.  No dysuria or unusual/new urinary urgency or frequency.  No recent changes in lower legs. No n/v/d or abd pain.  No palpitations.    Past Medical History:  Diagnosis Date   Back pain    s/p back surgery   Chronic hyponatremia 08/2012 onset   Onset was right after starting amitriptyline for his neuropathic leg pain--has been stable.  Likely ADH-like effect on kidney from amitriptyline.   COVID-19 virus infection 09/03/2020   History of low back pain 06/24/2011   Hyperlipidemia    Hypothyroidism    LADA (latent autoimmune diabetes in adults), managed as type 1 ([facility]) Dx'd 2014   Dr. 09/05/2020.   Migraines    Neuropathic pain of right lower extremity    PLANTAR WART, RIGHT 10/09/2009   Snoring    Sleep study neg of OSA 04/2018   Tobacco dependence    quit 2012; restarted not long after    Past Surgical History:  Procedure Laterality Date   BACK SURGERY  2009   lower lumbar fusion- rubber disc in back   CARDIOVASCULAR STRESS TEST  03/21/14   No ischemia/low risk study; however, LV function showed EF 49% and global LV hypokinesis--echo recommended and it showed normal LV fxn.   NASAL SINUS SURGERY  2007   TRANSTHORACIC ECHOCARDIOGRAM  03/24/14   Normal LV fxn,  EF 50-55%, no wall motion abnormalities: (grade 1 diast dysfxn)    Outpatient Medications Prior to Visit  Medication Sig Dispense Refill   atorvastatin (LIPITOR) 20 MG tablet Take 1 tablet (20 mg total) by mouth daily. 90 tablet 3   buPROPion (WELLBUTRIN XL) 150 MG 24 hr tablet Take 1 tablet (150 mg total) by mouth daily. 90 tablet 3   Continuous Blood Gluc Sensor (FREESTYLE LIBRE SENSOR SYSTEM) MISC USE 1 EACH TO APPLY  EVERY 14 DAYS. 2 each 99   fluticasone (FLONASE) 50 MCG/ACT nasal spray Place 2 sprays into both nostrils daily. 16 g 3   levothyroxine (SYNTHROID) 137 MCG tablet TAKE ONE TABLET BY MOUTH EVERY DAY BEFORE BREAKFAST 30 tablet 2   meloxicam (MOBIC) 15 MG tablet TAKE ONE TABLET BY MOUTH EVERY DAY 30  tablet 0   amitriptyline (ELAVIL) 25 MG tablet TAKE ONE TABLET BY MOUTH EVERY MORNING AND TAKE THREE TABLETS at bedtime FOR right IN LEG PAIN 120 tablet 5   citalopram (CELEXA) 40 MG tablet Take 1 tablet (40 mg total) by mouth daily. 90 tablet 3   insulin aspart (NOVOLOG FLEXPEN) 100 UNIT/ML FlexPen 6 units qAC 15 mL 1   insulin glargine, 1 Unit Dial, (TOUJEO SOLOSTAR) 300 UNIT/ML Solostar Pen 16 U SQ qd 3 mL 4   losartan (COZAAR) 100 MG tablet Take 1 tablet (100 mg total) by mouth daily. 90 tablet 3   pantoprazole (PROTONIX) 40 MG tablet TAKE ONE TABLET BY MOUTH EVERY DAY 30 tablet 5   sildenafil (VIAGRA) 50 MG tablet 1-2 tabs po qd prn.  Take 30-60 min prior to intercourse. 10 tablet 6   SURE COMFORT PEN NEEDLES 32G X 4 MM MISC USE FOUR TIMES DAILY AS NEEDED 100 each 10   tamsulosin (FLOMAX) 0.4 MG CAPS capsule Take 1 capsule (0.4 mg total) by mouth daily after supper. 90 capsule 3   No facility-administered medications prior to visit.    Allergies  Allergen Reactions   Augmentin [Amoxicillin-Pot Clavulanate] Nausea And Vomiting   Oxycodone-Acetaminophen     REACTION: itching   Levothyroxine Rash    Had rash associated with generic levothyroxine, but not brand-name Synthroid.  Likely allergic to a dye or filler in the generic preparation.   Lisinopril Cough    ROS As per HPI  PE: Vitals with BMI 11/03/2020 08/03/2020 05/03/2020  Height 5\' 4"  5\' 4"  5\' 4"   Weight 157 lbs 3 oz 148 lbs 13 oz 148 lbs  BMI 26.97 25.53 25.39  Systolic 143 132  Diastolic 87 75 69  Pulse 79 87 86   Gen: Alert, well appearing.  Patient is oriented to person, place, time, and situation. AFFECT: pleasant, lucid thought and speech. CV: RRR, no m/r/g.   LUNGS: CTA bilat, nonlabored resps, good aeration in all lung fields. EXT: no clubbing or cyanosis.  no edema.    LABS:  Lab Results  Component Value Date   TSH 2.95 08/03/2020   Lab Results  Component Value Date   WBC 9.8 05/03/2020   HGB 14.5  05/03/2020   HCT 41.6 05/03/2020   MCV 91.6 05/03/2020   PLT 269.0 05/03/2020   Lab Results  Component Value Date   CREATININE 1.09 08/03/2020   BUN 22 08/03/2020   NA 128 (L) 08/03/2020   K 4.1 08/03/2020   CL 92 (L) 08/03/2020   CO2 27 08/03/2020   Lab Results  Component Value Date   ALT 17 05/03/2020   AST 15 05/03/2020  ALKPHOS 129 (H) 05/03/2020   BILITOT 0.8 05/03/2020   Lab Results  Component Value Date   CHOL 107 05/03/2020   Lab Results  Component Value Date   HDL 61.40 05/03/2020   Lab Results  Component Value Date   LDLCALC 38 05/03/2020   Lab Results  Component Value Date   TRIG 36.0 05/03/2020   Lab Results  Component Value Date   CHOLHDL 2 05/03/2020   Lab Results  Component Value Date   PSA 0.29 05/03/2020   PSA 0.23 12/24/2018   PSA 0.23 11/18/2017   Lab Results  Component Value Date   HGBA1C 7.4 (H) 08/03/2020   POC Hba1c today -->7.1%  IMPRESSION AND PLAN:  1) LADA, managed as type I. Glucose elevations since insurance made him change from lantus to toujeo. He wants to switch back to lantus so I ordered this today and we'll see if ins coverage/cost still an issue. POC Hba1c today is 7.1%.  Suspect venous/serum Hba1c about 0.5% higher.  2) HTN: not ideal control. Add amlodipine 5mg  qd and continue losartan 100 qd.  3) HLD: doing well on atorvastatin 20mg  qd. Pt not fasting today. LDL 38 six mo ago-->plan rpt lipids and hepatic panel 6 mo.  4) GAD, hx of recurrent MDD. Doing well on wellbutrin xl 150 qd and citalopram 40 qd.  Na, K, Cr, and AST/ALT have shown longterm stability on regular checks, most recent 3 mo ago.  Deferred these labs today and will rpt at next "routine" f/u in 40mo.  An After Visit Summary was printed and given to the patient.  FOLLOW UP: Return in about 4 weeks (around 12/01/2020) for f/u HTN and DM. Cpe 04/2021  Signed:  12/03/2020, MD           11/07/2020

## 2020-11-06 ENCOUNTER — Other Ambulatory Visit: Payer: Self-pay

## 2020-11-07 LAB — POCT GLYCOSYLATED HEMOGLOBIN (HGB A1C)
HbA1c POC (<> result, manual entry): 7.1 % (ref 4.0–5.6)
HbA1c, POC (controlled diabetic range): 7.1 % — AB (ref 0.0–7.0)
HbA1c, POC (prediabetic range): 7.1 % — AB (ref 5.7–6.4)
Hemoglobin A1C: 7.1 % — AB (ref 4.0–5.6)

## 2020-11-09 ENCOUNTER — Telehealth: Payer: Self-pay

## 2020-11-09 MED ORDER — INSULIN GLARGINE-YFGN 100 UNIT/ML ~~LOC~~ SOLN: SUBCUTANEOUS | 11 refills | Status: DC

## 2020-11-09 NOTE — Telephone Encounter (Signed)
Spoke with patient regarding recommendations,voiced understanding.  

## 2020-11-09 NOTE — Telephone Encounter (Addendum)
Spoke with patient regarding recommendations,voiced understanding.  

## 2020-11-09 NOTE — Telephone Encounter (Signed)
Stop amlodipine and see if HA resolves soon after stopping.  Call and let us know.

## 2020-11-09 NOTE — Telephone Encounter (Signed)
PA completed thru covermymeds on 11/06/20, denial letter sent and given to provider for review.  Please review and advise

## 2020-11-09 NOTE — Telephone Encounter (Signed)
Pt was started on amlodipine 5mg  qd 11/03/20.  Please review and advise

## 2020-11-09 NOTE — Telephone Encounter (Signed)
  Encourage patient to contact the pharmacy for refills or they can request refills through [facility]  LAST APPOINTMENT DATE: 11/03/20  NEXT APPOINTMENT DATE: 12/01/20  MEDICATION: Long lasting Inuslin   Is the patient out of medication? yes  PHARMACY: [facility] - [place], [place] - [address]  COMMENT: Patient states that insurance is requiring that Dr.McGowen do a PA. Form is upfront in folder.  Wanting to know if this can be filled out so he is able to get his insulin.  Let patient know to contact pharmacy at the end of the day to make sure medication is ready.  Please notify patient to allow 48-72 hours to process

## 2020-11-09 NOTE — Telephone Encounter (Signed)
Patient state he was recently put on BP medication and has noticed the last two days that he has had a headache. Wanting to know if she should stop taking it or what he should do.

## 2020-11-09 NOTE — Telephone Encounter (Signed)
According to this denial letter, it looks like they cover semglee pen so I'll send in rx for this-- we have no other choice unless he wants to pay out-of-pocket for brand name lantus. The rx will say 20 U once a day but have him start at 15 U and work up gradually if needed.

## 2020-11-13 ENCOUNTER — Other Ambulatory Visit: Payer: Self-pay

## 2020-11-13 ENCOUNTER — Encounter: Payer: Self-pay | Admitting: Family Medicine

## 2020-11-13 ENCOUNTER — Ambulatory Visit: Payer: BC Managed Care – PPO | Admitting: Family Medicine

## 2020-11-13 VITALS — BP 150/93 | HR 84 | Temp 98.3°F | Resp 16 | Ht 64.0 in | Wt 154.8 lb

## 2020-11-13 DIAGNOSIS — E139 Other specified diabetes mellitus without complications: Secondary | ICD-10-CM

## 2020-11-13 DIAGNOSIS — T50905A Adverse effect of unspecified drugs, medicaments and biological substances, initial encounter: Secondary | ICD-10-CM

## 2020-11-13 DIAGNOSIS — I10 Essential (primary) hypertension: Secondary | ICD-10-CM | POA: Diagnosis not present

## 2020-11-13 DIAGNOSIS — G43009 Migraine without aura, not intractable, without status migrainosus: Secondary | ICD-10-CM

## 2020-11-13 MED ORDER — METOPROLOL TARTRATE 25 MG PO TABS: 25.0000 mg | ORAL_TABLET | Freq: Two times a day (BID) | ORAL | 1 refills | Status: DC

## 2020-11-13 NOTE — Telephone Encounter (Signed)
Pt called in stating he stopped amlodipine and no more headaches. BP today is 171/117 with HR 108. Transferred pt to Nurse Triage.

## 2020-11-13 NOTE — Patient Instructions (Signed)
If blood pressure is not improved to 140/90 or better in 2 days then increase metoprolol to TWO tabs twice daily.  Continue your losartan 100mg  daily.

## 2020-11-13 NOTE — Telephone Encounter (Signed)
Pls see if he can come in at 4 or 4:15 today for f/u of his bp issue.

## 2020-11-13 NOTE — Progress Notes (Signed)
OFFICE VISIT  11/13/2020  CC:  Chief Complaint  Patient presents with   Elevated blood pressure    Stopped taking amlodipine due to HA, currently experiencing. Last HA was Friday   HPI:    Patient is a 55 y.o. Caucasian male who presents for elevated blood pressure. I last saw him 10d ago. A/P as of that visit: "1) LADA, managed as type I. Glucose elevations since insurance made him change from lantus to toujeo. He wants to switch back to lantus so I ordered this today and we'll see if ins coverage/cost still an issue. POC Hba1c today is 7.1%.  Suspect venous/serum Hba1c about 0.5% higher.   2) HTN: not ideal control. Add amlodipine 5mg  qd and continue losartan 100 qd.   3) HLD: doing well on atorvastatin 20mg  qd. Pt not fasting today. LDL 38 six mo ago-->plan rpt lipids and hepatic panel 6 mo.   4) GAD, hx of recurrent MDD. Doing well on wellbutrin xl 150 qd and citalopram 40 qd.   Na, K, Cr, and AST/ALT have shown longterm stability on regular checks, most recent 3 mo ago.  Deferred these labs today and will rpt at next "routine" f/u in 74mo."  INTERIM HX: Says amlodipine caused HA so stopped it, and day after he stopped it the HA completely resolved.  BPs the last 6d (3d while still on amlod, then 3d off) reviewed today->130s-170s syst, 80s-117 diast. HR 90s-low 100s. No diff between on/off med.  Today has a generalized HA.  He describes long hx of migraine HAs (generalized moderate intensity throbbing, +photo and phonophobia.  No nausea or aura).  Ice pack helps more than taking ibup or tylenol. No blurry vision, double vision, dizziness, or focal neurologic complaint. No CP, SOB, or DOE.  ROS as above, plus--> no fevers, no wheezing, no cough,  no rashes, no melena/hematochezia.  No polyuria or polydipsia.  No myalgias or arthralgias.   No dysuria or unusual/new urinary urgency or frequency.  No recent changes in lower legs. No n/v/d or abd pain.  No palpitations.      Past Medical History:  Diagnosis Date   Back pain    s/p back surgery   Chronic hyponatremia 08/2012 onset   Onset was right after starting amitriptyline for his neuropathic leg pain--has been stable.  Likely ADH-like effect on kidney from amitriptyline.   COVID-19 virus infection 09/03/2020   History of low back pain 06/24/2011   Hyperlipidemia    Hypothyroidism    LADA (latent autoimmune diabetes in adults), managed as type 1 (HCC) Dx'd 2014   Dr. 06/26/2011.   Migraines    Neuropathic pain of right lower extremity    PLANTAR WART, RIGHT 10/09/2009   Snoring    Sleep study neg of OSA 04/2018   Tobacco dependence    quit 2012; restarted not long after    Past Surgical History:  Procedure Laterality Date   BACK SURGERY  2009   lower lumbar fusion- rubber disc in back   CARDIOVASCULAR STRESS TEST  03/21/14   No ischemia/low risk study; however, LV function showed EF 49% and global LV hypokinesis--echo recommended and it showed normal LV fxn.   NASAL SINUS SURGERY  2007   TRANSTHORACIC ECHOCARDIOGRAM  03/24/14   Normal LV fxn, EF 50-55%, no wall motion abnormalities: (grade 1 diast dysfxn)    Outpatient Medications Prior to Visit  Medication Sig Dispense Refill   amitriptyline (ELAVIL) 25 MG tablet TAKE ONE TABLET BY MOUTH EVERY MORNING  AND TAKE THREE TABLETS at bedtime FOR right IN LEG PAIN 120 tablet 5   atorvastatin (LIPITOR) 20 MG tablet Take 1 tablet (20 mg total) by mouth daily. 90 tablet 3   buPROPion (WELLBUTRIN XL) 150 MG 24 hr tablet Take 1 tablet (150 mg total) by mouth daily. 90 tablet 3   citalopram (CELEXA) 40 MG tablet Take 1 tablet (40 mg total) by mouth daily. 90 tablet 3   Continuous Blood Gluc Sensor (FREESTYLE LIBRE SENSOR SYSTEM) MISC USE 1 EACH TO APPLY  EVERY 14 DAYS. 2 each 99   fluticasone (FLONASE) 50 MCG/ACT nasal spray Place 2 sprays into both nostrils daily. 16 g 3   insulin aspart (NOVOLOG FLEXPEN) 100 UNIT/ML FlexPen 15 units qAC 15 mL 11   insulin  glargine-yfgn (SEMGLEE, YFGN,) 100 UNIT/ML injection 20 U SQ qd 10 mL 11   Insulin Pen Needle (SURE COMFORT PEN NEEDLES) 32G X 4 MM MISC USE FOUR TIMES DAILY AS NEEDED 100 each 10   levothyroxine (SYNTHROID) 137 MCG tablet TAKE ONE TABLET BY MOUTH EVERY DAY BEFORE BREAKFAST 30 tablet 2   losartan (COZAAR) 100 MG tablet Take 1 tablet (100 mg total) by mouth daily. 90 tablet 3   meloxicam (MOBIC) 15 MG tablet TAKE ONE TABLET BY MOUTH EVERY DAY 30 tablet 0   pantoprazole (PROTONIX) 40 MG tablet Take 1 tablet (40 mg total) by mouth daily. 90 tablet 3   sildenafil (VIAGRA) 100 MG tablet Take 1 tablet (100 mg total) by mouth daily as needed for erectile dysfunction. 10 tablet 11   tamsulosin (FLOMAX) 0.4 MG CAPS capsule Take 1 capsule (0.4 mg total) by mouth daily after supper. 90 capsule 3   amLODipine (NORVASC) 5 MG tablet Take 1 tablet (5 mg total) by mouth daily. (Patient not taking: Reported on 11/13/2020) 30 tablet 1   No facility-administered medications prior to visit.    Allergies  Allergen Reactions   Augmentin [Amoxicillin-Pot Clavulanate] Nausea And Vomiting   Oxycodone-Acetaminophen     REACTION: itching   Levothyroxine Rash    Had rash associated with generic levothyroxine, but not brand-name Synthroid.  Likely allergic to a dye or filler in the generic preparation.   Lisinopril Cough    ROS As per HPI  PE: Vitals with BMI 11/13/2020 11/03/2020 08/03/2020  Height 5\' 4"  5\' 4"  5\' 4"   Weight 154 lbs 13 oz 157 lbs 3 oz 148 lbs 13 oz  BMI 26.56 26.97 25.53  Systolic 150 143  Diastolic 93 87 75  Pulse 84 79 87   Gen: Alert, well appearing.  Patient is oriented to person, place, time, and situation. AFFECT: pleasant, lucid thought and speech. CV: RRR (rate about 95), no m/r/g.   LUNGS: CTA bilat, nonlabored resps, good aeration in all lung fields. EXT: no clubbing or cyanosis.  no edema.    LABS:    Chemistry      Component Value Date/Time   NA 128 (L) 08/03/2020 1620    NA 123 (A) 09/15/2018 0000   K 4.1 08/03/2020 1620   CL 92 (L) 08/03/2020 1620   CO2 27 08/03/2020 1620   BUN 22 08/03/2020 1620   CREATININE 1.09 08/03/2020 1620   CREATININE 0.93 08/26/2019 1542   GLU 231 09/15/2018 0000      Component Value Date/Time   CALCIUM 9.3 08/03/2020 1620   ALKPHOS 129 (H) 05/03/2020 0955   AST 15 05/03/2020 0955   ALT 17 05/03/2020 0955   BILITOT 0.8 05/03/2020 0955  Lab Results  Component Value Date   HGBA1C 7.1 (A) 11/03/2020   HGBA1C 7.1 11/03/2020   HGBA1C 7.1 (A) 11/03/2020   HGBA1C 7.1 (A) 11/03/2020   IMPRESSION AND PLAN:  1) HTN, uncontrolled Add BB--lopressor 25 bid, inc to 50 bid if not signif imp in 2d. Cont losartan 100 qd. Hard to tell if today's HA is bp related but it sounds like bp has been elevated/uncontrolled for quite a while---we just didn't know it until he started checking bp more lately.  2) HA: side effect from amlodipine. D/c'd this med and HA resolved. Suspect today's HA is one of his usual migraines.  No meds for this at this time.  3) LADA: insurance won't cover lantus but they cover semglee and we switched him over to this and glucoses seem much better controlled on 20 U qd of this + 10 U novolog qAC.  An After Visit Summary was printed and given to the patient.  FOLLOW UP: Return for keep f/u already scheduled for 12/01/20. Cpe 04/2021  Signed:  Santiago Bumpers, MD           11/13/2020

## 2020-11-13 NOTE — Telephone Encounter (Signed)
FYI. Please see below.  [facility] Day - Client TELEPHONE ADVICE RECORD [facility] Patient Name: Daniel Stevenson Gender: Male DOB: [birth year] Age: 55 Y 10 M 3 D Return Phone Number: [phone] (Primary) Address: City/ State/ Zip: [place]  [address] Client [facility] Day - Client Client Site  [facility] [place] - Day Physician Santiago Bumpers - MD Contact Type Call Who Is Calling Patient / Member / Family / Caregiver Call Type Triage / Clinical Relationship To Patient Self Return Phone Number [phone] (Primary) Chief Complaint Blood Pressure High Reason for Call Symptomatic / Request for Health Information Initial Comment Caller states, pt on bp rx and has headaches. Pt says he feels fine but 171/117 bp hr 108. Translation No Nurse Assessment Nurse: Thana Farr, RN, Ladona Ridgel Date/Time [name] Time): 11/13/2020 9:41:15 AM Confirm and document reason for call. If symptomatic, describe symptoms. ---Was put on BP medication but caused headache so doctor recommended to stop the medication. Current BP 171/117 Denies headache at this time. Denies chest pain, SOB, or any other symptoms at this time. Does the patient have any new or worsening symptoms? ---Yes Will a triage be completed? ---Yes Related visit to physician within the last 2 weeks? ---Yes Does the PT have any chronic conditions? (i.e. diabetes, asthma, this includes High risk factors for pregnancy, etc.) ---Yes List chronic conditions. ---HTN, diabetes Is this a behavioral health or substance abuse call? ---No Guidelines Guideline Title Affirmed Question Affirmed Notes Nurse Date/Time (Eastern Time) Blood Pressure - High Systolic BP >= 180 OR Diastolic >= 110 [place], RN, Ladona Ridgel 11/13/2020 9:43:33 AM Disp. Time [name] Time) Disposition Final User 11/13/2020 9:45:02 AM See PCP within 24 Hours Yes Thana Farr, RN, Ladona Ridgel PLEASE NOTE: All timestamps  contained within this report are represented as [place] Standard Time. CONFIDENTIALTY NOTICE: This fax transmission is intended only for the addressee. It contains information that is legally privileged, confidential or otherwise protected from use or disclosure. If you are not the intended recipient, you are strictly prohibited from reviewing, disclosing, copying using or disseminating any of this information or taking any action in reliance on or regarding this information. If you have received this fax in error, please notify us immediately by telephone so that we can arrange for its return to [place]. Phone: [phone], Toll-Free: [phone], Fax: [phone] Page: 2 of 2 Call Id: [id number] Caller Disagree/Comply Comply Caller Understands Yes PreDisposition Call Doctor Care Advice Given Per Guideline SEE PCP WITHIN 24 HOURS: * You become worse * Chest pain or difficulty breathing occurs * Difficulty walking, difficulty talking, or severe headache occurs * Weakness or numbness of the face, arm or leg on one side of the body occurs CALL BACK IF: Referrals REFERRED TO PCP OFFICE

## 2020-11-13 NOTE — Telephone Encounter (Signed)
FYI. Please see below.  Pt scheduled for 4pm f/u appt

## 2020-11-16 ENCOUNTER — Encounter: Payer: Self-pay | Admitting: Family Medicine

## 2020-12-01 ENCOUNTER — Other Ambulatory Visit: Payer: Self-pay | Admitting: Family Medicine

## 2020-12-01 ENCOUNTER — Ambulatory Visit: Payer: BC Managed Care – PPO | Admitting: Family Medicine

## 2020-12-01 ENCOUNTER — Encounter: Payer: Self-pay | Admitting: Family Medicine

## 2020-12-01 ENCOUNTER — Other Ambulatory Visit: Payer: Self-pay

## 2020-12-01 VITALS — BP 137/80 | HR 74 | Temp 97.6°F | Resp 16 | Ht 64.0 in | Wt 155.2 lb

## 2020-12-01 DIAGNOSIS — I10 Essential (primary) hypertension: Secondary | ICD-10-CM | POA: Diagnosis not present

## 2020-12-01 DIAGNOSIS — R5383 Other fatigue: Secondary | ICD-10-CM | POA: Diagnosis not present

## 2020-12-01 DIAGNOSIS — E039 Hypothyroidism, unspecified: Secondary | ICD-10-CM

## 2020-12-01 MED ORDER — METOPROLOL TARTRATE 50 MG PO TABS: 50.0000 mg | ORAL_TABLET | Freq: Two times a day (BID) | ORAL | 1 refills | Status: DC

## 2020-12-01 MED ORDER — SPIRONOLACTONE 50 MG PO TABS: 50.0000 mg | ORAL_TABLET | Freq: Every day | ORAL | 1 refills | Status: DC

## 2020-12-01 NOTE — Progress Notes (Signed)
OFFICE VISIT  12/01/2020  CC:  Chief Complaint  Patient presents with   Follow-up    Hypertension and DM   HPI:    Patient is a 55 y.o. male who presents for f/u uncontrolled HTN. A/P as of last visit 18 days ago. "1) HTN, uncontrolled Add BB--lopressor 25 bid, inc to 50 bid if not signif imp in 2d. Cont losartan 100 qd. Hard to tell if today's HA is bp related but it sounds like bp has been elevated/uncontrolled for quite a while---we just didn't know it until he started checking bp more lately.   2) HA: side effect from amlodipine. D/c'd this med and HA resolved. Suspect today's HA is one of his usual migraines.  No meds for this at this time.   3) LADA: insurance won't cover lantus but they cover semglee and we switched him over to this and glucoses seem much better controlled on 20 U qd of this + 10 U novolog qAC."  INTERIM HX: Says he is doing okay but more fatigued lately.  Was going on before he got on metoprolol last visit but does seem worse since starting it. Taking 2 of the 25mg  tabs bid and bp better but still 150 avg syst, 90s diast.  He still has some intermittent headaches but does not clearly tie these to times when his blood pressure is significantly elevated. HR now down into 70s more consistently.  Past Medical History:  Diagnosis Date   Back pain    s/p back surgery   Chronic hyponatremia 08/2012 onset   Onset was right after starting amitriptyline for his neuropathic leg pain--has been stable.  Likely ADH-like effect on kidney from amitriptyline.   COVID-19 virus infection 09/03/2020   History of low back pain 06/24/2011   Hyperlipidemia    Hypothyroidism    LADA (latent autoimmune diabetes in adults), managed as type 1 (HCC) Dx'd 2014   Dr. 2015.   Migraines    Neuropathic pain of right lower extremity    PLANTAR WART, RIGHT 10/09/2009   Snoring    Sleep study neg of OSA 04/2018   Tobacco dependence    quit 2012; restarted not long after     Past Surgical History:  Procedure Laterality Date   BACK SURGERY  2009   lower lumbar fusion- rubber disc in back   CARDIOVASCULAR STRESS TEST  03/21/14   No ischemia/low risk study; however, LV function showed EF 49% and global LV hypokinesis--echo recommended and it showed normal LV fxn.   NASAL SINUS SURGERY  2007   TRANSTHORACIC ECHOCARDIOGRAM  03/24/14   Normal LV fxn, EF 50-55%, no wall motion abnormalities: (grade 1 diast dysfxn)    Outpatient Medications Prior to Visit  Medication Sig Dispense Refill   amitriptyline (ELAVIL) 25 MG tablet TAKE ONE TABLET BY MOUTH EVERY MORNING AND TAKE THREE TABLETS at bedtime FOR right IN LEG PAIN 120 tablet 5   atorvastatin (LIPITOR) 20 MG tablet Take 1 tablet (20 mg total) by mouth daily. 90 tablet 3   buPROPion (WELLBUTRIN XL) 150 MG 24 hr tablet Take 1 tablet (150 mg total) by mouth daily. 90 tablet 3   citalopram (CELEXA) 40 MG tablet Take 1 tablet (40 mg total) by mouth daily. 90 tablet 3   Continuous Blood Gluc Sensor (FREESTYLE LIBRE SENSOR SYSTEM) MISC USE 1 EACH TO APPLY  EVERY 14 DAYS. 2 each 99   fluticasone (FLONASE) 50 MCG/ACT nasal spray Place 2 sprays into both nostrils daily. 16 g  3   insulin aspart (NOVOLOG FLEXPEN) 100 UNIT/ML FlexPen 15 units qAC 15 mL 11   insulin glargine-yfgn (SEMGLEE, YFGN,) 100 UNIT/ML injection 20 U SQ qd 10 mL 11   Insulin Pen Needle (SURE COMFORT PEN NEEDLES) 32G X 4 MM MISC USE FOUR TIMES DAILY AS NEEDED 100 each 10   levothyroxine (SYNTHROID) 137 MCG tablet TAKE ONE TABLET BY MOUTH EVERY DAY BEFORE BREAKFAST 30 tablet 2   losartan (COZAAR) 100 MG tablet Take 1 tablet (100 mg total) by mouth daily. 90 tablet 3   meloxicam (MOBIC) 15 MG tablet TAKE ONE TABLET BY MOUTH EVERY DAY 30 tablet 0   pantoprazole (PROTONIX) 40 MG tablet Take 1 tablet (40 mg total) by mouth daily. 90 tablet 3   sildenafil (VIAGRA) 100 MG tablet Take 1 tablet (100 mg total) by mouth daily as needed for erectile dysfunction. 10  tablet 11   tamsulosin (FLOMAX) 0.4 MG CAPS capsule Take 1 capsule (0.4 mg total) by mouth daily after supper. 90 capsule 3   metoprolol tartrate (LOPRESSOR) 25 MG tablet Take 1 tablet (25 mg total) by mouth 2 (two) times daily. 60 tablet 1   amLODipine (NORVASC) 5 MG tablet Take 1 tablet (5 mg total) by mouth daily. (Patient not taking: No sig reported) 30 tablet 1   No facility-administered medications prior to visit.    Allergies  Allergen Reactions   Amlodipine Other (See Comments)    Headache   Augmentin [Amoxicillin-Pot Clavulanate] Nausea And Vomiting   Oxycodone-Acetaminophen     REACTION: itching   Levothyroxine Rash    Had rash associated with generic levothyroxine, but not brand-name Synthroid.  Likely allergic to a dye or filler in the generic preparation.   Lisinopril Cough    ROS As per HPI  PE: Vitals with BMI 12/01/2020 11/13/2020 11/03/2020  Height 5\' 4"  5\' 4"  5\' 4"   Weight 155 lbs 3 oz 154 lbs 13 oz 157 lbs 3 oz  BMI 26.63 26.56 26.97  Systolic 137 150  Diastolic 80 93 87  Pulse 74 84 79     General: Alert and well-appearing, pleasant. Cardiovascular: Regular rhythm and rate, no murmur. Extremities showed no edema.  LABS:  Lab Results  Component Value Date   TSH 2.95 08/03/2020   Lab Results  Component Value Date   WBC 9.8 05/03/2020   HGB 14.5 05/03/2020   HCT 41.6 05/03/2020   MCV 91.6 05/03/2020   PLT 269.0 05/03/2020   Lab Results  Component Value Date   CREATININE 1.09 08/03/2020   BUN 22 08/03/2020   NA 128 (L) 08/03/2020   K 4.1 08/03/2020   CL 92 (L) 08/03/2020   CO2 27 08/03/2020   Lab Results  Component Value Date   ALT 17 05/03/2020   AST 15 05/03/2020   ALKPHOS 129 (H) 05/03/2020   BILITOT 0.8 05/03/2020   Lab Results  Component Value Date   CHOL 107 05/03/2020   Lab Results  Component Value Date   HDL 61.40 05/03/2020   Lab Results  Component Value Date   LDLCALC 38 05/03/2020   Lab Results  Component  Value Date   TRIG 36.0 05/03/2020   Lab Results  Component Value Date   CHOLHDL 2 05/03/2020   Lab Results  Component Value Date   PSA 0.29 05/03/2020   PSA 0.23 12/24/2018   PSA 0.23 11/18/2017   Lab Results  Component Value Date   HGBA1C 7.1 (A) 11/03/2020   HGBA1C 7.1 11/03/2020  HGBA1C 7.1 (A) 11/03/2020   HGBA1C 7.1 (A) 11/03/2020   IMPRESSION AND PLAN:  #1 uncontrolled hypertension: Blood pressure is better on 50 mg twice a day Lopressor in addition to his losartan 100 mg a day.  However I am worried that it is making him fatigued.  We will continue with current dosing of both these medicines for now and will add spironolactone 50 mg a day.  We will check complete metabolic panel, CBC, and TSH today.  #2 diabetes, pretty well controlled.  Glucoses significantly improved and near normal--he will continue 20 units of Semglee once a day.  Also continue NovoLog 8 to 10 units before each meal. Next a1c after 02/02/21.  #3 fatigue: Possible side effect of beta-blocker.  As per #1 above, continue this med for now. Check cbc, cmet, and tsh as well.  An After Visit Summary was printed and given to the patient.  FOLLOW UP: Return for 7-10 d f/u HTN/bmet.  Signed:  Santiago Bumpers, MD           12/01/2020

## 2020-12-02 LAB — CBC WITH DIFFERENTIAL/PLATELET
Absolute Monocytes: 1121 cells/uL — ABNORMAL HIGH (ref 200–950)
Basophils Absolute: 61 cells/uL (ref 0–200)
Basophils Relative: 0.6 %
Eosinophils Absolute: 212 cells/uL (ref 15–500)
Eosinophils Relative: 2.1 %
HCT: 41.5 % (ref 38.5–50.0)
Hemoglobin: 14.1 g/dL (ref 13.2–17.1)
Lymphs Abs: 2091 cells/uL (ref 850–3900)
MCH: 31.5 pg (ref 27.0–33.0)
MCHC: 34 g/dL (ref 32.0–36.0)
MCV: 92.6 fL (ref 80.0–100.0)
MPV: 11.7 fL (ref 7.5–12.5)
Monocytes Relative: 11.1 %
Neutro Abs: 6616 cells/uL (ref 1500–7800)
Neutrophils Relative %: 65.5 %
Platelets: 297 10*3/uL (ref 140–400)
RBC: 4.48 10*6/uL (ref 4.20–5.80)
RDW: 13.4 % (ref 11.0–15.0)
Total Lymphocyte: 20.7 %
WBC: 10.1 10*3/uL (ref 3.8–10.8)

## 2020-12-02 LAB — COMPREHENSIVE METABOLIC PANEL
AG Ratio: 1.5 (calc) (ref 1.0–2.5)
ALT: 19 U/L (ref 9–46)
AST: 20 U/L (ref 10–35)
Albumin: 3.8 g/dL (ref 3.6–5.1)
Alkaline phosphatase (APISO): 117 U/L (ref 35–144)
BUN: 17 mg/dL (ref 7–25)
CO2: 29 mmol/L (ref 20–32)
Calcium: 8.8 mg/dL (ref 8.6–10.3)
Chloride: 92 mmol/L — ABNORMAL LOW (ref 98–110)
Creat: 1.14 mg/dL (ref 0.70–1.30)
Globulin: 2.5 g/dL (calc) (ref 1.9–3.7)
Glucose, Bld: 93 mg/dL (ref 65–99)
Potassium: 4.1 mmol/L (ref 3.5–5.3)
Sodium: 130 mmol/L — ABNORMAL LOW (ref 135–146)
Total Bilirubin: 0.6 mg/dL (ref 0.2–1.2)
Total Protein: 6.3 g/dL (ref 6.1–8.1)

## 2020-12-02 LAB — TSH: TSH: 12.54 mIU/L — ABNORMAL HIGH (ref 0.40–4.50)

## 2020-12-04 NOTE — Progress Notes (Signed)
Yes this is correct.

## 2020-12-13 ENCOUNTER — Ambulatory Visit: Payer: BC Managed Care – PPO | Admitting: Family Medicine

## 2020-12-13 ENCOUNTER — Encounter: Payer: Self-pay | Admitting: Family Medicine

## 2020-12-13 ENCOUNTER — Other Ambulatory Visit: Payer: Self-pay

## 2020-12-13 VITALS — BP 147/73 | HR 62 | Temp 97.9°F | Wt 159.4 lb

## 2020-12-13 DIAGNOSIS — I10 Essential (primary) hypertension: Secondary | ICD-10-CM

## 2020-12-13 MED ORDER — SPIRONOLACTONE 50 MG PO TABS: ORAL_TABLET | ORAL | 1 refills | Status: DC

## 2020-12-13 NOTE — Progress Notes (Signed)
OFFICE VISIT  12/13/2020  CC:  Chief Complaint  Patient presents with   Follow-up    Hypertension, pt is not fasting.    HPI:    Patient is a 55 y.o. male who presents for 10d f/u uncontrolled HTN. A/P as of last visit: "#1 uncontrolled hypertension: Blood pressure is better on 50 mg twice a day Lopressor in addition to his losartan 100 mg a day.  However I am worried that it is making him fatigued.  We will continue with current dosing of both these medicines for now and will add spironolactone 50 mg a day.  We will check complete metabolic panel, CBC, and TSH today.   #2 diabetes, pretty well controlled.  Glucoses significantly improved and near normal--he will continue 20 units of Semglee once a day.  Also continue NovoLog 8 to 10 units before each meal. Next a1c after 02/02/21.   #3 fatigue: Possible side effect of beta-blocker.  As per #1 above, continue this med for now. Check cbc, cmet, and tsh as well."  INTERIM HX: Labs all good last visit except TSH up to 12.5. I recommended he increase his T4 dosing: 137 mcg tab M/W/F and 1 tab all other days.    Gaynelle Adu feels good.  He is less tired than last my saw him.  He has not increased his dose of levothyroxine yet.  We reviewed these instructions again today. He has not been able to check his blood pressure any at home since I saw him last because he has been out of town in [place].  He notes that his headaches have completely gone away.   Past Medical History:  Diagnosis Date   Back pain    s/p back surgery   Chronic hyponatremia 08/2012 onset   Onset was right after starting amitriptyline for his neuropathic leg pain--has been stable.  Likely ADH-like effect on kidney from amitriptyline.   COVID-19 virus infection 09/03/2020   History of low back pain 06/24/2011   Hyperlipidemia    Hypothyroidism    LADA (latent autoimmune diabetes in adults), managed as type 1 (HCC) Dx'd 2014   Dr. Morrison Old.   Migraines    Neuropathic  pain of right lower extremity    PLANTAR WART, RIGHT 10/09/2009   Snoring    Sleep study neg of OSA 04/2018   Tobacco dependence    quit 2012; restarted not long after    Past Surgical History:  Procedure Laterality Date   BACK SURGERY  2009   lower lumbar fusion- rubber disc in back   CARDIOVASCULAR STRESS TEST  03/21/14   No ischemia/low risk study; however, LV function showed EF 49% and global LV hypokinesis--echo recommended and it showed normal LV fxn.   NASAL SINUS SURGERY  2007   TRANSTHORACIC ECHOCARDIOGRAM  03/24/14   Normal LV fxn, EF 50-55%, no wall motion abnormalities: (grade 1 diast dysfxn)    Outpatient Medications Prior to Visit  Medication Sig Dispense Refill   amitriptyline (ELAVIL) 25 MG tablet TAKE ONE TABLET BY MOUTH EVERY MORNING AND TAKE THREE TABLETS at bedtime FOR right IN LEG PAIN 120 tablet 5   atorvastatin (LIPITOR) 20 MG tablet Take 1 tablet (20 mg total) by mouth daily. 90 tablet 3   buPROPion (WELLBUTRIN XL) 150 MG 24 hr tablet Take 1 tablet (150 mg total) by mouth daily. 90 tablet 3   citalopram (CELEXA) 40 MG tablet Take 1 tablet (40 mg total) by mouth daily. 90 tablet 3   Continuous  Blood Gluc Sensor (FREESTYLE LIBRE SENSOR SYSTEM) MISC USE 1 EACH TO APPLY  EVERY 14 DAYS. 2 each 99   fluticasone (FLONASE) 50 MCG/ACT nasal spray Place 2 sprays into both nostrils daily. 16 g 3   insulin aspart (NOVOLOG FLEXPEN) 100 UNIT/ML FlexPen 15 units qAC 15 mL 11   insulin glargine-yfgn (SEMGLEE, YFGN,) 100 UNIT/ML injection 20 U SQ qd 10 mL 11   Insulin Pen Needle (SURE COMFORT PEN NEEDLES) 32G X 4 MM MISC USE FOUR TIMES DAILY AS NEEDED 100 each 10   levothyroxine (SYNTHROID) 137 MCG tablet TAKE ONE TABLET BY MOUTH EVERY DAY BEFORE BREAKFAST 30 tablet 2   losartan (COZAAR) 100 MG tablet Take 1 tablet (100 mg total) by mouth daily. 90 tablet 3   meloxicam (MOBIC) 15 MG tablet TAKE ONE TABLET BY MOUTH EVERY DAY 30 tablet 0   metoprolol tartrate (LOPRESSOR) 50 MG  tablet Take 1 tablet (50 mg total) by mouth 2 (two) times daily. 60 tablet 1   pantoprazole (PROTONIX) 40 MG tablet Take 1 tablet (40 mg total) by mouth daily. 90 tablet 3   sildenafil (VIAGRA) 100 MG tablet Take 1 tablet (100 mg total) by mouth daily as needed for erectile dysfunction. 10 tablet 11   tamsulosin (FLOMAX) 0.4 MG CAPS capsule Take 1 capsule (0.4 mg total) by mouth daily after supper. 90 capsule 3   spironolactone (ALDACTONE) 50 MG tablet Take 1 tablet (50 mg total) by mouth daily. 30 tablet 1   No facility-administered medications prior to visit.    Allergies  Allergen Reactions   Amlodipine Other (See Comments)    Headache   Augmentin [Amoxicillin-Pot Clavulanate] Nausea And Vomiting   Oxycodone-Acetaminophen     REACTION: itching   Levothyroxine Rash    Had rash associated with generic levothyroxine, but not brand-name Synthroid.  Likely allergic to a dye or filler in the generic preparation.   Lisinopril Cough    ROS As per HPI  PE: Vitals with BMI 12/13/2020 12/01/2020 11/13/2020  Height - 5\' 4"  5\' 4"   Weight 159 lbs 6 oz 155 lbs 3 oz 154 lbs 13 oz  BMI 27.35 26.63 26.56  Systolic 147 137 213150  Diastolic 73 80 93  Pulse 62 74 84   Manual bp recheck 138/78  General: Alert and well-appearing. Cardiovascular: Regular rhythm and rate without murmur or rub or gallop.  Lungs are clear to auscultation bilaterally.  Extremities show no edema.  LABS:  Lab Results  Component Value Date   TSH 12.54 (H) 12/01/2020   Lab Results  Component Value Date   WBC 10.1 12/01/2020   HGB 14.1 12/01/2020   HCT 41.5 12/01/2020   MCV 92.6 12/01/2020   PLT 297 12/01/2020   Lab Results  Component Value Date   CREATININE 1.14 12/01/2020   BUN 17 12/01/2020   NA 130 (L) 12/01/2020   K 4.1 12/01/2020   CL 92 (L) 12/01/2020   CO2 29 12/01/2020   Lab Results  Component Value Date   ALT 19 12/01/2020   AST 20 12/01/2020   ALKPHOS 129 (H) 05/03/2020   BILITOT 0.6  12/01/2020   Lab Results  Component Value Date   CHOL 107 05/03/2020   Lab Results  Component Value Date   HDL 61.40 05/03/2020   Lab Results  Component Value Date   LDLCALC 38 05/03/2020   Lab Results  Component Value Date   TRIG 36.0 05/03/2020   Lab Results  Component Value Date  CHOLHDL 2 05/03/2020   Lab Results  Component Value Date   PSA 0.29 05/03/2020   PSA 0.23 12/24/2018   PSA 0.23 11/18/2017   Lab Results  Component Value Date   HGBA1C 7.1 (A) 11/03/2020   HGBA1C 7.1 11/03/2020   HGBA1C 7.1 (A) 11/03/2020   HGBA1C 7.1 (A) 11/03/2020     IMPRESSION AND PLAN:  1: Hypertension uncontrolled.  Diastolic now normal, but would like to reach goal of [id number] systolic. Increase spironolactone to 1.5 of the 50 mg tabs a day.  Continue metoprolol 80 mg twice a day and losartan 100 mg a day.  Check BMP today. As long as electrolytes and creatinine are okay we will give him instructions to titrate his spironolactone up to 2 full 50 mg tabs a day if blood pressures not normal in the next 3 to 4 days.  #2:  Hypothyroidism.  Recent TSH just a little elevated at 12.5. He will start his increased dose of 1-1/2 of the 137 mcg tabs on Mondays Wednesdays and Fridays, continue 1 tab a day on all other days.  TSH and a1c rpt in 2 mo  An After Visit Summary was printed and given to the patient.  FOLLOW UP: Return in about 2 weeks (around 12/27/2020) for f/u htn.  Signed:  Crissie Sickles, MD           12/13/2020

## 2020-12-14 LAB — BASIC METABOLIC PANEL
BUN: 14 mg/dL (ref 6–23)
CO2: 27 mEq/L (ref 19–32)
Calcium: 8.6 mg/dL (ref 8.4–10.5)
Chloride: 88 mEq/L — ABNORMAL LOW (ref 96–112)
Creatinine, Ser: 1.14 mg/dL (ref 0.40–1.50)
GFR: 72.19 mL/min (ref >60.00)
Glucose, Bld: 178 mg/dL — ABNORMAL HIGH (ref 70–99)
Potassium: 4.5 mEq/L (ref 3.5–5.1)
Sodium: 122 mEq/L — ABNORMAL LOW (ref 135–145)

## 2020-12-19 ENCOUNTER — Other Ambulatory Visit: Payer: Self-pay | Admitting: Family Medicine

## 2020-12-20 ENCOUNTER — Other Ambulatory Visit: Payer: Self-pay | Admitting: Family Medicine

## 2020-12-22 ENCOUNTER — Telehealth: Payer: Self-pay | Admitting: Family Medicine

## 2020-12-22 MED ORDER — SPIRONOLACTONE 50 MG PO TABS: ORAL_TABLET | ORAL | 1 refills | Status: DC

## 2020-12-22 NOTE — Telephone Encounter (Signed)
Pt called requesting call back from [place]. He said the pharmacy didn't receive the "new" medication sent 11/2.   Appears it was a change for spironolactone (ALDACTONE) 50 MG tablet   Call back # [phone]

## 2020-12-22 NOTE — Telephone Encounter (Signed)
Pt advised refill resent and should be available for pick up today.

## 2020-12-27 ENCOUNTER — Encounter: Payer: Self-pay | Admitting: Family Medicine

## 2020-12-27 ENCOUNTER — Ambulatory Visit: Payer: BC Managed Care – PPO | Admitting: Family Medicine

## 2020-12-27 ENCOUNTER — Other Ambulatory Visit: Payer: Self-pay

## 2020-12-27 VITALS — BP 90/51 | HR 79 | Temp 98.7°F | Resp 16 | Ht 64.0 in | Wt 152.0 lb

## 2020-12-27 DIAGNOSIS — E139 Other specified diabetes mellitus without complications: Secondary | ICD-10-CM

## 2020-12-27 DIAGNOSIS — T50905A Adverse effect of unspecified drugs, medicaments and biological substances, initial encounter: Secondary | ICD-10-CM

## 2020-12-27 DIAGNOSIS — R5383 Other fatigue: Secondary | ICD-10-CM

## 2020-12-27 DIAGNOSIS — I10 Essential (primary) hypertension: Secondary | ICD-10-CM | POA: Diagnosis not present

## 2020-12-27 DIAGNOSIS — E871 Hypo-osmolality and hyponatremia: Secondary | ICD-10-CM

## 2020-12-27 MED ORDER — SPIRONOLACTONE 50 MG PO TABS: ORAL_TABLET | ORAL | 1 refills | Status: DC

## 2020-12-27 MED ORDER — AMITRIPTYLINE HCL 25 MG PO TABS: ORAL_TABLET | ORAL | 5 refills | Status: DC

## 2020-12-27 NOTE — Progress Notes (Deleted)
OFFICE VISIT  12/27/2020  CC:  Chief Complaint  Patient presents with   Follow-up    Hypertension, pt is not fasting. Blood sugar is 112.   HPI:    Patient is a 55 y.o. male who presents for 2 wk f/u uncontrolled HTN. A/P as of last visit: "1: Hypertension uncontrolled.  Diastolic now normal, but would like to reach goal of 130 systolic. Increase spironolactone to 1.5 of the 50 mg tabs a day.  Continue metoprolol 50 mg twice a day and losartan 100 mg a day.  Check BMP today. As long as electrolytes and creatinine are okay we will give him instructions to titrate his spironolactone up to 2 full 50 mg tabs a day if blood pressures not normal in the next 3 to 4 days.  #2:  Hypothyroidism.  Recent TSH just a little elevated at 12.5. He will start his increased dose of 1-1/2 of the 137 mcg tabs on Mondays Wednesdays and Fridays, continue 1 tab a day on all other days.   TSH and a1c rpt in 2 mo"  INTERIM HX: Labs were stable last visit. He has been having significant fatigue since I saw him last.  Denies dizziness, fever, cough, shortness of breath, nausea, vomiting, or diarrhea. Home blood pressures show average around 140 over 70s to 80s.  Heart rate 70s average.  He has increased his spironolactone to 2 of the 50 mg tabs a day.  Glucoses have been more steady, only occasional low.  Takes 30 units of Semglee every morning, takes an average of 6 to 8 units of NovoLog with each meal.  Says he drinks approximately 65 to 70 ounces of water daily.  Drinks 1 diet Pepsi daily. Takes amitriptyline 25 mg, 3 tabs at bedtime for chronic neuropathic pain in legs.  Past Medical History:  Diagnosis Date   Back pain    s/p back surgery   Chronic hyponatremia 08/2012 onset   Onset was right after starting amitriptyline for his neuropathic leg pain--has been stable.  Likely ADH-like effect on kidney from amitriptyline.   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 Morrison Old.   Migraines    Neuropathic pain of right lower extremity    PLANTAR WART, RIGHT 10/09/2009   Snoring    Sleep study neg of OSA 04/2018   Tobacco dependence    quit 2012; restarted not long after    Past Surgical History:  Procedure Laterality Date   BACK SURGERY  2009   lower lumbar fusion- rubber disc in back   CARDIOVASCULAR STRESS TEST  03/21/14   No ischemia/low risk study; however, LV function showed EF 49% and global LV hypokinesis--echo recommended and it showed normal LV fxn.   NASAL SINUS SURGERY  2007   TRANSTHORACIC ECHOCARDIOGRAM  03/24/14   Normal LV fxn, EF 50-55%, no wall motion abnormalities: (grade 1 diast dysfxn)    Outpatient Medications Prior to Visit  Medication Sig Dispense Refill   atorvastatin (LIPITOR) 20 MG tablet Take 1 tablet (20 mg total) by mouth daily. 90 tablet 3   buPROPion (WELLBUTRIN XL) 150 MG 24 hr tablet Take 1 tablet (150 mg total) by mouth daily. 90 tablet 3   citalopram (CELEXA) 40 MG tablet Take 1 tablet (40 mg total) by mouth daily. 90 tablet 3   Continuous  Blood Gluc Sensor (FREESTYLE LIBRE SENSOR SYSTEM) MISC USE 1 EACH TO APPLY  EVERY 14 DAYS. 2 each 99   fluticasone (FLONASE) 50 MCG/ACT nasal spray Place 2 sprays into both nostrils daily. 16 g 3   insulin aspart (NOVOLOG FLEXPEN) 100 UNIT/ML FlexPen 15 units qAC 15 mL 11   insulin glargine-yfgn (SEMGLEE, YFGN,) 100 UNIT/ML injection 20 U SQ qd 10 mL 11   Insulin Pen Needle (SURE COMFORT PEN NEEDLES) 32G X 4 MM MISC USE FOUR TIMES DAILY AS NEEDED 100 each 10   levothyroxine (SYNTHROID) 137 MCG tablet TAKE ONE TABLET BY MOUTH EVERY DAY BEFORE BREAKFAST 30 tablet 2   losartan (COZAAR) 100 MG tablet Take 1 tablet (100 mg total) by mouth daily. 90 tablet 3   meloxicam (MOBIC) 15 MG tablet TAKE ONE TABLET BY MOUTH EVERY DAY 30 tablet 0   pantoprazole (PROTONIX) 40  MG tablet Take 1 tablet (40 mg total) by mouth daily. 90 tablet 3   sildenafil (VIAGRA) 100 MG tablet Take 1 tablet (100 mg total) by mouth daily as needed for erectile dysfunction. 10 tablet 11   tamsulosin (FLOMAX) 0.4 MG CAPS capsule Take 1 capsule (0.4 mg total) by mouth daily after supper. 90 capsule 3   amitriptyline (ELAVIL) 25 MG tablet TAKE ONE TABLET BY MOUTH EVERY MORNING AND TAKE THREE TABLETS at bedtime FOR right IN LEG PAIN 120 tablet 5   metoprolol tartrate (LOPRESSOR) 50 MG tablet Take 1 tablet (50 mg total) by mouth 2 (two) times daily. 60 tablet 1   spironolactone (ALDACTONE) 50 MG tablet 1 and 1/2 tabs po qd 45 tablet 1   No facility-administered medications prior to visit.    Allergies  Allergen Reactions   Amlodipine Other (See Comments)    Headache   Augmentin [Amoxicillin-Pot Clavulanate] Nausea And Vomiting   Oxycodone-Acetaminophen     REACTION: itching   Levothyroxine Rash    Had rash associated with generic levothyroxine, but not brand-name Synthroid.  Likely allergic to a dye or filler in the generic preparation.   Lisinopril Cough    ROS As per HPI  PE: Vitals with BMI 12/27/2020 12/13/2020 12/01/2020  Height 5\' 4"  - 5\' 4"   Weight 152 lbs 159 lbs 6 oz 155 lbs 3 oz  BMI 26.08 [id number]  Systolic 90 [id number]  Diastolic 51 73 80  Pulse 79 62 74   Gen: Alert, well appearing.  Patient is oriented to person, place, time, and situation. AFFECT: pleasant, lucid thought and speech. CV: RRR, no m/r/g.   LUNGS: CTA bilat, nonlabored resps, good aeration in all lung fields.   LABS:  Lab Results  Component Value Date   TSH 12.54 (H) 12/01/2020     Chemistry      Component Value Date/Time   NA 122 (L) 12/13/2020 1608   NA 123 (A) 09/15/2018 0000   K 4.5 12/13/2020 1608   CL 88 (L) 12/13/2020 1608   CO2 27 12/13/2020 1608   BUN 14 12/13/2020 1608   CREATININE 1.14 12/13/2020 1608   CREATININE 1.14 12/01/2020 1549   GLU 231 09/15/2018 0000       Component Value Date/Time   CALCIUM 8.6 12/13/2020 1608   ALKPHOS 129 (H) 05/03/2020 0955   AST 20 12/01/2020 1549   ALT 19 12/01/2020 1549   BILITOT 0.6 12/01/2020 1549      IMPRESSION AND PLAN:  #1 fatigue.  This had started when we started him on metoprolol but last visit we thought  maybe he was getting better.  Also could be due to increasing spironolactone from 50 to 100 mg a day. PLAN:  Stop metoprolol. OK to continue spironolactone 50mg , 2 tabs daily. OK to continue losartan 100 mg daily. Continue to monitor bp and HR 1-2 times a day. Call or send mychart message in 2d with update.  #2 hypertension, poor control.  Home measurements show mild elevation but today's measurement in the office is low.  See #1 above.  #3 chronic hyponatremia.  He is at this long-term and it is ranged anywhere from 1 22-1 30 over the last 8 years.  Suspected that thivery unlikely that this is contributing to his fatigue lately. Hypothyroidism.  Recent TSH (12/01/20) just a little elevated at 12.5. He started his increased dose about 2 wks ago--> 1-1/2 of the 137 mcg tabs on Mondays Wednesdays and Fridays, continue 1 tab a day on all other days.   #4 LADA.  Good control on Semglee 30 units a day and NovoLog 6 to 8 units before every meal.  TSH and a1c rpt in 6 wks.  An After Visit Summary was printed and given to the patient.  FOLLOW UP: Return for to be determined based on response to today's changes and labs.  Signed:  Crissie Sickles, MD           12/27/2020

## 2020-12-27 NOTE — Progress Notes (Signed)
OFFICE VISIT  12/27/2020  CC:  Chief Complaint  Patient presents with   Follow-up    Hypertension, pt is not fasting. Blood sugar is 112.   HPI:    Patient is a 55 y.o. male who presents for 2 wk f/u uncontrolled HTN. A/P as of last visit: "1: Hypertension uncontrolled.  Diastolic now normal, but would like to reach goal of [id number] systolic. Increase spironolactone to 1.5 of the 50 mg tabs a day.  Continue metoprolol 50 mg twice a day and losartan 100 mg a day.  Check BMP today. As long as electrolytes and creatinine are okay we will give him instructions to titrate his spironolactone up to 2 full 50 mg tabs a day if blood pressures not normal in the next 3 to 4 days.  #2:  Hypothyroidism.  Recent TSH just a little elevated at 12.5. He will start his increased dose of 1-1/2 of the 137 mcg tabs on Mondays Wednesdays and Fridays, continue 1 tab a day on all other days.   TSH and a1c rpt in 2 mo"  INTERIM HX: Labs were stable last visit.  Heath Lark reports progressive fatigue since I last saw him.  We had initially felt like metoprolol was causing this but he seemed better last visit. Home blood pressures 140s over 80s for the most part.  Has increased spironolactone to 2 of the 50 mg tabs a day. No dizziness, headaches, shortness of breath, chest pain, or lower extremity changes.  He says he sleeps very well and wakes up feeling pretty good but then as the day goes on gets very tired.  Home glucoses normal for the most part.  Occasionally has a low where he feels like he has to eat something pretty quick.  Does not seem to be any pattern of this.  He is eating all throughout his day.  He does drink about 65 to 70 ounces of water a day, 1 diet Pepsi.  ROS as above, plus--> no fevers, no wheezing, no cough, no rashes, no melena/hematochezia.  No polyuria or polydipsia.  No myalgias or arthralgias.  No focal weakness, paresthesias, or tremors.  No acute vision or hearing abnormalities.  No  dysuria or unusual/new urinary urgency or frequency.  No recent changes in lower legs. No n/v/d or abd pain.  No palpitations.    Past Medical History:  Diagnosis Date   Back pain    s/p back surgery   Chronic hyponatremia 08/2012 onset   Onset was right after starting amitriptyline for his neuropathic leg pain--has been stable.  Likely ADH-like effect on kidney from amitriptyline.   COVID-19 virus infection 09/03/2020   History of low back pain 06/24/2011   Hyperlipidemia    Hypothyroidism    LADA (latent autoimmune diabetes in adults), managed as type 1 ([place]) Dx'd 2014   Dr. Steffanie Dunn.   Migraines    Neuropathic pain of right lower extremity    PLANTAR WART, RIGHT 10/09/2009   Snoring    Sleep study neg of OSA 04/2018   Tobacco dependence    quit 2012; restarted not long after    Past Surgical History:  Procedure Laterality Date   BACK SURGERY  2009   lower lumbar fusion- rubber disc in back   CARDIOVASCULAR STRESS TEST  03/21/14   No ischemia/low risk study; however, LV function showed EF 49% and global LV hypokinesis--echo recommended and it showed normal LV fxn.   NASAL SINUS SURGERY  2007   TRANSTHORACIC ECHOCARDIOGRAM  03/24/14  Normal LV fxn, EF 50-55%, no wall motion abnormalities: (grade 1 diast dysfxn)    Outpatient Medications Prior to Visit  Medication Sig Dispense Refill   atorvastatin (LIPITOR) 20 MG tablet Take 1 tablet (20 mg total) by mouth daily. 90 tablet 3   buPROPion (WELLBUTRIN XL) 150 MG 24 hr tablet Take 1 tablet (150 mg total) by mouth daily. 90 tablet 3   citalopram (CELEXA) 40 MG tablet Take 1 tablet (40 mg total) by mouth daily. 90 tablet 3   Continuous Blood Gluc Sensor (FREESTYLE LIBRE SENSOR SYSTEM) MISC USE 1 EACH TO APPLY  EVERY 14 DAYS. 2 each 99   fluticasone (FLONASE) 50 MCG/ACT nasal spray Place 2 sprays into both nostrils daily. 16 g 3   insulin aspart (NOVOLOG FLEXPEN) 100 UNIT/ML FlexPen 15 units qAC 15 mL 11   insulin glargine-yfgn  (SEMGLEE, YFGN,) 100 UNIT/ML injection 20 U SQ qd 10 mL 11   Insulin Pen Needle (SURE COMFORT PEN NEEDLES) 32G X 4 MM MISC USE FOUR TIMES DAILY AS NEEDED 100 each 10   levothyroxine (SYNTHROID) 137 MCG tablet TAKE ONE TABLET BY MOUTH EVERY DAY BEFORE BREAKFAST 30 tablet 2   losartan (COZAAR) 100 MG tablet Take 1 tablet (100 mg total) by mouth daily. 90 tablet 3   meloxicam (MOBIC) 15 MG tablet TAKE ONE TABLET BY MOUTH EVERY DAY 30 tablet 0   pantoprazole (PROTONIX) 40 MG tablet Take 1 tablet (40 mg total) by mouth daily. 90 tablet 3   sildenafil (VIAGRA) 100 MG tablet Take 1 tablet (100 mg total) by mouth daily as needed for erectile dysfunction. 10 tablet 11   tamsulosin (FLOMAX) 0.4 MG CAPS capsule Take 1 capsule (0.4 mg total) by mouth daily after supper. 90 capsule 3   amitriptyline (ELAVIL) 25 MG tablet TAKE ONE TABLET BY MOUTH EVERY MORNING AND TAKE THREE TABLETS at bedtime FOR right IN LEG PAIN 120 tablet 5   metoprolol tartrate (LOPRESSOR) 50 MG tablet Take 1 tablet (50 mg total) by mouth 2 (two) times daily. 60 tablet 1   spironolactone (ALDACTONE) 50 MG tablet 1 and 1/2 tabs po qd 45 tablet 1   No facility-administered medications prior to visit.    Allergies  Allergen Reactions   Amlodipine Other (See Comments)    Headache   Augmentin [Amoxicillin-Pot Clavulanate] Nausea And Vomiting   Oxycodone-Acetaminophen     REACTION: itching   Levothyroxine Rash    Had rash associated with generic levothyroxine, but not brand-name Synthroid.  Likely allergic to a dye or filler in the generic preparation.   Lisinopril Cough    ROS As per HPI  PE: Vitals with BMI 12/27/2020 12/13/2020 12/01/2020  Height 5\' 4"  - 5\' 4"   Weight 152 lbs 159 lbs 6 oz 155 lbs 3 oz  BMI 26.08 27.35 26.63  Systolic 90 147 137  Diastolic 51 73 80  Pulse 79 62 74   Gen: Alert, well appearing.  Patient is oriented to person, place, time, and situation. AFFECT: pleasant, lucid thought and speech. CV: RRR,  no m/r/g.   LUNGS: CTA bilat, nonlabored resps, good aeration in all lung fields. EXT: no clubbing or cyanosis.  no edema.    LABS:  Lab Results  Component Value Date   TSH 12.54 (H) 12/01/2020     Chemistry      Component Value Date/Time   NA 122 (L) 12/13/2020 1608   NA 123 (A) 09/15/2018 0000   K 4.5 12/13/2020 1608   CL 88 (  L) 12/13/2020 1608   CO2 27 12/13/2020 1608   BUN 14 12/13/2020 1608   CREATININE 1.14 12/13/2020 1608   CREATININE 1.14 12/01/2020 1549   GLU 231 09/15/2018 0000      Component Value Date/Time   CALCIUM 8.6 12/13/2020 1608   ALKPHOS 129 (H) 05/03/2020 0955   AST 20 12/01/2020 1549   ALT 19 12/01/2020 1549   BILITOT 0.6 12/01/2020 1549      IMPRESSION AND PLAN:  #1 fatigue.  Metoprolol has caused this for him.  We were not sure if this was just an initial reaction last visit or not.  Also have to think about spironolactone being doubled last visit as the culprit. Plan: Stop metoprolol.  Continue spironolactone 100 mg a day and losartan 100 mg a day.  Continue home blood pressure monitoring.  Send me a MyChart message or call in 2 days with report of how he is doing.  2.  Uncontrolled hypertension.  See #1 above. Electrolytes and creatinine monitoring today.  3 chronic hyponatremia.  For about the last 8 years his sodium has ranged anywhere from 122-130.  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  I did ask him today to drink more Gatorade 0 and less water during his day.  However, I do not think his hyponatremia is causing any symptoms.  4.  LADA.  Good control on Semglee 30 units once a day and NovoLog 6 to 8 units before every meal. Repeat A1c in about 6 weeks.  5. Hypothyroidism.  Recent TSH (12/01/20) just a little elevated at 12.5. He started his increased dose about 2 wks ago--> 1-1/2 of the 137 mcg tabs on Mondays Wednesdays and Fridays, continue 1 tab a day on all other days. Repeat TSH in about 6 weeks.  An  After Visit Summary was printed and given to the patient.  FOLLOW UP: Return for to be determined based on response to today's changes and labs.  Signed:  Crissie Sickles, MD           12/27/2020

## 2020-12-27 NOTE — Patient Instructions (Addendum)
Stop metoprolol.  OK to continue spironolactone 50mg , 2 tabs daily. OK to continue losartan 100 mg daily.  Continue to monitor bp and HR 1-2 times a day.  Call or send mychart message in 2d with update.

## 2020-12-28 ENCOUNTER — Other Ambulatory Visit: Payer: Self-pay | Admitting: Family Medicine

## 2020-12-28 ENCOUNTER — Telehealth: Payer: Self-pay

## 2020-12-28 DIAGNOSIS — E871 Hypo-osmolality and hyponatremia: Secondary | ICD-10-CM

## 2020-12-28 LAB — BASIC METABOLIC PANEL
BUN: 21 mg/dL (ref 6–23)
CO2: 27 mEq/L (ref 19–32)
Calcium: 9.1 mg/dL (ref 8.4–10.5)
Chloride: 82 mEq/L — ABNORMAL LOW (ref 96–112)
Creatinine, Ser: 1.36 mg/dL (ref 0.40–1.50)
GFR: 58.4 mL/min — ABNORMAL LOW (ref >60.00)
Glucose, Bld: 101 mg/dL — ABNORMAL HIGH (ref 70–99)
Potassium: 4.7 mEq/L (ref 3.5–5.1)
Sodium: 115 mEq/L — CL (ref 135–145)

## 2020-12-28 NOTE — Telephone Encounter (Signed)
This message was addressed via result note.

## 2020-12-28 NOTE — Telephone Encounter (Signed)
CRITICAL VALUE STICKER  CRITICAL VALUE: sodium level 115  RECEIVER (on-site recipient of call): Erie Noe  DATE & TIME NOTIFIED: [phone]  MESSENGER (representative from lab): Allene Pyo  MD NOTIFIED:[place]  TIME OF NOTIFICATION:1617

## 2020-12-29 ENCOUNTER — Other Ambulatory Visit: Payer: Self-pay

## 2020-12-29 DIAGNOSIS — E871 Hypo-osmolality and hyponatremia: Secondary | ICD-10-CM

## 2021-01-01 ENCOUNTER — Ambulatory Visit: Payer: BC Managed Care – PPO

## 2021-01-01 ENCOUNTER — Other Ambulatory Visit: Payer: Self-pay

## 2021-01-01 DIAGNOSIS — E871 Hypo-osmolality and hyponatremia: Secondary | ICD-10-CM

## 2021-01-01 NOTE — Progress Notes (Signed)
Pt came for lab visit and had BP checked as well  117/72 80

## 2021-01-03 ENCOUNTER — Encounter: Payer: Self-pay | Admitting: Family Medicine

## 2021-01-05 LAB — OSMOLALITY: Osmolality: 251 mOsm/kg — ABNORMAL LOW (ref 278–305)

## 2021-01-05 LAB — OSMOLALITY, URINE: Osmolality, Ur: 341 mOsm/kg (ref 50–1200)

## 2021-01-05 LAB — CORTISOL: Cortisol, Plasma: 13.2 ug/dL

## 2021-01-05 LAB — TEST AUTHORIZATION

## 2021-01-05 LAB — SODIUM, URINE, RANDOM: Sodium, Ur: 67 mmol/L (ref 28–272)

## 2021-01-07 ENCOUNTER — Encounter: Payer: Self-pay | Admitting: Family Medicine

## 2021-01-08 ENCOUNTER — Other Ambulatory Visit: Payer: Self-pay | Admitting: Family Medicine

## 2021-01-08 DIAGNOSIS — E222 Syndrome of inappropriate secretion of antidiuretic hormone: Secondary | ICD-10-CM

## 2021-01-08 DIAGNOSIS — E871 Hypo-osmolality and hyponatremia: Secondary | ICD-10-CM

## 2021-01-08 DIAGNOSIS — I9589 Other hypotension: Secondary | ICD-10-CM

## 2021-01-08 DIAGNOSIS — R5383 Other fatigue: Secondary | ICD-10-CM

## 2021-01-08 NOTE — Telephone Encounter (Signed)
Please advise on message below.

## 2021-01-09 ENCOUNTER — Other Ambulatory Visit: Payer: Self-pay | Admitting: Family Medicine

## 2021-01-09 DIAGNOSIS — E039 Hypothyroidism, unspecified: Secondary | ICD-10-CM

## 2021-01-09 DIAGNOSIS — R5382 Chronic fatigue, unspecified: Secondary | ICD-10-CM

## 2021-01-09 DIAGNOSIS — E222 Syndrome of inappropriate secretion of antidiuretic hormone: Secondary | ICD-10-CM

## 2021-01-09 DIAGNOSIS — E871 Hypo-osmolality and hyponatremia: Secondary | ICD-10-CM

## 2021-01-09 NOTE — Telephone Encounter (Signed)
Daniel Stevenson, CMA  01/09/2021 10:25 AM EST     Spoke with pt regarding results/recommendations,voiced understanding. Lab visit scheduled for Friday 12/2

## 2021-01-09 NOTE — Telephone Encounter (Signed)
I sent a result note to Erie Noe at the end of the day yesterday about this. Can someone pick that up where we left off?  Basically he needs to be contacted to come in as soon as possible for some labs that I ordered AND I ordered a referral to an endocrinologist to further evaluate his low sodium diagnosis.

## 2021-01-09 NOTE — Telephone Encounter (Signed)
Patient returning call.  Please call whenever is available.

## 2021-01-12 ENCOUNTER — Other Ambulatory Visit: Payer: Self-pay

## 2021-01-12 ENCOUNTER — Ambulatory Visit (INDEPENDENT_AMBULATORY_CARE_PROVIDER_SITE_OTHER): Payer: BC Managed Care – PPO

## 2021-01-12 DIAGNOSIS — R5382 Chronic fatigue, unspecified: Secondary | ICD-10-CM | POA: Diagnosis not present

## 2021-01-12 DIAGNOSIS — E039 Hypothyroidism, unspecified: Secondary | ICD-10-CM | POA: Diagnosis not present

## 2021-01-12 DIAGNOSIS — E222 Syndrome of inappropriate secretion of antidiuretic hormone: Secondary | ICD-10-CM | POA: Diagnosis not present

## 2021-01-12 DIAGNOSIS — E871 Hypo-osmolality and hyponatremia: Secondary | ICD-10-CM | POA: Diagnosis not present

## 2021-01-12 LAB — FOLLICLE STIMULATING HORMONE: FSH: 18.3 m[IU]/mL — ABNORMAL HIGH (ref 1.4–18.1)

## 2021-01-12 LAB — BASIC METABOLIC PANEL
BUN: 23 mg/dL (ref 6–23)
CO2: 28 mEq/L (ref 19–32)
Calcium: 8.7 mg/dL (ref 8.4–10.5)
Chloride: 87 mEq/L — ABNORMAL LOW (ref 96–112)
Creatinine, Ser: 1.06 mg/dL (ref 0.40–1.50)
GFR: 78.73 mL/min (ref >60.00)
Glucose, Bld: 88 mg/dL (ref 70–99)
Potassium: 5.2 mEq/L — ABNORMAL HIGH (ref 3.5–5.1)
Sodium: 122 mEq/L — ABNORMAL LOW (ref 135–145)

## 2021-01-12 LAB — T4, FREE: Free T4: 1.23 ng/dL (ref 0.60–1.60)

## 2021-01-12 LAB — TSH: TSH: 1.84 u[IU]/mL (ref 0.35–5.50)

## 2021-01-12 LAB — LUTEINIZING HORMONE: LH: 8.94 m[IU]/mL (ref 1.50–9.30)

## 2021-01-13 LAB — TESTOSTERONE TOTAL,FREE,BIO, MALES
Albumin: 3.7 g/dL (ref 3.6–5.1)
Sex Hormone Binding: 68 nmol/L (ref 22–77)
Testosterone, Bioavailable: 82.1 ng/dL — ABNORMAL LOW (ref 110.0–575.0)
Testosterone, Free: 48 pg/mL (ref 46.0–224.0)
Testosterone: 642 ng/dL (ref 250–827)

## 2021-01-13 LAB — T3: T3, Total: 91 ng/dL (ref 76–181)

## 2021-01-13 LAB — PROLACTIN: Prolactin: 10.5 ng/mL (ref 2.0–18.0)

## 2021-01-30 ENCOUNTER — Other Ambulatory Visit: Payer: Self-pay | Admitting: Family Medicine

## 2021-01-31 NOTE — Telephone Encounter (Signed)
Please advise if okay due to recent labs?

## 2021-02-01 MED ORDER — MELOXICAM 15 MG PO TABS: 15.0000 mg | ORAL_TABLET | Freq: Every day | ORAL | 5 refills | Status: DC

## 2021-02-01 NOTE — Telephone Encounter (Signed)
Yes, ok. I did RF.

## 2021-02-24 ENCOUNTER — Other Ambulatory Visit: Payer: Self-pay | Admitting: Family Medicine

## 2021-03-19 ENCOUNTER — Encounter: Payer: Self-pay | Admitting: Family Medicine

## 2021-03-19 ENCOUNTER — Ambulatory Visit: Payer: BC Managed Care – PPO | Admitting: Family Medicine

## 2021-03-19 ENCOUNTER — Other Ambulatory Visit: Payer: Self-pay

## 2021-03-19 VITALS — BP 124/72 | HR 82 | Temp 97.6°F | Ht 64.0 in | Wt 158.4 lb

## 2021-03-19 DIAGNOSIS — R059 Cough, unspecified: Secondary | ICD-10-CM | POA: Diagnosis not present

## 2021-03-19 DIAGNOSIS — R490 Dysphonia: Secondary | ICD-10-CM

## 2021-03-19 DIAGNOSIS — J40 Bronchitis, not specified as acute or chronic: Secondary | ICD-10-CM | POA: Diagnosis not present

## 2021-03-19 LAB — POCT INFLUENZA A/B
Influenza A, POC: NEGATIVE
Influenza B, POC: NEGATIVE

## 2021-03-19 LAB — POC COVID19 BINAXNOW: SARS Coronavirus 2 Ag: NEGATIVE

## 2021-03-19 LAB — POCT RAPID STREP A (OFFICE): Rapid Strep A Screen: NEGATIVE

## 2021-03-19 MED ORDER — PREDNISONE 20 MG PO TABS: ORAL_TABLET | ORAL | 0 refills | Status: DC

## 2021-03-19 NOTE — Progress Notes (Addendum)
OFFICE VISIT  03/19/2021  CC:  Chief Complaint  Patient presents with   Sore Throat    1 month and a half. Pt also c/o headache off and on. Started taking Nightquil in January but only used a few tines.    HPI:    Patient is a 56 y.o. male who presents for Hoarseness and his voice and URI symptoms  INTERIM HX: Patient states that he has experience, hoarseness, productive, cough, mild congestion, and a sore throat since Christmas--5 wks ago. He does not recall his symptoms, lessening or worsening. He has used cough drops, and Lifesavers to soothe his sore throat.    Past Medical History:  Diagnosis Date   Back pain    s/p back surgery   Chronic hyponatremia 08/2012 onset   Onset was right after starting amitriptyline for his neuropathic leg pain--has been stable.  Likely ADH-like effect on kidney from amitriptyline.   COVID-19 virus infection 09/03/2020   History of low back pain 06/24/2011   Hyperlipidemia    Hypothyroidism    LADA (latent autoimmune diabetes in adults), managed as type 1 ([place]) Dx'd 2014   Dr. Steffanie Dunn.   Migraines    Neuropathic pain of right lower extremity    PLANTAR WART, RIGHT 10/09/2009   Snoring    Sleep study neg  OSA 04/2018   Tobacco dependence    quit 2012; restarted not long after    Past Surgical History:  Procedure Laterality Date   BACK SURGERY  02/12/2007   lower lumbar fusion- rubber disc in back   CARDIOVASCULAR STRESS TEST  03/21/2014   No ischemia/low risk study; however, LV function showed EF 49% and global LV hypokinesis--echo recommended and it showed normal LV fxn.   NASAL SINUS SURGERY  02/11/2005   Polysomnogram  04/2018   NO OSA (Dr. Rexene Alberts)   TRANSTHORACIC ECHOCARDIOGRAM  03/24/2014   Normal LV fxn, EF 50-55%, no wall motion abnormalities: (grade 1 diast dysfxn)    Outpatient Medications Prior to Visit  Medication Sig Dispense Refill   amitriptyline (ELAVIL) 25 MG tablet 3 tabs po qhs for legs pain 90 tablet 5    atorvastatin (LIPITOR) 20 MG tablet Take 1 tablet (20 mg total) by mouth daily. 90 tablet 3   buPROPion (WELLBUTRIN XL) 150 MG 24 hr tablet Take 1 tablet (150 mg total) by mouth daily. 90 tablet 3   citalopram (CELEXA) 40 MG tablet Take 1 tablet (40 mg total) by mouth daily. 90 tablet 3   Continuous Blood Gluc Sensor (FREESTYLE LIBRE SENSOR SYSTEM) MISC USE 1 EACH TO APPLY  EVERY 14 DAYS. 2 each 99   fluticasone (FLONASE) 50 MCG/ACT nasal spray Place 2 sprays into both nostrils daily. 16 g 3   insulin aspart (NOVOLOG FLEXPEN) 100 UNIT/ML FlexPen 15 units qAC 15 mL 11   insulin glargine-yfgn (SEMGLEE, YFGN,) 100 UNIT/ML injection 20 U SQ qd 10 mL 11   Insulin Pen Needle (SURE COMFORT PEN NEEDLES) 32G X 4 MM MISC USE FOUR TIMES DAILY AS NEEDED 100 each 10   losartan (COZAAR) 100 MG tablet Take 1 tablet (100 mg total) by mouth daily. 90 tablet 3   meloxicam (MOBIC) 15 MG tablet Take 1 tablet (15 mg total) by mouth daily. 30 tablet 5   pantoprazole (PROTONIX) 40 MG tablet Take 1 tablet (40 mg total) by mouth daily. 90 tablet 3   sildenafil (VIAGRA) 100 MG tablet Take 1 tablet (100 mg total) by mouth daily as needed for erectile dysfunction.  10 tablet 11   tamsulosin (FLOMAX) 0.4 MG CAPS capsule Take 1 capsule (0.4 mg total) by mouth daily after supper. 90 capsule 3   levothyroxine (SYNTHROID) 137 MCG tablet TAKE ONE TABLET BY MOUTH EVERY DAY BEFORE BREAKFAST (Patient not taking: Reported on 03/19/2021) 30 tablet 2   spironolactone (ALDACTONE) 50 MG tablet take 1 1/2 tablets by mouth daily (Patient not taking: Reported on 03/19/2021) 45 tablet 1   No facility-administered medications prior to visit.    Allergies  Allergen Reactions   Amlodipine Other (See Comments)    Headache   Augmentin [Amoxicillin-Pot Clavulanate] Nausea And Vomiting   Oxycodone-Acetaminophen     REACTION: itching   Levothyroxine Rash    Had rash associated with generic levothyroxine, but not brand-name Synthroid.  Likely  allergic to a dye or filler in the generic preparation.   Lisinopril Cough    Review of Systems  Constitutional:  Negative for chills, fever and weight loss.  Respiratory:  Positive for cough, sputum production and wheezing.   Cardiovascular:  Negative for chest pain.  Gastrointestinal:  Negative for abdominal pain, constipation, diarrhea and vomiting.  Musculoskeletal:  Negative for myalgias.  PE: Vitals with BMI 03/19/2021 01/01/2021 12/27/2020  Height 5\' 4"  - 5\' 4"   Weight 158 lbs 6 oz - 152 lbs  BMI [id number] - [id number]  Systolic [id number] 90  Diastolic 72 72 51  Pulse 82 80 79     Physical Exam Constitutional:      Appearance: He is well-developed.  HENT:     Mouth/Throat:     Tonsils: No tonsillar exudate or tonsillar abscesses.  Cardiovascular:     Rate and Rhythm: Normal rate and regular rhythm.  Pulmonary:     Effort: Pulmonary effort is normal.     Breath sounds: Normal breath sounds.  Neurological:     Mental Status: He is alert.  Psychiatric:        Mood and Affect: Mood normal.      IMPRESSION AND PLAN: Non-toxic, stable, appearing male who presents today for upper respiratory infection symptoms.  Residual postviral inflammation suspected, no sign of active infection. X-ray at this time is not warranted due to normal lung ausculation and lack of severe URI symptoms.  Covid, influenzae, and strep test were ordered---all neg.  Patient prescribed Prednisone 20mg , 2 qd for 5 days for symptoms relief.  An After Visit Summary was printed and given to the patient.  FOLLOW UP:  Routine f/u  Phil Dopp - MS3

## 2021-03-21 NOTE — Progress Notes (Signed)
See student note for this encounter. °I have attested it. °Signed:  Phil Mazie Fencl, MD           03/21/2021 ° °

## 2021-03-22 ENCOUNTER — Other Ambulatory Visit: Payer: Self-pay | Admitting: Family Medicine

## 2021-03-22 DIAGNOSIS — E139 Other specified diabetes mellitus without complications: Secondary | ICD-10-CM

## 2021-04-12 DIAGNOSIS — E109 Type 1 diabetes mellitus without complications: Secondary | ICD-10-CM | POA: Diagnosis not present

## 2021-04-12 DIAGNOSIS — E871 Hypo-osmolality and hyponatremia: Secondary | ICD-10-CM | POA: Diagnosis not present

## 2021-04-12 DIAGNOSIS — E039 Hypothyroidism, unspecified: Secondary | ICD-10-CM | POA: Diagnosis not present

## 2021-04-25 ENCOUNTER — Other Ambulatory Visit: Payer: Self-pay | Admitting: Family Medicine

## 2021-06-08 ENCOUNTER — Encounter: Payer: Self-pay | Admitting: Family Medicine

## 2021-06-08 ENCOUNTER — Ambulatory Visit: Payer: BC Managed Care – PPO | Admitting: Family Medicine

## 2021-06-08 VITALS — BP 118/71 | HR 71 | Temp 98.0°F | Ht 64.0 in | Wt 156.0 lb

## 2021-06-08 DIAGNOSIS — M19042 Primary osteoarthritis, left hand: Secondary | ICD-10-CM

## 2021-06-08 DIAGNOSIS — M79642 Pain in left hand: Secondary | ICD-10-CM | POA: Diagnosis not present

## 2021-06-08 DIAGNOSIS — M79641 Pain in right hand: Secondary | ICD-10-CM

## 2021-06-08 DIAGNOSIS — F17200 Nicotine dependence, unspecified, uncomplicated: Secondary | ICD-10-CM

## 2021-06-08 DIAGNOSIS — M19041 Primary osteoarthritis, right hand: Secondary | ICD-10-CM

## 2021-06-08 DIAGNOSIS — J209 Acute bronchitis, unspecified: Secondary | ICD-10-CM

## 2021-06-08 DIAGNOSIS — M180 Bilateral primary osteoarthritis of first carpometacarpal joints: Secondary | ICD-10-CM

## 2021-06-08 DIAGNOSIS — G8929 Other chronic pain: Secondary | ICD-10-CM

## 2021-06-08 MED ORDER — DOXYCYCLINE HYCLATE 100 MG PO CAPS: 100.0000 mg | ORAL_CAPSULE | Freq: Two times a day (BID) | ORAL | 0 refills | Status: DC

## 2021-06-08 MED ORDER — PREDNISONE 20 MG PO TABS: ORAL_TABLET | ORAL | 0 refills | Status: DC

## 2021-06-08 NOTE — Progress Notes (Signed)
OFFICE VISIT ? ?06/08/2021 ? ?CC:  ?Chief Complaint  ?Patient presents with  ? Cough  ?  Head congestion, productive cough, phlegm is greenish-cream color. Has had cough for 1 month. Has used flonase rx prescribed  ? Rash  ?  Itches; spots mainly on arms and 1 on neck that wife noticed. Uses neosporin for spots if bleeding occurs.  ? ? ?Patient is a 56 y.o. male who presents for cough, rash. ? ?HPI: ? ?#1 nasal congestion/runny nose/postnasal drip, some frontal sinus headache, significant productive cough--all going on for at least 3 weeks.  Not much improvement lately.  No shortness of breath, no wheezing, no chest pain.  No fever. ?Has not tried any over-the-counter medications. ? ?#2 itchy bumps mostly on the extensor surfaces of both arms. ?Very itchy spots.  He picks at them.  They resolve and become little white macules. ? ?#3 chronic bilateral hand/fingers pain. ?For many years he has had pain in the joints of his hands.  Sounds like mainly the MCPs and the base of the thumbs.  He has been working as a Air traffic controller many years. ?Hands feel very stiff in the morning.  Has to run warm water over them in the morning to get them to loosen up much.  Lately he feels a palpable nodularity in the left hand over fifth metacarpal head region.  No triggering. ?He says occasionally some of his joints in the hands will turn a bit pink and swell. ?Has never had any arthritis evaluation, no x-rays etc. ?No other joints bother him at all. 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 ? ? ?ROS as above, plus-->no malaise or wt loss, no dizziness, no HAs, no melena/hematochezia.  No polyuria or polydipsia.  No myalgias.  No focal weakness, paresthesias, or tremors.  No acute vision or hearing abnormalities.  No dysuria or unusual/new urinary urgency or frequency.  No recent changes in lower legs. ?No n/v/d or abd pain.  No palpitations.   ? ?Past  Medical History:  ?Diagnosis Date  ? Back pain   ? s/p back surgery  ? Chronic hyponatremia 08/2012 onset  ? Onset was right after starting amitriptyline for his neuropathic leg pain--has been stable.  Likely ADH-like effect on kidney from amitriptyline.  ? COVID-19 virus infection 09/03/2020  ? History of low back pain 06/24/2011  ? Hyperlipidemia   ? Hypothyroidism   ? 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 Dr. Steffanie Dunn.  ? Migraines   ? Neuropathic pain of right lower extremity   ? PLANTAR WART, RIGHT 10/09/2009  ? Snoring   ? Sleep study neg  OSA 04/2018  ? Tobacco dependence   ? quit 2012; restarted not long after  ? ? ?Past Surgical History:  ?Procedure Laterality Date  ? BACK SURGERY  02/12/2007  ? lower lumbar fusion- rubber disc in back  ? CARDIOVASCULAR STRESS TEST  03/21/2014  ? No ischemia/low risk study; however, LV function showed EF 49% and global LV hypokinesis--echo recommended and it showed normal LV fxn.  ? NASAL SINUS SURGERY  02/11/2005  ? Polysomnogram  04/2018  ? NO OSA (Dr. Rexene Alberts)  ? TRANSTHORACIC ECHOCARDIOGRAM  03/24/2014  ? Normal LV fxn, EF 50-55%, no wall motion abnormalities: (grade 1 diast dysfxn)  ? ? ?Outpatient Medications Prior to Visit  ?Medication Sig Dispense Refill  ? amitriptyline (ELAVIL) 25 MG tablet 3  tabs po qhs for legs pain 90 tablet 5  ? atorvastatin (LIPITOR) 20 MG tablet TAKE ONE TABLET BY MOUTH EVERY DAY 30 tablet 0  ? buPROPion (WELLBUTRIN XL) 150 MG 24 hr tablet Take 1 tablet (150 mg total) by mouth daily. 30 tablet 0  ? citalopram (CELEXA) 40 MG tablet Take 1 tablet (40 mg total) by mouth daily. 90 tablet 3  ? Continuous Blood Gluc Sensor (FREESTYLE LIBRE 2 SENSOR) MISC APPLY ROUTE EVERY 14 DAYS. 3 each 6  ? fluticasone (FLONASE) 50 MCG/ACT nasal spray Place 2 sprays into both nostrils daily. 16 g 3  ? insulin aspart (NOVOLOG FLEXPEN) 100 UNIT/ML FlexPen 15 units qAC 15 mL 11  ? insulin glargine-yfgn (SEMGLEE, YFGN,) 100  UNIT/ML injection 20 U SQ qd 10 mL 11  ? Insulin Pen Needle (SURE COMFORT PEN NEEDLES) 32G X 4 MM MISC USE FOUR TIMES DAILY AS NEEDED 100 each 10  ? levothyroxine (SYNTHROID) 137 MCG tablet TAKE ONE TABLET BY MOUTH EVERY DAY BEFORE BREAKFAST 30 tablet 2  ? losartan (COZAAR) 100 MG tablet Take 1 tablet (100 mg total) by mouth daily. 90 tablet 3  ? meloxicam (MOBIC) 15 MG tablet Take 1 tablet (15 mg total) by mouth daily. 30 tablet 5  ? pantoprazole (PROTONIX) 40 MG tablet Take 1 tablet (40 mg total) by mouth daily. 90 tablet 3  ? sildenafil (VIAGRA) 100 MG tablet Take 1 tablet (100 mg total) by mouth daily as needed for erectile dysfunction. 10 tablet 11  ? spironolactone (ALDACTONE) 50 MG tablet take 1 1/2 tablets by mouth daily 45 tablet 1  ? tamsulosin (FLOMAX) 0.4 MG CAPS capsule Take 1 capsule (0.4 mg total) by mouth daily after supper. 90 capsule 3  ? predniSONE (DELTASONE) 20 MG tablet 2 tabs po qd x 5d 10 tablet 0  ? ?No facility-administered medications prior to visit.  ? ? ?Allergies  ?Allergen Reactions  ? Amlodipine Other (See Comments)  ?  Headache  ? Augmentin [Amoxicillin-Pot Clavulanate] Nausea And Vomiting  ? Oxycodone-Acetaminophen   ?  REACTION: itching  ? Levothyroxine Rash  ?  Had rash associated with generic levothyroxine, but not brand-name Synthroid.  Likely allergic to a dye or filler in the generic preparation.  ? Lisinopril Cough  ? ? ?ROS ?As per HPI ? ?PE: ? ?  06/08/2021  ?  2:11 PM 03/19/2021  ?  3:10 PM 01/01/2021  ?  2:46 PM  ?Vitals with BMI  ?Height 5\' 4"  5\' 4"    ?Weight 156 lbs 158 lbs 6 oz   ?BMI 26.76 27.18   ?Systolic [id number]  ?Diastolic 71 72 72  ?Pulse 71 82 80  ? ?Physical Exam ? ?VS: noted--normal. ?Gen: alert, NAD, NONTOXIC APPEARING. ?HEENT: eyes without injection, drainage, or swelling.  Ears: EACs clear, TMs with normal light reflex and landmarks.  Nose: Clear rhinorrhea, with some dried, crusty exudate adherent to mildly injected mucosa.  No purulent d/c.  No paranasal  sinus TTP.  No facial swelling.  Throat and mouth without focal lesion.  No pharyngial swelling, erythema, or exudate.   ?Neck: supple, no LAD.   ?LUNGS: CTA bilat, nonlabored resps.   ?CV: RRR, no m/r/g. ?EXT: no c/c/e ?SKIN: no rash ?Hands: Marked hypertrophy deformity of of the CMC joints bilaterally, mild hypertrophy deformity of MCPs, PIPs, and DIPs.  No significant varus or valgus deformity of fingers.  No tophi.  He has tenderness to palpation over mainly the CMC's and MCPs. ?No warmth  or joint swelling.  Active range of motion intact but stiffness noted. ?Small firm nodule palpable on palmar area, distal aspect of fifth metacarpal left hand.  No triggering. ?Positive squeeze. ? ?LABS:  ?Last CBC ?Lab Results  ?Component Value Date  ? WBC 10.1 12/01/2020  ? HGB 14.1 12/01/2020  ? HCT 41.5 12/01/2020  ? MCV 92.6 12/01/2020  ? MCH 31.5 12/01/2020  ? RDW 13.4 12/01/2020  ? PLT 297 12/01/2020  ? ?Last metabolic panel ?Lab Results  ?Component Value Date  ? GLUCOSE 88 01/12/2021  ? NA 122 (L) 01/12/2021  ? K 5.2 No hemolysis seen (H) 01/12/2021  ? CL 87 (L) 01/12/2021  ? CO2 28 01/12/2021  ? BUN 23 01/12/2021  ? CREATININE 1.06 01/12/2021  ? GFRNONAA >60 09/15/2018  ? CALCIUM 8.7 01/12/2021  ? PROT 6.3 12/01/2020  ? ALBUMIN 4.3 05/03/2020  ? BILITOT 0.6 12/01/2020  ? ALKPHOS 129 (H) 05/03/2020  ? AST 20 12/01/2020  ? ALT 19 12/01/2020  ? ANIONGAP 10 09/15/2018  ? ?Last hemoglobin A1c ?Lab Results  ?Component Value Date  ? HGBA1C 7.1 (A) 11/03/2020  ? HGBA1C 7.1 11/03/2020  ? HGBA1C 7.1 (A) 11/03/2020  ? HGBA1C 7.1 (A) 11/03/2020  ? ?Last thyroid functions ?Lab Results  ?Component Value Date  ? TSH 1.84 01/12/2021  ? T3TOTAL 91 01/12/2021  ? ?IMPRESSION AND PLAN: ? ?#1 prolonged URI with acute bronchitis.  Complicated by tobacco use. ?Prednisone 40 mg a day x5 days, then 20 mg a day x5 days. ?Doxycycline 100 mg twice daily x10 days. ?He knows he may have to adjust his insulin to account for any hyperglycemia that  prednisone may cause. ? ?#2 tobacco dependence. ?He does want to quit. ?Chantix caused bad dreams. ?Wellbutrin that he currently takes has not helped any with smoking cessation. ?He is agreeable to a trial of

## 2021-06-12 ENCOUNTER — Ambulatory Visit (HOSPITAL_BASED_OUTPATIENT_CLINIC_OR_DEPARTMENT_OTHER)
Admission: RE | Admit: 2021-06-12 | Discharge: 2021-06-12 | Disposition: A | Discharge status: Home / Self Care | Payer: BC Managed Care – PPO | Source: Ambulatory Visit | Attending: Family Medicine | Admitting: Family Medicine

## 2021-06-12 DIAGNOSIS — M79642 Pain in left hand: Secondary | ICD-10-CM | POA: Insufficient documentation

## 2021-06-12 DIAGNOSIS — M79641 Pain in right hand: Secondary | ICD-10-CM | POA: Diagnosis not present

## 2021-06-12 DIAGNOSIS — G8929 Other chronic pain: Secondary | ICD-10-CM | POA: Diagnosis not present

## 2021-06-12 DIAGNOSIS — M19041 Primary osteoarthritis, right hand: Secondary | ICD-10-CM | POA: Diagnosis not present

## 2021-06-12 DIAGNOSIS — M19042 Primary osteoarthritis, left hand: Secondary | ICD-10-CM | POA: Diagnosis not present

## 2021-06-15 ENCOUNTER — Ambulatory Visit: Payer: BC Managed Care – PPO | Admitting: Family Medicine

## 2021-06-15 ENCOUNTER — Encounter: Payer: Self-pay | Admitting: Family Medicine

## 2021-06-15 VITALS — BP 117/71 | HR 82 | Temp 98.1°F | Ht 64.0 in | Wt 156.4 lb

## 2021-06-15 DIAGNOSIS — M19042 Primary osteoarthritis, left hand: Secondary | ICD-10-CM

## 2021-06-15 DIAGNOSIS — M19041 Primary osteoarthritis, right hand: Secondary | ICD-10-CM

## 2021-06-15 DIAGNOSIS — M19031 Primary osteoarthritis, right wrist: Secondary | ICD-10-CM | POA: Diagnosis not present

## 2021-06-15 DIAGNOSIS — M180 Bilateral primary osteoarthritis of first carpometacarpal joints: Secondary | ICD-10-CM

## 2021-06-15 DIAGNOSIS — M19032 Primary osteoarthritis, left wrist: Secondary | ICD-10-CM

## 2021-06-15 MED ORDER — HYDROCODONE-ACETAMINOPHEN 5-325 MG PO TABS: ORAL_TABLET | ORAL | 0 refills | Status: DC

## 2021-06-15 MED ORDER — TRIAMCINOLONE ACETONIDE 40 MG/ML IJ SUSP
20.0000 mg | Freq: Once | INTRAMUSCULAR | Status: AC
  Administered 2021-06-15: 20 mg via INTRAMUSCULAR

## 2021-06-15 NOTE — Progress Notes (Signed)
OFFICE VISIT ? ?06/15/2021 ? ?CC:  ?Chief Complaint  ?Patient presents with  ? Hands  ?  Follow up; pt states hands feel about the same.   ? ?Patient is a 56 y.o. male who presents for 1 week follow-up osteoarthritis both hands. ?A/P as of last visit: ?"#1 prolonged URI with acute bronchitis.  Complicated by tobacco use. ?Prednisone 40 mg a day x5 days, then 20 mg a day x5 days. ?Doxycycline 100 mg twice daily x10 days. ?He knows he may have to adjust his insulin to account for any hyperglycemia that prednisone may cause. ?  ?#2 tobacco dependence. ?He does want to quit. ?Chantix caused bad dreams. ?Wellbutrin that he currently takes has not helped any with smoking cessation. ?He is agreeable to a trial of nicotine replacement--- prescription given for the 21 mg nicotine patch today. ?  ?#3 bilateral hand arthritis.  Suspect diffuse osteoarthritic joints. ?Obtain x-rays of both hands. ?He can continue meloxicam 15 mg every day. ?Considering referral to orthopedics. ?Discussed the option of trial of ultrasound-guided steroid injection into CMC joints as well as around the ganglion cyst in left hand fifth metacarpal head region. ?He will make return appointment with me for this procedure. ?  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  No joint effusion or synovitis detected in the thumb or index finger of left hand.  Left small finger flexor tendon appeared normal but there did appear to be a focal, fairly well demarcated anechoic area adjacent to and extending just a bit superficial to the flexor tendon---> 0.5 cm length, 0.5 cm depth, 1.5 cm width.  This area was minimally compressible.  Doppler negative. ?No other structures were evaluated by ultrasound today.)" ?INTERIM HX: ?Patient's respiratory symptoms have resolved. ? ?We reviewed his hand x-rays done 06/12/2021. ?These showed significant osteoarthritis, greatest within the thumb  carpometacarpal, triscaphe,and third metacarpophalangeal joints of both hands. ? ?Again, he describes severe constant pain in the wrists and hands that has gradually progressed over the years, lots of stiffness.  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 ?He takes meloxicam daily and this used to help some but lately he has started supplementing with Tylenol as well as sometimes Goody's powder and ibuprofen. ? ? ?Past Medical History:  ?Diagnosis Date  ? Back pain   ? s/p back surgery  ? Chronic hyponatremia 08/2012 onset  ? Onset was right after starting amitriptyline for his neuropathic leg pain--has been stable.  Likely ADH-like effect on kidney from amitriptyline.  ? COVID-19 virus infection 09/03/2020  ? History of low back pain 06/24/2011  ? Hyperlipidemia   ? Hypothyroidism   ? 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 Dr. Steffanie Dunn.  ? Migraines   ? Neuropathic pain of right lower extremity   ? PLANTAR WART, RIGHT 10/09/2009  ? Snoring   ? Sleep study neg  OSA 04/2018  ? Tobacco dependence   ? quit 2012; restarted not long after  ? ? ?Past Surgical History:  ?Procedure Laterality Date  ? BACK SURGERY  02/12/2007  ? lower lumbar fusion- rubber disc in back  ? CARDIOVASCULAR STRESS TEST  03/21/2014  ? No ischemia/low risk study; however, LV function showed EF 49% and global LV hypokinesis--echo recommended and it showed normal LV fxn.  ? NASAL SINUS SURGERY  02/11/2005  ? Polysomnogram  04/2018  ? NO OSA (Dr.  Athar)  ? TRANSTHORACIC ECHOCARDIOGRAM  03/24/2014  ? Normal LV fxn, EF 50-55%, no wall motion abnormalities: (grade 1 diast dysfxn)  ? ? ?Outpatient Medications Prior to Visit  ?Medication Sig Dispense Refill  ? amitriptyline (ELAVIL) 25 MG tablet 3 tabs po qhs for legs pain 90 tablet 5  ? atorvastatin (LIPITOR) 20 MG tablet TAKE ONE TABLET BY MOUTH EVERY DAY 30 tablet 0  ? buPROPion (WELLBUTRIN XL) 150 MG 24 hr tablet Take 1 tablet (150 mg total)  by mouth daily. 30 tablet 0  ? citalopram (CELEXA) 40 MG tablet Take 1 tablet (40 mg total) by mouth daily. 90 tablet 3  ? Continuous Blood Gluc Sensor (FREESTYLE LIBRE 2 SENSOR) MISC APPLY ROUTE EVERY 14 DAYS. 3 each 6  ? fluticasone (FLONASE) 50 MCG/ACT nasal spray Place 2 sprays into both nostrils daily. 16 g 3  ? insulin aspart (NOVOLOG FLEXPEN) 100 UNIT/ML FlexPen 15 units qAC 15 mL 11  ? insulin glargine-yfgn (SEMGLEE, YFGN,) 100 UNIT/ML injection 20 U SQ qd 10 mL 11  ? Insulin Pen Needle (SURE COMFORT PEN NEEDLES) 32G X 4 MM MISC USE FOUR TIMES DAILY AS NEEDED 100 each 10  ? levothyroxine (SYNTHROID) 137 MCG tablet TAKE ONE TABLET BY MOUTH EVERY DAY BEFORE BREAKFAST 30 tablet 2  ? losartan (COZAAR) 100 MG tablet Take 1 tablet (100 mg total) by mouth daily. 90 tablet 3  ? meloxicam (MOBIC) 15 MG tablet Take 1 tablet (15 mg total) by mouth daily. 30 tablet 5  ? pantoprazole (PROTONIX) 40 MG tablet Take 1 tablet (40 mg total) by mouth daily. 90 tablet 3  ? predniSONE (DELTASONE) 20 MG tablet 2 tabs po qd x 5d then 1 tab po qd x 5d 15 tablet 0  ? sildenafil (VIAGRA) 100 MG tablet Take 1 tablet (100 mg total) by mouth daily as needed for erectile dysfunction. 10 tablet 11  ? tamsulosin (FLOMAX) 0.4 MG CAPS capsule Take 1 capsule (0.4 mg total) by mouth daily after supper. 90 capsule 3  ? doxycycline (VIBRAMYCIN) 100 MG capsule Take 1 capsule (100 mg total) by mouth 2 (two) times daily for 10 days. (Patient not taking: Reported on 06/15/2021) 20 capsule 0  ? ?No facility-administered medications prior to visit.  ? ? ?Allergies  ?Allergen Reactions  ? Amlodipine Other (See Comments)  ?  Headache  ? Augmentin [Amoxicillin-Pot Clavulanate] Nausea And Vomiting  ? Oxycodone-Acetaminophen   ?  REACTION: itching  ? Levothyroxine Rash  ?  Had rash associated with generic levothyroxine, but not brand-name Synthroid.  Likely allergic to a dye or filler in the generic preparation.  ? Lisinopril Cough  ? ? ?ROS ?As per  HPI ? ?PE: ? ?  06/15/2021  ?  3:37 PM 06/08/2021  ?  2:11 PM 03/19/2021  ?  3:10 PM  ?Vitals with BMI  ?Height 5\' 4"  5\' 4"  5\' 4"   ?Weight 156 lbs 6 oz 156 lbs 158 lbs 6 oz  ?BMI 26.83 26.76 27.18  ?Systolic [id number] [id number]  ?Diastolic 71 71 72  ?Pulse 82 71 82  ? ? ? ?Physical Exam ? ?General: Alert and well-appearing. ?Hands with diffuse bony hypertrophy about all the joints, worse at the base base of the thumbs. ?Mild tenderness to palpation diffusely, with more prominent tenderness over the carpometacarpal joints of the thumbs.  No joint erythema or soft tissue swelling.   ?Wrist and fingers range of motion is intact but very stiff and painful. ? ?LABS:  ?Last CBC ?  Lab Results  ?Component Value Date  ? WBC 10.1 12/01/2020  ? HGB 14.1 12/01/2020  ? HCT 41.5 12/01/2020  ? MCV 92.6 12/01/2020  ? MCH 31.5 12/01/2020  ? RDW 13.4 12/01/2020  ? PLT 297 12/01/2020  ? ?Last metabolic panel ?Lab Results  ?Component Value Date  ? GLUCOSE 88 01/12/2021  ? NA 122 (L) 01/12/2021  ? K 5.2 No hemolysis seen (H) 01/12/2021  ? CL 87 (L) 01/12/2021  ? CO2 28 01/12/2021  ? BUN 23 01/12/2021  ? CREATININE 1.06 01/12/2021  ? GFRNONAA >60 09/15/2018  ? CALCIUM 8.7 01/12/2021  ? PROT 6.3 12/01/2020  ? ALBUMIN 4.3 05/03/2020  ? BILITOT 0.6 12/01/2020  ? ALKPHOS 129 (H) 05/03/2020  ? AST 20 12/01/2020  ? ALT 19 12/01/2020  ? ANIONGAP 10 09/15/2018  ? ?IMPRESSION AND PLAN: ? ?Severe bilateral hand osteoarthritis, most significantly affecting the triscaphi, thumb carpometacarpal, and third MCP on each hand.  ?NSAIDs no longer helpful. ?Referral to hand specialist ordered today. ?Discussed potential benefits and risks of intra-articular steroid injection. ?Using shared decision making process we decided to proceed with trial of therapeutic injection into the left carpometacarpal joint today. ?Vicodin 5/325, 1-2 every 6 hours as needed, #30. ? ?Ultrasound-guided injection is preferred based on studies that show increased duration, increased effect,  greater accuracy, decreased procedural pain, increased response rate, and decreased cost with ultrasound-guided versus blind injection. ?Procedure: Real-time ultrasound guided injection of left thumb carpometacarpal

## 2021-06-19 ENCOUNTER — Other Ambulatory Visit: Payer: Self-pay | Admitting: Family Medicine

## 2021-06-27 ENCOUNTER — Other Ambulatory Visit: Payer: Self-pay | Admitting: Family Medicine

## 2021-06-27 ENCOUNTER — Encounter: Payer: Self-pay | Admitting: Family Medicine

## 2021-06-28 NOTE — Telephone Encounter (Signed)
Pt was given #30 on 5/5 for Norco.  Please review and advise

## 2021-06-29 MED ORDER — HYDROCODONE-ACETAMINOPHEN 5-325 MG PO TABS: ORAL_TABLET | ORAL | 0 refills | Status: DC

## 2021-06-29 NOTE — Telephone Encounter (Signed)
This medication was refilled by provider today, [phone])

## 2021-06-29 NOTE — Telephone Encounter (Signed)
Sorry the shots did not help. Norco sent.

## 2021-07-10 IMAGING — DX CHEST - 2 VIEW
2 series · 2 of 2 positions shown · non-contrast
Comparison: 03/20/2017.

CLINICAL DATA: Shortness of breath.

EXAM:
CHEST - 2 VIEW

[chest pa]
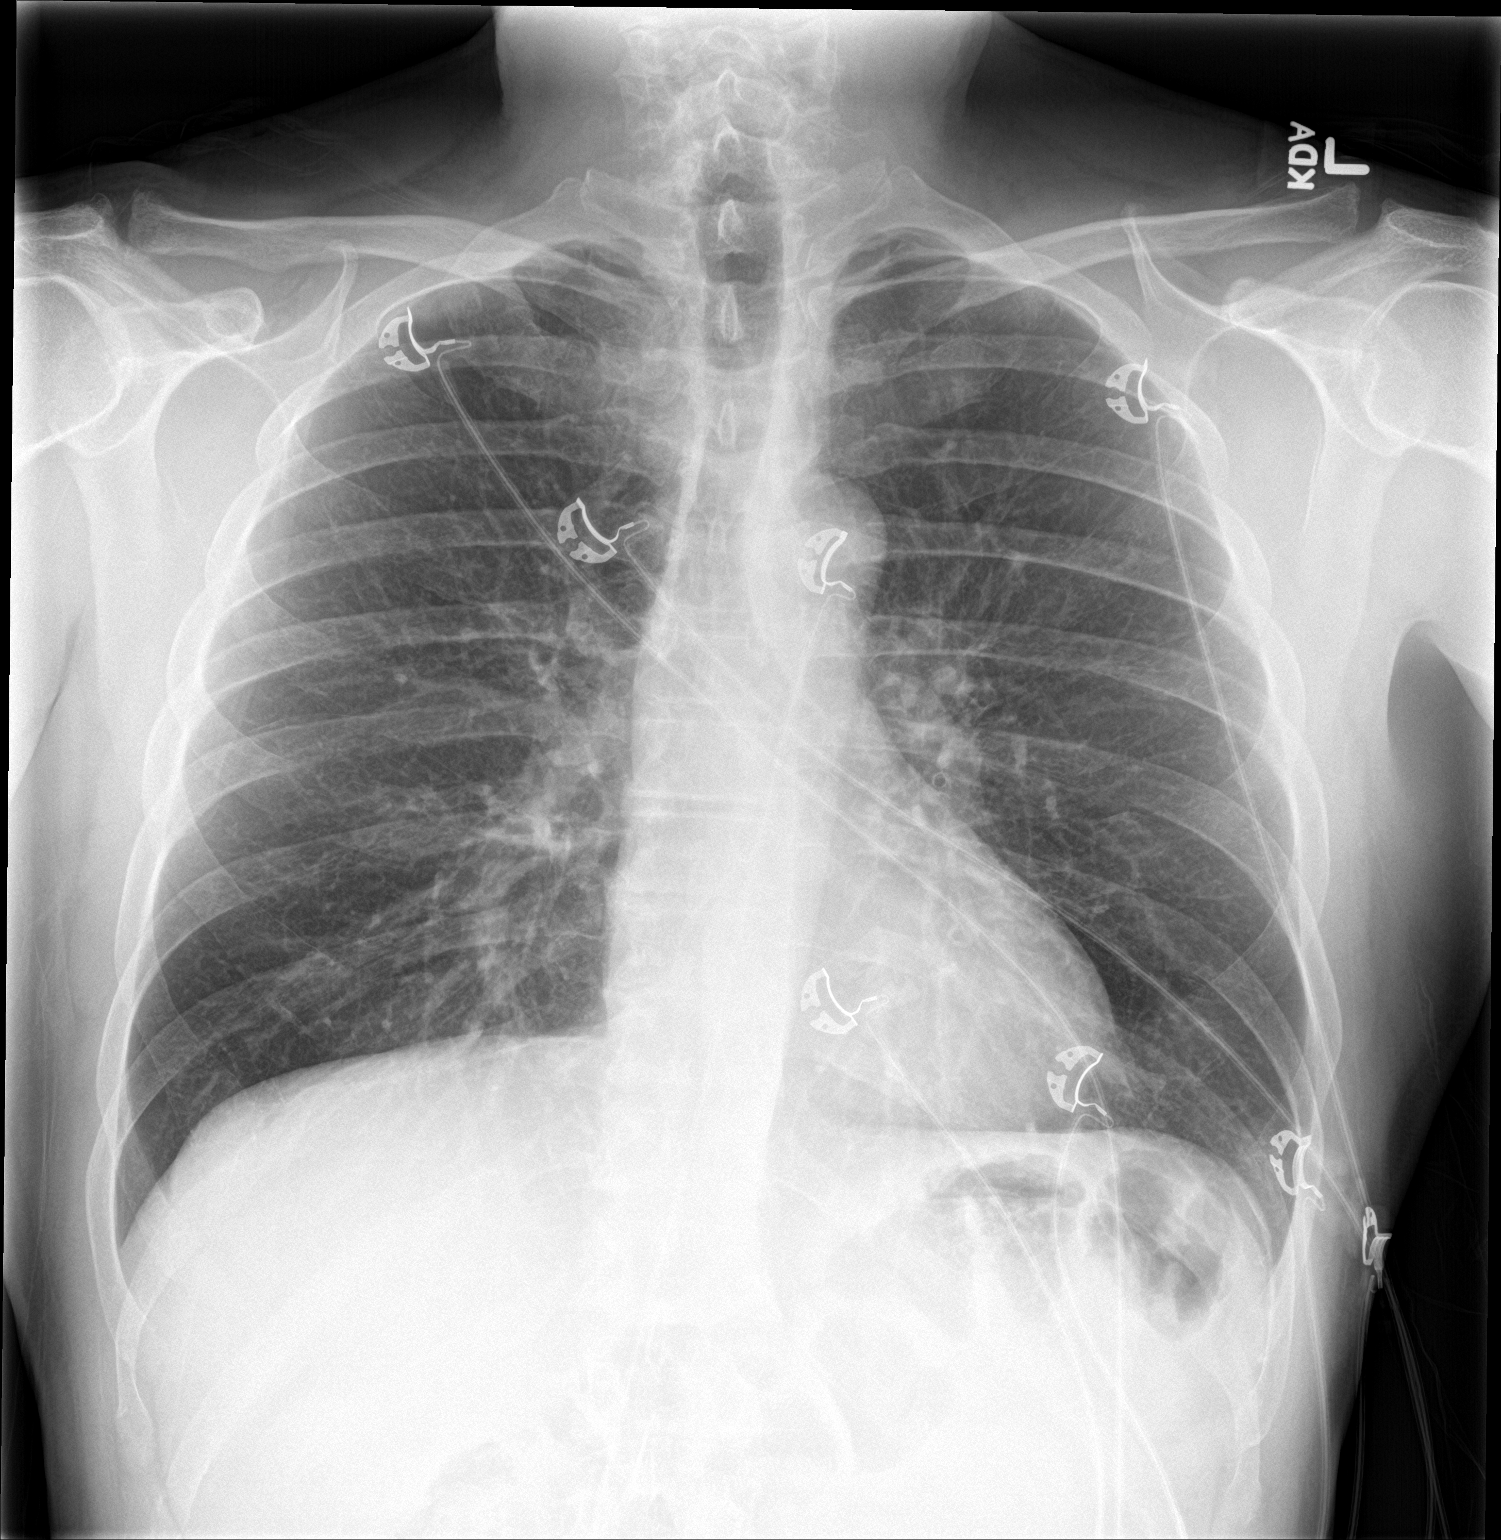

[chest lat]
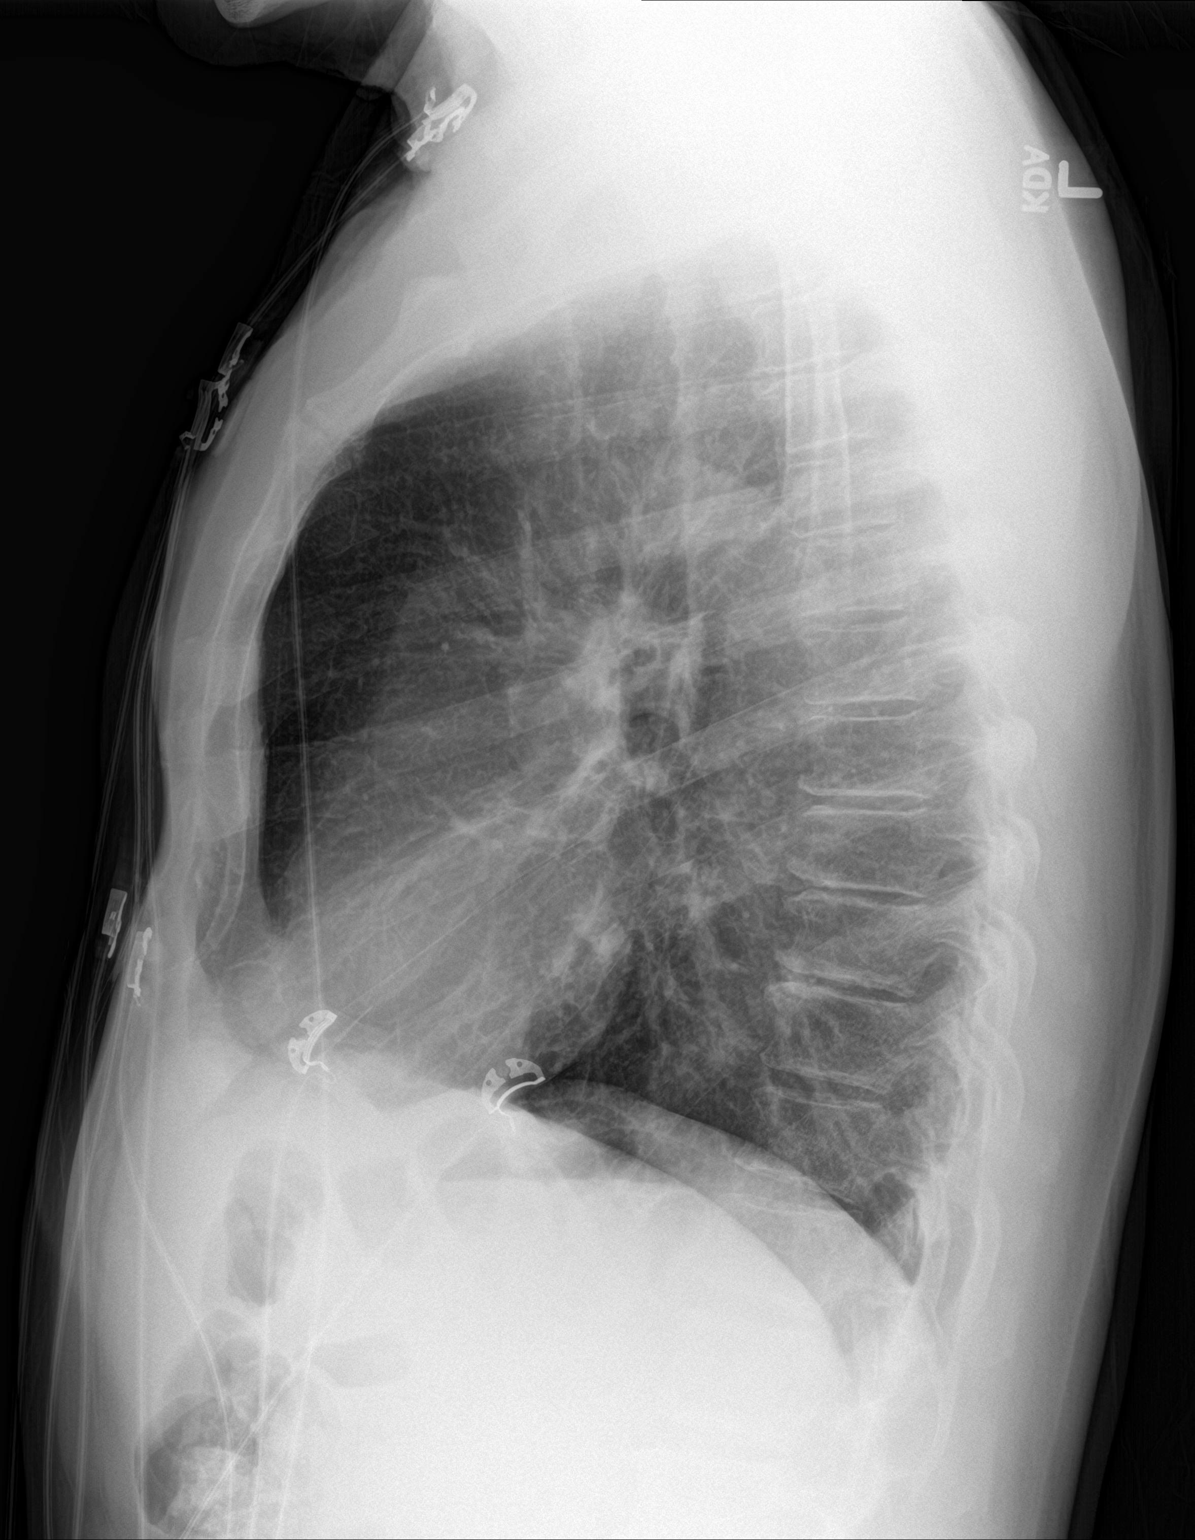

[2 of 2 positions shown; findings below may reference images not displayed]

FINDINGS: Mediastinum hilar structures normal. Lungs are clear. No pleural
effusion or pneumothorax. Heart size normal. No acute bony
abnormality. Degenerative change thoracic spine.
IMPRESSION: Disease.

## 2021-07-12 DIAGNOSIS — M1812 Unilateral primary osteoarthritis of first carpometacarpal joint, left hand: Secondary | ICD-10-CM | POA: Diagnosis not present

## 2021-07-12 DIAGNOSIS — G5601 Carpal tunnel syndrome, right upper limb: Secondary | ICD-10-CM | POA: Diagnosis not present

## 2021-07-12 DIAGNOSIS — G5602 Carpal tunnel syndrome, left upper limb: Secondary | ICD-10-CM | POA: Diagnosis not present

## 2021-07-12 DIAGNOSIS — M1811 Unilateral primary osteoarthritis of first carpometacarpal joint, right hand: Secondary | ICD-10-CM | POA: Diagnosis not present

## 2021-07-12 DIAGNOSIS — G5603 Carpal tunnel syndrome, bilateral upper limbs: Secondary | ICD-10-CM | POA: Diagnosis not present

## 2021-07-17 ENCOUNTER — Encounter: Payer: Self-pay | Admitting: Family Medicine

## 2021-07-17 ENCOUNTER — Other Ambulatory Visit: Payer: Self-pay | Admitting: Family Medicine

## 2021-07-18 MED ORDER — HYDROCODONE-ACETAMINOPHEN 5-325 MG PO TABS: ORAL_TABLET | ORAL | 0 refills | Status: DC

## 2021-07-18 NOTE — Telephone Encounter (Signed)
Requesting: hydrocodone Contract: N/A UDS: N/A Last Visit: 06/15/21 Next Visit: N/A Last Refill: 06/29/21(45,0)  Please Advise. Med pending

## 2021-07-18 NOTE — Telephone Encounter (Signed)
Prescription sent. Please request records from Dr. Carlos Levering office

## 2021-07-18 NOTE — Telephone Encounter (Signed)
Duplicate med request. One has already been sent to provider for review based on pt advice request

## 2021-07-20 NOTE — Telephone Encounter (Signed)
Most recent office visit requested

## 2021-08-02 ENCOUNTER — Encounter: Payer: Self-pay | Admitting: Family Medicine

## 2021-08-02 ENCOUNTER — Ambulatory Visit: Payer: BC Managed Care – PPO | Admitting: Family Medicine

## 2021-08-02 ENCOUNTER — Other Ambulatory Visit: Payer: Self-pay | Admitting: Family Medicine

## 2021-08-02 VITALS — BP 137/75 | HR 70 | Temp 97.8°F | Ht 64.0 in | Wt 154.6 lb

## 2021-08-02 DIAGNOSIS — E139 Other specified diabetes mellitus without complications: Secondary | ICD-10-CM | POA: Diagnosis not present

## 2021-08-02 DIAGNOSIS — E871 Hypo-osmolality and hyponatremia: Secondary | ICD-10-CM

## 2021-08-02 DIAGNOSIS — E039 Hypothyroidism, unspecified: Secondary | ICD-10-CM

## 2021-08-02 DIAGNOSIS — G894 Chronic pain syndrome: Secondary | ICD-10-CM | POA: Diagnosis not present

## 2021-08-02 LAB — COMPREHENSIVE METABOLIC PANEL
ALT: 12 U/L (ref 0–53)
AST: 14 U/L (ref 0–37)
Albumin: 4 g/dL (ref 3.5–5.2)
Alkaline Phosphatase: 118 U/L — ABNORMAL HIGH (ref 39–117)
BUN: 14 mg/dL (ref 6–23)
CO2: 32 mEq/L (ref 19–32)
Calcium: 9.2 mg/dL (ref 8.4–10.5)
Chloride: 91 mEq/L — ABNORMAL LOW (ref 96–112)
Creatinine, Ser: 1.16 mg/dL (ref 0.40–1.50)
GFR: 70.39 mL/min (ref >60.00)
Glucose, Bld: 165 mg/dL — ABNORMAL HIGH (ref 70–99)
Potassium: 4 mEq/L (ref 3.5–5.1)
Sodium: 127 mEq/L — ABNORMAL LOW (ref 135–145)
Total Bilirubin: 0.5 mg/dL (ref 0.2–1.2)
Total Protein: 6.5 g/dL (ref 6.0–8.3)

## 2021-08-02 LAB — CBC
HCT: 41.8 % (ref 39.0–52.0)
Hemoglobin: 14.2 g/dL (ref 13.0–17.0)
MCHC: 34 g/dL (ref 30.0–36.0)
MCV: 93.6 fl (ref 78.0–100.0)
Platelets: 270 10*3/uL (ref 150.0–400.0)
RBC: 4.46 Mil/uL (ref 4.22–5.81)
RDW: 14.1 % (ref 11.5–15.5)
WBC: 9.7 10*3/uL (ref 4.0–10.5)

## 2021-08-02 LAB — HEMOGLOBIN A1C: Hgb A1c MFr Bld: 7.2 % — ABNORMAL HIGH (ref 4.6–6.5)

## 2021-08-02 LAB — TSH: TSH: 24.47 u[IU]/mL — ABNORMAL HIGH (ref 0.35–5.50)

## 2021-08-02 MED ORDER — HYDROCODONE-ACETAMINOPHEN 5-325 MG PO TABS: ORAL_TABLET | ORAL | 0 refills | Status: DC

## 2021-08-02 NOTE — Progress Notes (Signed)
OFFICE VISIT  08/02/2021  CC:  Chief Complaint  Patient presents with   Hand Pain    Scheduled for nerve test on 6/28 at [facility] office and next follow up is 7/3.   Patient is a 56 y.o. male who presents for 6 wk f/u bilat hands osteoarthritis pain.  INTERIM HX: Daniel Stevenson is in misery with this pain. He takes 2 of the 5/325 Vicodin every 4-6 hours just to make things manageable. He is unable to work, has some short-term disability paperwork for me today. He did see the hand surgeon since I saw him last and plan is for nerve conduction studies to be done 08/08/2021 and follow-up with orthopedics on 08/13/2021.  Plan for surgery was discussed but nothing set.  Denies side effects from pain medicines.  He has not followed up with his endocrinologist lately.  ROS as above, plus--> no fevers, no CP, no SOB, no wheezing, no cough, no dizziness, no HAs, no rashes, no melena/hematochezia.  No polyuria or polydipsia.  No myalgias No focal weakness, paresthesias, or tremors.  No acute vision or hearing abnormalities.  No dysuria or unusual/new urinary urgency or frequency.  No recent changes in lower legs. No n/v/d or abd pain.  No palpitations.     Past Medical History:  Diagnosis Date   Back pain    s/p back surgery   Chronic hyponatremia 08/2012 onset   Onset was right after starting amitriptyline for his neuropathic leg pain--has been stable.  Likely ADH-like effect on kidney from amitriptyline.   COVID-19 virus infection 09/03/2020   History of low back pain 06/24/2011   Hyperlipidemia    Hypothyroidism    LADA (latent autoimmune diabetes in adults), managed as type 1 (HCC) Dx'd 2014   Dr. Morrison Old.   Migraines    Neuropathic pain of right lower extremity    PLANTAR WART, RIGHT 10/09/2009   Snoring    Sleep study neg  OSA 04/2018   Tobacco dependence    quit 2012; restarted not long after    Past Surgical History:  Procedure Laterality Date   BACK SURGERY  02/12/2007   lower lumbar  fusion- rubber disc in back   CARDIOVASCULAR STRESS TEST  03/21/2014   No ischemia/low risk study; however, LV function showed EF 49% and global LV hypokinesis--echo recommended and it showed normal LV fxn.   NASAL SINUS SURGERY  02/11/2005   Polysomnogram  04/2018   NO OSA (Dr. Frances Furbish)   TRANSTHORACIC ECHOCARDIOGRAM  03/24/2014   Normal LV fxn, EF 50-55%, no wall motion abnormalities: (grade 1 diast dysfxn)    Outpatient Medications Prior to Visit  Medication Sig Dispense Refill   amitriptyline (ELAVIL) 25 MG tablet 3 tabs po qhs for legs pain 90 tablet 5   atorvastatin (LIPITOR) 20 MG tablet TAKE ONE TABLET BY MOUTH EVERY DAY 30 tablet 0   buPROPion (WELLBUTRIN XL) 150 MG 24 hr tablet Take 1 tablet (150 mg total) by mouth daily. 30 tablet 0   citalopram (CELEXA) 40 MG tablet Take 1 tablet (40 mg total) by mouth daily. 90 tablet 3   Continuous Blood Gluc Sensor (FREESTYLE LIBRE 2 SENSOR) MISC APPLY ROUTE EVERY 14 DAYS. 3 each 6   fluticasone (FLONASE) 50 MCG/ACT nasal spray Place 2 sprays into both nostrils daily. 16 g 3   insulin aspart (NOVOLOG FLEXPEN) 100 UNIT/ML FlexPen 15 units qAC 15 mL 11   insulin glargine-yfgn (SEMGLEE, YFGN,) 100 UNIT/ML injection 20 U SQ qd 10 mL 11  Insulin Pen Needle (SURE COMFORT PEN NEEDLES) 32G X 4 MM MISC USE FOUR TIMES DAILY AS NEEDED 100 each 10   levothyroxine (SYNTHROID) 137 MCG tablet TAKE ONE TABLET BY MOUTH EVERY DAY BEFORE BREAKFAST 30 tablet 2   losartan (COZAAR) 100 MG tablet Take 1 tablet (100 mg total) by mouth daily. 90 tablet 3   meloxicam (MOBIC) 15 MG tablet Take 1 tablet (15 mg total) by mouth daily. 30 tablet 5   pantoprazole (PROTONIX) 40 MG tablet Take 1 tablet (40 mg total) by mouth daily. 90 tablet 3   sildenafil (VIAGRA) 100 MG tablet Take 1 tablet (100 mg total) by mouth daily as needed for erectile dysfunction. 10 tablet 11   tamsulosin (FLOMAX) 0.4 MG CAPS capsule Take 1 capsule (0.4 mg total) by mouth daily after supper. 90  capsule 3   HYDROcodone-acetaminophen (NORCO/VICODIN) 5-325 MG tablet 1-2 tabs po q6h prn severe pain 60 tablet 0   predniSONE (DELTASONE) 20 MG tablet 2 tabs po qd x 5d then 1 tab po qd x 5d (Patient not taking: Reported on 08/02/2021) 15 tablet 0   No facility-administered medications prior to visit.    Allergies  Allergen Reactions   Amlodipine Other (See Comments)    Headache   Augmentin [Amoxicillin-Pot Clavulanate] Nausea And Vomiting   Oxycodone-Acetaminophen     REACTION: itching   Levothyroxine Rash    Had rash associated with generic levothyroxine, but not brand-name Synthroid.  Likely allergic to a dye or filler in the generic preparation.   Lisinopril Cough    ROS As per HPI  PE:    08/02/2021    1:00 PM 06/15/2021    3:37 PM 06/08/2021    2:11 PM  Vitals with BMI  Height 5\' 4"  5\' 4"  5\' 4"   Weight 154 lbs 10 oz 156 lbs 6 oz 156 lbs  BMI 26.52 [id number]  Systolic 0000000 [id number]  Diastolic 75 71 71  Pulse 70 82 71   Physical Exam  Gen: Alert, well appearing.  Patient is oriented to person, place, time, and situation. AFFECT: pleasant, lucid thought and speech. Hands: Mild hypertrophy of the MCPs, PCPs, and DIPs.  He has full range of motion of the wrists and fingers, albeit stiff and painful.  Grip strength4/5 bilat.   LABS:  Last CBC Lab Results  Component Value Date   WBC 10.1 12/01/2020   HGB 14.1 12/01/2020   HCT 41.5 12/01/2020   MCV 92.6 12/01/2020   MCH 31.5 12/01/2020   RDW 13.4 12/01/2020   PLT 297 0000000   Last metabolic panel Lab Results  Component Value Date   GLUCOSE 88 01/12/2021   NA 122 (L) 01/12/2021   K 5.2 No hemolysis seen (H) 01/12/2021   CL 87 (L) 01/12/2021   CO2 28 01/12/2021   BUN 23 01/12/2021   CREATININE 1.06 01/12/2021   GFRNONAA >60 09/15/2018   CALCIUM 8.7 01/12/2021   PROT 6.3 12/01/2020   ALBUMIN 4.3 05/03/2020   BILITOT 0.6 12/01/2020   ALKPHOS 129 (H) 05/03/2020   AST 20 12/01/2020   ALT 19 12/01/2020    ANIONGAP 10 09/15/2018   Last lipids Lab Results  Component Value Date   CHOL 107 05/03/2020   HDL 61.40 05/03/2020   LDLCALC 38 05/03/2020   TRIG 36.0 05/03/2020   CHOLHDL 2 05/03/2020   Last hemoglobin A1c Lab Results  Component Value Date   HGBA1C 7.1 (A) 11/03/2020   HGBA1C 7.1 11/03/2020   HGBA1C 7.1 (  A) 11/03/2020   HGBA1C 7.1 (A) 11/03/2020   Last thyroid functions Lab Results  Component Value Date   TSH 1.84 01/12/2021   T3TOTAL 91 01/12/2021   IMPRESSION AND PLAN:  #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 Followed by hand surgery now--nerve conduction studies scheduled 08/08/2021 and follow-up with hand surgeon set for 08/13/2021.  Patient reports there has been discussion of joint replacement surgery. He is unable to continue working because this requires constant work with his hands. Reviewed short-term disability paperwork with him today and will fill this out completely--disability date starting today. We will continue with Vicodin 5/325, 1-2 4 times daily as needed, #240. Controlled substance contract completed today.  #2 hypertension, well controlled on losartan 100 mg a day. Electrolytes and creatinine today.  #3 chronic hyponatremia.  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 Rechecking sodium today.  #4 LADA, managed as type I. He has reestablished with endocrinologist but has not followed up lately.  We will check hemoglobin A1c today.  #5 hypothyroidism. Levothyroxine 137 mcg daily. TSH today.  An After Visit Summary was printed and given to the patient.  FOLLOW UP: Return in about 4 weeks (around 08/30/2021) for f/u chronic pain.  Signed:  Santiago Bumpers, MD           08/02/2021

## 2021-08-03 ENCOUNTER — Encounter: Payer: Self-pay | Admitting: Family Medicine

## 2021-08-06 ENCOUNTER — Telehealth: Payer: Self-pay

## 2021-08-06 NOTE — Telephone Encounter (Signed)
Pt advised forms complete for short term disability. He will come by and pick up forms

## 2021-08-08 DIAGNOSIS — G5603 Carpal tunnel syndrome, bilateral upper limbs: Secondary | ICD-10-CM | POA: Diagnosis not present

## 2021-08-13 DIAGNOSIS — G5602 Carpal tunnel syndrome, left upper limb: Secondary | ICD-10-CM | POA: Insufficient documentation

## 2021-08-30 ENCOUNTER — Ambulatory Visit: Payer: BC Managed Care – PPO | Admitting: Family Medicine

## 2021-08-30 ENCOUNTER — Encounter: Payer: Self-pay | Admitting: Family Medicine

## 2021-08-30 VITALS — BP 127/79 | HR 95 | Temp 98.0°F | Ht 64.0 in | Wt 151.8 lb

## 2021-08-30 DIAGNOSIS — E039 Hypothyroidism, unspecified: Secondary | ICD-10-CM

## 2021-08-30 DIAGNOSIS — Z79899 Other long term (current) drug therapy: Secondary | ICD-10-CM | POA: Diagnosis not present

## 2021-08-30 DIAGNOSIS — M79642 Pain in left hand: Secondary | ICD-10-CM

## 2021-08-30 DIAGNOSIS — M79641 Pain in right hand: Secondary | ICD-10-CM | POA: Diagnosis not present

## 2021-08-30 DIAGNOSIS — M25532 Pain in left wrist: Secondary | ICD-10-CM

## 2021-08-30 DIAGNOSIS — F411 Generalized anxiety disorder: Secondary | ICD-10-CM

## 2021-08-30 DIAGNOSIS — G894 Chronic pain syndrome: Secondary | ICD-10-CM | POA: Diagnosis not present

## 2021-08-30 DIAGNOSIS — M25531 Pain in right wrist: Secondary | ICD-10-CM

## 2021-08-30 DIAGNOSIS — F3342 Major depressive disorder, recurrent, in full remission: Secondary | ICD-10-CM

## 2021-08-30 MED ORDER — AMITRIPTYLINE HCL 25 MG PO TABS: ORAL_TABLET | ORAL | 5 refills | Status: DC

## 2021-08-30 MED ORDER — OXYCODONE HCL 5 MG PO TABS
ORAL_TABLET | ORAL | 0 refills | Status: DC
Start: 2021-08-30 — End: 2021-09-11

## 2021-08-30 MED ORDER — OXYCODONE HCL 5 MG PO TABS: ORAL_TABLET | ORAL | 0 refills | Status: DC

## 2021-08-30 NOTE — Progress Notes (Signed)
OFFICE VISIT  08/30/2021  CC: f/u pain  Patient is a 56 y.o. male who presents for 1 month follow-up bilateral hand pain secondary to severe arthritis. 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 Followed by hand surgery now--nerve conduction studies scheduled 08/08/2021 and follow-up with hand surgeon set for 08/13/2021.  Patient reports there has been discussion of joint replacement surgery. He is unable to continue working because this requires constant work with his hands. Reviewed short-term disability paperwork with him today and will fill this out completely--disability date starting today. We will continue with Vicodin 5/325, 1-2 4 times daily as needed, #240. Controlled substance contract completed today.   #2 hypertension, well controlled on losartan 100 mg a day. Electrolytes and creatinine today.   #3 chronic hyponatremia.  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 Rechecking sodium today.   #4 LADA, managed as type I. He has reestablished with endocrinologist but has not followed up lately.  We will check hemoglobin A1c today.   #5 hypothyroidism. Levothyroxine 137 mcg daily. TSH today."  INTERIM HX: Doing fine, pain in hands and wrists is same but he is not having to use as many vicodin tabs b/c not working anymore. Uses 6 tabs/day total.  No adverse side effects. He is scheduled for left carpal tunnel release surgery tomorrow with his hand surgeon.  The plan is for him to get his right carpal tunnel release surgery about a month later.   Indication for chronic opioid: Severe bilateral wrist and hand osteoarthritis. Medication and dose: vicodin 5/325, 1-2 q6h prn # pills per month: #240 (initial) Last UDS date: n/a Opioid Treatment Agreement signed (Y/N): yes Opioid Treatment Agreement last reviewed with patient:   PMP AWARE reviewed today: most recent rx for Vicodin was filled 08/02/2021, #240, rx by me. No red flags.  TSH was  elevated last visit so I increased his levothyroxine from 137 mcg/day to 1 and 1/2 of the 137 mcg tabs on Monday, Wednesday, and Friday and continue 1 tab all other days.  ROS --> no fevers, no CP, no SOB, no wheezing, no cough, no dizziness, no HAs, no rashes, no melena/hematochezia.  No polyuria or polydipsia.  No myalgias.  No focal weakness, paresthesias, or tremors.  No acute vision or hearing abnormalities.  No dysuria or unusual/new urinary urgency or frequency.  No recent changes in lower legs. No n/v/d or abd pain.  No palpitations.     Past Medical History:  Diagnosis Date   Back pain    s/p back surgery   Carpal tunnel syndrome on both sides    Chronic hyponatremia 08/2012 onset   Onset was right after starting amitriptyline for his neuropathic leg pain--has been stable.  Likely ADH-like effect on kidney from amitriptyline.   COVID-19 virus infection 09/03/2020   History of low back pain 06/24/2011   Hyperlipidemia    Hypothyroidism    LADA (latent autoimmune diabetes in adults), managed as type 1 (HCC) Dx'd 2014   Dr. Morrison Old.   Migraines    Neuropathic pain of right lower extremity    Osteoarthritis of hands, bilateral    Osteoarthritis of wrists, bilateral    PLANTAR WART, RIGHT 10/09/2009   Snoring    Sleep study neg  OSA 04/2018   Tobacco dependence    quit 2012; restarted not long after    Past Surgical History:  Procedure Laterality Date   BACK SURGERY  02/12/2007   lower lumbar fusion- rubber disc in  back   CARDIOVASCULAR STRESS TEST  03/21/2014   No ischemia/low risk study; however, LV function showed EF 49% and global LV hypokinesis--echo recommended and it showed normal LV fxn.   NASAL SINUS SURGERY  02/11/2005   Polysomnogram  04/2018   NO OSA (Dr. Frances Furbish)   TRANSTHORACIC ECHOCARDIOGRAM  03/24/2014   Normal LV fxn, EF 50-55%, no wall motion abnormalities: (grade 1 diast dysfxn)    Outpatient Medications Prior to Visit  Medication Sig Dispense Refill    atorvastatin (LIPITOR) 20 MG tablet TAKE ONE TABLET BY MOUTH EVERY DAY 30 tablet 0   buPROPion (WELLBUTRIN XL) 150 MG 24 hr tablet Take 1 tablet (150 mg total) by mouth daily. 30 tablet 0   citalopram (CELEXA) 40 MG tablet Take 1 tablet (40 mg total) by mouth daily. 90 tablet 3   Continuous Blood Gluc Sensor (FREESTYLE LIBRE 2 SENSOR) MISC APPLY ROUTE EVERY 14 DAYS. 3 each 6   fluticasone (FLONASE) 50 MCG/ACT nasal spray Place 2 sprays into both nostrils daily. 16 g 3   insulin aspart (NOVOLOG FLEXPEN) 100 UNIT/ML FlexPen 15 units qAC 15 mL 11   insulin glargine-yfgn (SEMGLEE, YFGN,) 100 UNIT/ML injection 20 U SQ qd 10 mL 11   Insulin Pen Needle (SURE COMFORT PEN NEEDLES) 32G X 4 MM MISC USE FOUR TIMES DAILY AS NEEDED 100 each 10   levothyroxine (SYNTHROID) 137 MCG tablet TAKE ONE TABLET BY MOUTH EVERY DAY BEFORE BREAKFAST 30 tablet 2   losartan (COZAAR) 100 MG tablet Take 1 tablet (100 mg total) by mouth daily. 90 tablet 3   meloxicam (MOBIC) 15 MG tablet Take 1 tablet (15 mg total) by mouth daily. 30 tablet 5   pantoprazole (PROTONIX) 40 MG tablet Take 1 tablet (40 mg total) by mouth daily. 90 tablet 3   sildenafil (VIAGRA) 100 MG tablet Take 1 tablet (100 mg total) by mouth daily as needed for erectile dysfunction. 10 tablet 11   tamsulosin (FLOMAX) 0.4 MG CAPS capsule Take 1 capsule (0.4 mg total) by mouth daily after supper. 90 capsule 3   amitriptyline (ELAVIL) 25 MG tablet TAKE ONE TABLET BY MOUTH EVERY MORNING AND TAKE THREE TABLETS at bedtime FOR right IN LEG PAIN 120 tablet 5   HYDROcodone-acetaminophen (NORCO/VICODIN) 5-325 MG tablet 1-2 tabs po q6h prn severe pain 240 tablet 0   No facility-administered medications prior to visit.    Allergies  Allergen Reactions   Amlodipine Other (See Comments)    Headache   Augmentin [Amoxicillin-Pot Clavulanate] Nausea And Vomiting   Oxycodone-Acetaminophen     REACTION: itching   Levothyroxine Rash    Had rash associated with generic  levothyroxine, but not brand-name Synthroid.  Likely allergic to a dye or filler in the generic preparation.   Lisinopril Cough    ROS As per HPI  PE:    08/30/2021    1:03 PM 08/02/2021    1:00 PM 06/15/2021    3:37 PM  Vitals with BMI  Height 5\' 4"  5\' 4"  5\' 4"   Weight 151 lbs 13 oz 154 lbs 10 oz 156 lbs 6 oz  BMI 26.04 26.52 26.83  Systolic 127 137  Diastolic 79 75 71  Pulse 95 70 82     Physical Exam  Gen: Alert, well appearing.  Patient is oriented to person, place, time, and situation.. AFFECT: pleasant, lucid thought and speech. No further exam today.  LABS:  Last CBC Lab Results  Component Value Date   WBC 9.7 08/02/2021  HGB 14.2 08/02/2021   HCT 41.8 08/02/2021   MCV 93.6 08/02/2021   MCH 31.5 12/01/2020   RDW 14.1 08/02/2021   PLT 270.0 [id number]   Last metabolic panel Lab Results  Component Value Date   GLUCOSE 165 (H) 08/02/2021   NA 127 (L) 08/02/2021   K 4.0 08/02/2021   CL 91 (L) 08/02/2021   CO2 32 08/02/2021   BUN 14 08/02/2021   CREATININE 1.16 08/02/2021   GFRNONAA >60 09/15/2018   CALCIUM 9.2 08/02/2021   PROT 6.5 08/02/2021   ALBUMIN 4.0 08/02/2021   BILITOT 0.5 08/02/2021   ALKPHOS 118 (H) 08/02/2021   AST 14 08/02/2021   ALT 12 08/02/2021   ANIONGAP 10 09/15/2018   Last hemoglobin A1c Lab Results  Component Value Date   HGBA1C 7.2 (H) 08/02/2021   Last thyroid functions Lab Results  Component Value Date   TSH 24.47 (H) 08/02/2021   T3TOTAL 91 01/12/2021   IMPRESSION AND PLAN:  #1 chronic pain syndrome; bilateral wrist and hand osteoarthritis plus carpal tunnel syndrome. Stable, but I am concerned that he is taking 2 g of Tylenol every day and I want to avoid this from a long-term perspective. Oxycodone apparently caused some itching in the past but he is willing to try this again. Oxycodone 5 mg, 1-2 every 6 hours as needed, #20.  If he tolerates this we will do a new prescription for dispensation of number  180/month. Therapeutic expectations and side effect profile of medication discussed today.  Patient's questions answered. Controlled substance contract in place. Urine tox screen today  #2 hypothyroidism. TSH was elevated last visit 1 month ago so I increased his levothyroxine from 137 mcg/day to 1 and 1/2 of the 137 mcg tabs on Monday, Wednesday, and Friday and continue 1 tab all other days. Hypoth: Takes T4 on empty stomach w/out any other meds. Plan TSH recheck 1 mo.  #3 recurrent major depressive disorder, GAD. Stable on Wellbutrin XL 150 mg a day and citalopram 40 mg a day.  An After Visit Summary was printed and given to the patient.  FOLLOW UP: Return in about 4 weeks (around 09/27/2021) for f/u hypoth and pain.  Signed:  Crissie Sickles, MD           08/30/2021

## 2021-08-31 DIAGNOSIS — G5602 Carpal tunnel syndrome, left upper limb: Secondary | ICD-10-CM | POA: Diagnosis not present

## 2021-09-03 ENCOUNTER — Telehealth: Payer: Self-pay

## 2021-09-03 DIAGNOSIS — E139 Other specified diabetes mellitus without complications: Secondary | ICD-10-CM

## 2021-09-03 MED ORDER — FREESTYLE LIBRE 2 SENSOR MISC: 0 refills | Status: DC

## 2021-09-03 NOTE — Telephone Encounter (Signed)
Patient states that he needs prior auth for his prescription  [facility] Continuous Blood Gluc Sensor (FREESTYLE LIBRE 2 SENSOR) [place] [[id number]]

## 2021-09-06 ENCOUNTER — Telehealth: Payer: Self-pay

## 2021-09-06 NOTE — Telephone Encounter (Signed)
PA sent via covermymed on 09/04/21   Key: [id number] - Rx #: [id number]   Medication: FreeStyle Libre Sensor   Dx: E13.9 - Other specified diabetes mellitus without complications and I10 - Essential (primary) hypertension   Per Dr. Milinda Cave pt has tried and failed n/a   Waiting for response.   Outcome Approved on July 25 Effective from 09/04/2021 through 09/03/2022

## 2021-09-10 ENCOUNTER — Encounter: Payer: Self-pay | Admitting: Family Medicine

## 2021-09-10 NOTE — Telephone Encounter (Signed)
Last rx written was 08/30/21 #20, 1-2 tabs po qid prn. Mentioned in note, "Oxycodone 5 mg, 1-2 every 6 hours as needed, #20.  If he tolerates this we will do a new prescription for dispensation of number 180/month"   Please review and advise

## 2021-09-11 MED ORDER — OXYCODONE HCL 5 MG PO TABS: ORAL_TABLET | ORAL | 0 refills | Status: DC

## 2021-09-11 NOTE — Telephone Encounter (Signed)
Ok, oxycodone rx sent

## 2021-09-13 ENCOUNTER — Encounter: Payer: Self-pay | Admitting: Family Medicine

## 2021-09-13 DIAGNOSIS — G5602 Carpal tunnel syndrome, left upper limb: Secondary | ICD-10-CM | POA: Diagnosis not present

## 2021-09-14 NOTE — Telephone Encounter (Signed)
FYI  Please see below

## 2021-09-18 LAB — DRUG MONITORING PANEL 376104, URINE
Amphetamines: NEGATIVE ng/mL (ref <500)
Barbiturates: NEGATIVE ng/mL (ref <300)
Benzodiazepines: NEGATIVE ng/mL (ref <100)
Cocaine Metabolite: NEGATIVE ng/mL (ref <150)
Codeine: NEGATIVE ng/mL (ref <50)
Desmethyltramadol: NEGATIVE ng/mL (ref <100)
Hydrocodone: 298 ng/mL — ABNORMAL HIGH (ref <50)
Hydromorphone: 162 ng/mL — ABNORMAL HIGH (ref <50)
Morphine: NEGATIVE ng/mL (ref <50)
Norhydrocodone: 1615 ng/mL — ABNORMAL HIGH (ref <50)
Opiates: POSITIVE ng/mL — AB (ref <100)
Oxycodone: NEGATIVE ng/mL (ref <100)
Tramadol: NEGATIVE ng/mL (ref <100)

## 2021-09-18 LAB — DM TEMPLATE

## 2021-09-21 ENCOUNTER — Encounter: Payer: Self-pay | Admitting: Family Medicine

## 2021-09-21 NOTE — Telephone Encounter (Signed)
Last rx 09/11/21 (20,0), pt requesting refill.  Please fill, if appropriate. Med pending

## 2021-09-24 ENCOUNTER — Other Ambulatory Visit: Payer: Self-pay | Admitting: Family Medicine

## 2021-09-24 MED ORDER — OXYCODONE HCL 5 MG PO TABS: ORAL_TABLET | ORAL | 0 refills | Status: DC

## 2021-09-24 NOTE — Telephone Encounter (Signed)
Pt advised refill sent. °

## 2021-09-24 NOTE — Telephone Encounter (Signed)
Pt advised rx sent. Please deny duplicate rx request.

## 2021-10-01 ENCOUNTER — Other Ambulatory Visit: Payer: Self-pay | Admitting: Family Medicine

## 2021-10-03 ENCOUNTER — Encounter: Payer: Self-pay | Admitting: Family Medicine

## 2021-10-03 ENCOUNTER — Ambulatory Visit: Payer: BC Managed Care – PPO | Admitting: Family Medicine

## 2021-10-03 VITALS — BP 133/69 | HR 87 | Temp 97.9°F | Ht 64.0 in | Wt 153.4 lb

## 2021-10-03 DIAGNOSIS — E039 Hypothyroidism, unspecified: Secondary | ICD-10-CM

## 2021-10-03 DIAGNOSIS — M19042 Primary osteoarthritis, left hand: Secondary | ICD-10-CM

## 2021-10-03 DIAGNOSIS — G894 Chronic pain syndrome: Secondary | ICD-10-CM | POA: Diagnosis not present

## 2021-10-03 DIAGNOSIS — M19041 Primary osteoarthritis, right hand: Secondary | ICD-10-CM

## 2021-10-03 DIAGNOSIS — E7800 Pure hypercholesterolemia, unspecified: Secondary | ICD-10-CM

## 2021-10-03 DIAGNOSIS — M19032 Primary osteoarthritis, left wrist: Secondary | ICD-10-CM

## 2021-10-03 DIAGNOSIS — M19031 Primary osteoarthritis, right wrist: Secondary | ICD-10-CM

## 2021-10-03 DIAGNOSIS — Z79899 Other long term (current) drug therapy: Secondary | ICD-10-CM

## 2021-10-03 LAB — LIPID PANEL
Cholesterol: 121 mg/dL (ref 0–200)
HDL: 65.7 mg/dL (ref >39.00)
LDL Cholesterol: 43 mg/dL (ref 0–99)
NonHDL: 54.95
Total CHOL/HDL Ratio: 2
Triglycerides: 62 mg/dL (ref 0.0–149.0)
VLDL: 12.4 mg/dL (ref 0.0–40.0)

## 2021-10-03 LAB — TSH: TSH: 4.06 u[IU]/mL (ref 0.35–5.50)

## 2021-10-03 MED ORDER — CITALOPRAM HYDROBROMIDE 40 MG PO TABS: 40.0000 mg | ORAL_TABLET | Freq: Every day | ORAL | 3 refills | Status: DC

## 2021-10-03 MED ORDER — LEVOTHYROXINE SODIUM 137 MCG PO TABS: ORAL_TABLET | ORAL | 1 refills | Status: DC

## 2021-10-03 MED ORDER — ATORVASTATIN CALCIUM 20 MG PO TABS: 20.0000 mg | ORAL_TABLET | Freq: Every day | ORAL | 3 refills | Status: DC

## 2021-10-03 MED ORDER — BUPROPION HCL ER (XL) 150 MG PO TB24: 150.0000 mg | ORAL_TABLET | Freq: Every day | ORAL | 3 refills | Status: DC

## 2021-10-03 NOTE — Progress Notes (Signed)
OFFICE VISIT  10/03/2021  CC:  Chief Complaint  Patient presents with   Hypothyroidism   Hyperlipidemia   Patient is a 56 y.o. male who presents for 1 month follow-up chronic bilateral wrist and hand pain as well as follow-up hypothyroidism and hypercholesterolemia.  #1 chronic pain syndrome; bilateral wrist and hand osteoarthritis plus carpal tunnel syndrome. Stable, but I am concerned that he is taking 2 g of Tylenol every day and I want to avoid this from a long-term perspective. Oxycodone apparently caused some itching in the past but he is willing to try this again. Oxycodone 5 mg, 1-2 every 6 hours as needed, #20.  If he tolerates this we will do a new prescription for dispensation of number 180/month. Therapeutic expectations and side effect profile of medication discussed today.  Patient's questions answered. Controlled substance contract in place. Urine tox screen today   #2 hypothyroidism. TSH was elevated last visit 1 month ago so I increased his levothyroxine from 137 mcg/day to 1 and 1/2 of the 137 mcg tabs on Monday, Wednesday, and Friday and continue 1 tab all other days. Hypoth: Takes T4 on empty stomach w/out any other meds. Plan TSH recheck 1 mo.   #3 recurrent major depressive disorder, GAD. Stable on Wellbutrin XL 150 mg a day and citalopram 40 mg a day."  INTERIM HX: He states his pain is the same in both wrists and hands. Taking 1-2 oxycodone 2-3 times a day and this does bring to the pain to a tolerable level.  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 He got carpal tunnel release on the left since I last saw him and says this went well.  The sensory changes in the left hand have resolved.  There is plan for carpal tunnel release on the right to be done after his follow-up visit with his orthopedist on 10/11/2021.  Urine drug screen showed appropriate results at last visit.   PMP AWARE reviewed  today: most recent rx for oxycodone was filled 09/24/2021, #60, rx by me. No red flags.  Past Medical History:  Diagnosis Date   Back pain    s/p back surgery   Carpal tunnel syndrome on both sides    Chronic hyponatremia 08/2012 onset   Onset was right after starting amitriptyline for his neuropathic leg pain--has been stable.  Likely ADH-like effect on kidney from amitriptyline.   COVID-19 virus infection 09/03/2020   History of low back pain 06/24/2011   Hyperlipidemia    Hypothyroidism    LADA (latent autoimmune diabetes in adults), managed as type 1 (HCC) Dx'd 2014   Dr. Morrison Old.   Migraines    Neuropathic pain of right lower extremity    Osteoarthritis of hands, bilateral    Osteoarthritis of wrists, bilateral    PLANTAR WART, RIGHT 10/09/2009   Snoring    Sleep study neg  OSA 04/2018   Tobacco dependence    quit 2012; restarted not long after    Past Surgical History:  Procedure Laterality Date   BACK SURGERY  02/12/2007   lower lumbar fusion- rubber disc in back   CARDIOVASCULAR STRESS TEST  03/21/2014   No ischemia/low risk study; however, LV function showed EF 49% and global LV hypokinesis--echo recommended and it showed normal LV fxn.   NASAL SINUS SURGERY  02/11/2005   Polysomnogram  04/2018   NO OSA (Dr. Frances Furbish)   TRANSTHORACIC ECHOCARDIOGRAM  03/24/2014  Normal LV fxn, EF 50-55%, no wall motion abnormalities: (grade 1 diast dysfxn)    Outpatient Medications Prior to Visit  Medication Sig Dispense Refill   amitriptyline (ELAVIL) 25 MG tablet TAKE ONE TABLET BY MOUTH EVERY MORNING AND TAKE THREE TABLETS at bedtime FOR right IN LEG PAIN 120 tablet 5   Continuous Blood Gluc Sensor (FREESTYLE LIBRE 2 SENSOR) MISC APPLY ROUTE EVERY 14 DAYS. 3 each 0   fluticasone (FLONASE) 50 MCG/ACT nasal spray Place 2 sprays into both nostrils daily. 16 g 3   insulin aspart (NOVOLOG FLEXPEN) 100 UNIT/ML FlexPen 15 units qAC 15 mL 11   insulin glargine-yfgn (SEMGLEE, YFGN,) 100  UNIT/ML injection 20 U SQ qd 10 mL 11   Insulin Pen Needle (SURE COMFORT PEN NEEDLES) 32G X 4 MM MISC USE FOUR TIMES DAILY AS NEEDED 100 each 10   losartan (COZAAR) 100 MG tablet Take 1 tablet (100 mg total) by mouth daily. 90 tablet 3   meloxicam (MOBIC) 15 MG tablet Take 1 tablet (15 mg total) by mouth daily. 30 tablet 5   oxyCODONE (OXY IR/ROXICODONE) 5 MG immediate release tablet 1-2 tabs po qid prn 60 tablet 0   pantoprazole (PROTONIX) 40 MG tablet Take 1 tablet (40 mg total) by mouth daily. 90 tablet 3   sildenafil (VIAGRA) 100 MG tablet Take 1 tablet (100 mg total) by mouth daily as needed for erectile dysfunction. 10 tablet 11   tamsulosin (FLOMAX) 0.4 MG CAPS capsule Take 1 capsule (0.4 mg total) by mouth daily after supper. 90 capsule 3   atorvastatin (LIPITOR) 20 MG tablet TAKE ONE TABLET BY MOUTH EVERY DAY 30 tablet 0   buPROPion (WELLBUTRIN XL) 150 MG 24 hr tablet Take 1 tablet (150 mg total) by mouth daily. 30 tablet 0   citalopram (CELEXA) 40 MG tablet Take 1 tablet (40 mg total) by mouth daily. 90 tablet 3   levothyroxine (SYNTHROID) 137 MCG tablet TAKE ONE TABLET BY MOUTH EVERY DAY BEFORE BREAKFAST 30 tablet 0   No facility-administered medications prior to visit.    Allergies  Allergen Reactions   Amlodipine Other (See Comments)    Headache   Augmentin [Amoxicillin-Pot Clavulanate] Nausea And Vomiting   Oxycodone-Acetaminophen     REACTION: itching   Levothyroxine Rash    Had rash associated with generic levothyroxine, but not brand-name Synthroid.  Likely allergic to a dye or filler in the generic preparation.   Lisinopril Cough    ROS As per HPI  PE:    10/03/2021    9:38 AM 08/30/2021    1:03 PM 08/02/2021    1:00 PM  Vitals with BMI  Height 5\' 4"  5\' 4"  5\' 4"   Weight 153 lbs 6 oz 151 lbs 13 oz 154 lbs 10 oz  BMI 26.32 26.04 26.52  Systolic 133 127 294  Diastolic 69 79 75  Pulse 87 95 70     Physical Exam  Neuro: Alert and well-appearing. Hands with  mild bony hypertrophy of the base of the thumb and thumb MCP bilaterally.  No erythema or synovitis. Pain with squeeze of the hands and fingers.  Wrist range of motion intact.  He has impaired squeeze of the left hand due to pain of the thumb.  LABS:  Last CBC Lab Results  Component Value Date   WBC 9.7 08/02/2021   HGB 14.2 08/02/2021   HCT 41.8 08/02/2021   MCV 93.6 08/02/2021   MCH 31.5 12/01/2020   RDW 14.1 08/02/2021   PLT  270.0 08/02/2021   Last metabolic panel Lab Results  Component Value Date   GLUCOSE 165 (H) 08/02/2021   NA 127 (L) 08/02/2021   K 4.0 08/02/2021   CL 91 (L) 08/02/2021   CO2 32 08/02/2021   BUN 14 08/02/2021   CREATININE 1.16 08/02/2021   GFRNONAA >60 09/15/2018   CALCIUM 9.2 08/02/2021   PROT 6.5 08/02/2021   ALBUMIN 4.0 08/02/2021   BILITOT 0.5 08/02/2021   ALKPHOS 118 (H) 08/02/2021   AST 14 08/02/2021   ALT 12 08/02/2021   ANIONGAP 10 09/15/2018   Last lipids Lab Results  Component Value Date   CHOL 107 05/03/2020   HDL 61.40 05/03/2020   LDLCALC 38 05/03/2020   TRIG 36.0 05/03/2020   CHOLHDL 2 05/03/2020   Last hemoglobin A1c Lab Results  Component Value Date   HGBA1C 7.2 (H) 08/02/2021   Last thyroid functions Lab Results  Component Value Date   TSH 24.47 (H) 08/02/2021   T3TOTAL 91 01/12/2021   IMPRESSION AND PLAN:  #1 chronic pain syndrome.  Bilateral wrist and hand arthritis.  He has bilateral carpal tunnel syndrome as well and this was addressed with surgery on the left recently and this went well. He will be having right wrist carpal tunnel release surgery in a couple weeks.  After that point hand surgeon will address any potential surgical procedures for his wrist/hands. We will continue with oxycodone 5 mg, 1-2 tabs up to 3 times a day as needed. Controlled substance contract is in place. Urine drug screen is up-to-date.  2.  Hypothyroidism.  TSH was elevated two months ago so I increased his levothyroxine from 137  mcg/day to 1 and 1/2 of the 137 mcg tabs on Monday, Wednesday, and Friday and continue 1 tab all other days. Hypoth: Takes T4 on empty stomach w/out any other meds. TSH today.  #3 hypercholesterolemia.  Doing well long-term on a atorvastatin 20 mg a day. Fasting lipid panel today.  An After Visit Summary was printed and given to the patient.  FOLLOW UP: Return in about 3 months (around 01/03/2022) for routine chronic illness f/u.  Signed:  Santiago Bumpers, MD           10/03/2021

## 2021-10-06 ENCOUNTER — Other Ambulatory Visit: Payer: Self-pay | Admitting: Family Medicine

## 2021-10-08 MED ORDER — OXYCODONE HCL 5 MG PO TABS: ORAL_TABLET | ORAL | 0 refills | Status: DC

## 2021-10-08 NOTE — Telephone Encounter (Signed)
Requesting: Oxycodone Contract: 08/02/21 UDS:08/30/21 Last Visit: 10/03/21 Next Visit: 01/07/22 Last Refill: 09/24/21( 60,0) 1-2 tabs qid  Please Advise. Medication pending

## 2021-10-23 ENCOUNTER — Encounter: Payer: Self-pay | Admitting: Family Medicine

## 2021-10-24 MED ORDER — OXYCODONE HCL 5 MG PO TABS: ORAL_TABLET | ORAL | 0 refills | Status: DC

## 2021-10-24 NOTE — Telephone Encounter (Signed)
Pt advised refill sent. °

## 2021-10-24 NOTE — Telephone Encounter (Signed)
Requesting: Oxycodone Contract: n/a for this med UDS: 08/30/21 Last Visit: 10/03/21 Next Visit: 01/07/22 Last Refill: 10/08/21 (90,0)  Please Advise. Med pending

## 2021-10-26 DIAGNOSIS — G5601 Carpal tunnel syndrome, right upper limb: Secondary | ICD-10-CM | POA: Diagnosis not present

## 2021-11-08 DIAGNOSIS — M79642 Pain in left hand: Secondary | ICD-10-CM | POA: Diagnosis not present

## 2021-11-08 DIAGNOSIS — G5601 Carpal tunnel syndrome, right upper limb: Secondary | ICD-10-CM | POA: Diagnosis not present

## 2021-11-08 DIAGNOSIS — M1812 Unilateral primary osteoarthritis of first carpometacarpal joint, left hand: Secondary | ICD-10-CM | POA: Diagnosis not present

## 2021-11-14 ENCOUNTER — Encounter: Payer: Self-pay | Admitting: Family Medicine

## 2021-11-14 ENCOUNTER — Other Ambulatory Visit: Payer: Self-pay | Admitting: Family Medicine

## 2021-11-14 MED ORDER — OXYCODONE HCL 5 MG PO TABS: ORAL_TABLET | ORAL | 0 refills | Status: DC

## 2021-11-14 NOTE — Telephone Encounter (Signed)
Requesting: Oxycodone Contract: n/a for this med UDS: 08/30/21 Last Visit: 10/03/21 Next Visit: 01/07/22 Last Refill: 10/24/21(90,0)   Pt sent mychart message stating: I had my second surgery. The numbness is better. But the pain is the same. I have to go back to him in 4 weeks to discuss the thumb replacement you and I talked about. Can I get a refill on my pain med? Just wanted to keep you updated. Thanks   Please Advise. Med pending.

## 2021-11-26 ENCOUNTER — Other Ambulatory Visit: Payer: Self-pay | Admitting: Family Medicine

## 2021-12-02 ENCOUNTER — Other Ambulatory Visit: Payer: Self-pay | Admitting: Family Medicine

## 2021-12-03 MED ORDER — OXYCODONE HCL 5 MG PO TABS: ORAL_TABLET | ORAL | 0 refills | Status: DC

## 2021-12-03 NOTE — Telephone Encounter (Signed)
Pt advised refill sent via mychart. 

## 2021-12-03 NOTE — Telephone Encounter (Signed)
Requesting: Oxycodone Contract: n/a for this med UDS: 08/30/21 Last Visit:  10/03/21 Next Visit:01/07/22 Last Refill: 11/14/21 (90,0)  Please Advise. Med pending

## 2021-12-16 ENCOUNTER — Other Ambulatory Visit: Payer: Self-pay | Admitting: Family Medicine

## 2021-12-17 MED ORDER — OXYCODONE HCL 5 MG PO TABS: ORAL_TABLET | ORAL | 0 refills | Status: DC

## 2021-12-17 NOTE — Telephone Encounter (Signed)
Pt advised refill sent. °

## 2021-12-17 NOTE — Telephone Encounter (Signed)
Requesting: Oxycodone Contract: 08/02/21 (Vicodin) UDS: 08/30/21 Last Visit: 10/03/21 Next Visit: 01/07/22 Last Refill: 12/03/21( 90,0)  Please Advise. Med pending

## 2021-12-18 ENCOUNTER — Telehealth: Payer: Self-pay

## 2021-12-18 NOTE — Telephone Encounter (Signed)
Paperwork: Short Term Disability Forms  Individual made aware of 3-5 business day turn around (Y/N): No   Office form(s) completed and placed with paperwork (Y/N): No   Form location: PCP desk

## 2021-12-20 DIAGNOSIS — Z0279 Encounter for issue of other medical certificate: Secondary | ICD-10-CM

## 2021-12-20 NOTE — Telephone Encounter (Signed)
Forms faxed back.

## 2021-12-20 NOTE — Telephone Encounter (Signed)
Signed and put in box to go up front. Signed:  Phil Raylinn Kosar, MD           12/20/2021  

## 2022-01-07 ENCOUNTER — Other Ambulatory Visit: Payer: Self-pay | Admitting: Family Medicine

## 2022-01-07 ENCOUNTER — Ambulatory Visit: Payer: BC Managed Care – PPO | Admitting: Family Medicine

## 2022-01-07 DIAGNOSIS — E139 Other specified diabetes mellitus without complications: Secondary | ICD-10-CM

## 2022-01-07 MED ORDER — OXYCODONE HCL 5 MG PO TABS: ORAL_TABLET | ORAL | 0 refills | Status: DC

## 2022-01-07 NOTE — Telephone Encounter (Signed)
Requesting: Oxycodone Contract: 08/02/21 (Vicodin) UDS: 08/30/21 Last Visit: 10/03/21 Next Visit: 01/14/22 (appt was cancelled today by provider) Last Refill: 12/03/21( 90,0)   Please Advise. Med pending

## 2022-01-10 ENCOUNTER — Other Ambulatory Visit: Payer: Self-pay

## 2022-01-10 ENCOUNTER — Telehealth: Payer: Self-pay | Admitting: Family Medicine

## 2022-01-10 DIAGNOSIS — E139 Other specified diabetes mellitus without complications: Secondary | ICD-10-CM

## 2022-01-10 MED ORDER — FREESTYLE LIBRE 2 SENSOR MISC: 0 refills | Status: DC

## 2022-01-10 NOTE — Telephone Encounter (Signed)
Patient needs libre sensor. He reports that he needs them sent to his pharmacy which is [facility].

## 2022-01-14 ENCOUNTER — Ambulatory Visit: Payer: BC Managed Care – PPO | Admitting: Family Medicine

## 2022-01-14 VITALS — BP 126/84 | HR 94 | Temp 98.6°F | Ht 64.0 in | Wt 151.0 lb

## 2022-01-14 DIAGNOSIS — G894 Chronic pain syndrome: Secondary | ICD-10-CM | POA: Diagnosis not present

## 2022-01-14 DIAGNOSIS — M79641 Pain in right hand: Secondary | ICD-10-CM | POA: Diagnosis not present

## 2022-01-14 DIAGNOSIS — M79642 Pain in left hand: Secondary | ICD-10-CM | POA: Diagnosis not present

## 2022-01-14 DIAGNOSIS — R109 Unspecified abdominal pain: Secondary | ICD-10-CM | POA: Diagnosis not present

## 2022-01-14 LAB — POCT URINALYSIS DIPSTICK
Bilirubin, UA: NEGATIVE
Glucose, UA: NEGATIVE
Ketones, UA: NEGATIVE
Leukocytes, UA: NEGATIVE
Nitrite, UA: NEGATIVE
Protein, UA: POSITIVE — AB
Spec Grav, UA: 1.01 (ref 1.010–1.025)
Urobilinogen, UA: 0.2 E.U./dL
pH, UA: 7 (ref 5.0–8.0)

## 2022-01-14 NOTE — Progress Notes (Signed)
OFFICE VISIT  01/14/2022  CC:  Chief Complaint  Patient presents with   Chronic pain syndrome    Patient is a 56 y.o. male who presents for 21-month follow-up chronic pain syndrome. A/P as of last visit: "#1 chronic pain syndrome.  Bilateral wrist and hand arthritis.  He has bilateral carpal tunnel syndrome as well and this was addressed with surgery on the left recently and this went well. He will be having right wrist carpal tunnel release surgery in a couple weeks.  After that point hand surgeon will address any potential surgical procedures for his wrist/hands. We will continue with oxycodone 5 mg, 1-2 tabs up to 3 times a day as needed. Controlled substance contract is in place. Urine drug screen is up-to-date.   2.  Hypothyroidism.  TSH was elevated two months ago so I increased his levothyroxine from 137 mcg/day to 1 and 1/2 of the 137 mcg tabs on Monday, Wednesday, and Friday and continue 1 tab all other days. Hypoth: Takes T4 on empty stomach w/out any other meds. TSH today.   #3 hypercholesterolemia.  Doing well long-term on a atorvastatin 20 mg a day. Fasting lipid panel today."  INTERIM HX: Pain severe, constant, takes 2 oxy 5mg  tid-qid, helps mild/mod amount Plan now for L thumb joint replacement surg 01/25/22. Fortunately his CTS surgeries helped each hand regarding CTS symptoms. R hand still mod/severe as well.  Also, he recently had a day or 2 of pain in the right flank, no preceding injury.  The pain did not radiated into the groin.  Urine had an abnormal odor recently and sometimes looked a little bit like tea. No fever, no dysuria, no unusual urgency or frequency.  PMP AWARE reviewed today: most recent rx for oxycodone 5 mg was filled 01/07/2022, #90, rx by me. No red flags.  Past Medical History:  Diagnosis Date   Back pain    s/p back surgery   Carpal tunnel syndrome on both sides    Chronic hyponatremia 08/2012 onset   Onset was right after starting  amitriptyline for his neuropathic leg pain--has been stable.  Likely ADH-like effect on kidney from amitriptyline.   COVID-19 virus infection 09/03/2020   History of low back pain 06/24/2011   Hyperlipidemia    Hypothyroidism    LADA (latent autoimmune diabetes in adults), managed as type 1 (HCC) Dx'd 2014   Dr. 2015.   Migraines    Neuropathic pain of right lower extremity    Osteoarthritis of hands, bilateral    Osteoarthritis of wrists, bilateral    PLANTAR WART, RIGHT 10/09/2009   Snoring    Sleep study neg  OSA 04/2018   Tobacco dependence    quit 2012; restarted not long after    Past Surgical History:  Procedure Laterality Date   BACK SURGERY  02/12/2007   lower lumbar fusion- rubber disc in back   CARDIOVASCULAR STRESS TEST  03/21/2014   No ischemia/low risk study; however, LV function showed EF 49% and global LV hypokinesis--echo recommended and it showed normal LV fxn.   NASAL SINUS SURGERY  02/11/2005   Polysomnogram  04/2018   NO OSA (Dr. 05/2018)   TRANSTHORACIC ECHOCARDIOGRAM  03/24/2014   Normal LV fxn, EF 50-55%, no wall motion abnormalities: (grade 1 diast dysfxn)    Outpatient Medications Prior to Visit  Medication Sig Dispense Refill   amitriptyline (ELAVIL) 25 MG tablet TAKE ONE TABLET BY MOUTH EVERY MORNING AND TAKE THREE TABLETS at bedtime FOR right IN LEG  PAIN 120 tablet 5   atorvastatin (LIPITOR) 20 MG tablet Take 1 tablet (20 mg total) by mouth daily. 90 tablet 3   buPROPion (WELLBUTRIN XL) 150 MG 24 hr tablet Take 1 tablet (150 mg total) by mouth daily. 90 tablet 3   citalopram (CELEXA) 40 MG tablet Take 1 tablet (40 mg total) by mouth daily. 90 tablet 3   Continuous Blood Gluc Sensor (FREESTYLE LIBRE 2 SENSOR) MISC APPLY ROUTE EVERY 14 DAYS. Needs OV for refills 2 each 0   fluticasone (FLONASE) 50 MCG/ACT nasal spray Place 2 sprays into both nostrils daily. 16 g 3   insulin aspart (NOVOLOG FLEXPEN) 100 UNIT/ML FlexPen INJECT 15 UNITS NINTO THE SKIN  BEFORE EVERY A MEAL 15 mL 0   insulin glargine-yfgn (SEMGLEE, YFGN,) 100 UNIT/ML Pen INJECT 20 UNITS SUBCUTANEOUSLY ONCE DAILY AS DIRECTED 15 mL 0   Insulin Pen Needle (SURE COMFORT PEN NEEDLES) 32G X 4 MM MISC USE FOUR TIMES DAILY AS NEEDED 100 each 10   levothyroxine (SYNTHROID) 137 MCG tablet Take 1-1/2 tabs on Mondays Wednesdays and Fridays and 1 tab all other days. 96 tablet 1   losartan (COZAAR) 100 MG tablet Take 1 tablet (100 mg total) by mouth daily. 90 tablet 3   meloxicam (MOBIC) 15 MG tablet Take 1 tablet (15 mg total) by mouth daily. 30 tablet 5   oxyCODONE (OXY IR/ROXICODONE) 5 MG immediate release tablet 1-2 tabs po qid prn 90 tablet 0   pantoprazole (PROTONIX) 40 MG tablet Take 1 tablet (40 mg total) by mouth daily. 90 tablet 3   sildenafil (VIAGRA) 100 MG tablet Take 1 tablet (100 mg total) by mouth daily as needed for erectile dysfunction. 10 tablet 11   tamsulosin (FLOMAX) 0.4 MG CAPS capsule Take 1 capsule (0.4 mg total) by mouth daily after supper. 90 capsule 3   No facility-administered medications prior to visit.    Allergies  Allergen Reactions   Amlodipine Other (See Comments)    Headache   Augmentin [Amoxicillin-Pot Clavulanate] Nausea And Vomiting   Oxycodone-Acetaminophen     REACTION: itching   Levothyroxine Rash    Had rash associated with generic levothyroxine, but not brand-name Synthroid.  Likely allergic to a dye or filler in the generic preparation.   Lisinopril Cough    ROS As per HPI  PE:    01/14/2022    1:33 PM 10/03/2021    9:38 AM 08/30/2021    1:03 PM  Vitals with BMI  Height 5\' 4"  5\' 4"  5\' 4"   Weight 151 lbs 153 lbs 6 oz 151 lbs 13 oz  BMI 25.91 26.32 26.04  Systolic 126 133  Diastolic 84 69 79  Pulse 94 87 95   Physical Exam  Gen: Alert, well appearing.  Patient is oriented to person, place, time, and situation. AFFECT: pleasant, lucid thought and speech. No further exam.  LABS:  Last CBC Lab Results  Component Value  Date   WBC 9.7 08/02/2021   HGB 14.2 08/02/2021   HCT 41.8 08/02/2021   MCV 93.6 08/02/2021   MCH 31.5 12/01/2020   RDW 14.1 08/02/2021   PLT 270.0 08/02/2021   Last metabolic panel Lab Results  Component Value Date   GLUCOSE 165 (H) 08/02/2021   NA 127 (L) 08/02/2021   K 4.0 08/02/2021   CL 91 (L) 08/02/2021   CO2 32 08/02/2021   BUN 14 08/02/2021   CREATININE 1.16 08/02/2021   GFRNONAA >60 09/15/2018   CALCIUM 9.2 08/02/2021  PROT 6.5 08/02/2021   ALBUMIN 4.0 08/02/2021   BILITOT 0.5 08/02/2021   ALKPHOS 118 (H) 08/02/2021   AST 14 08/02/2021   ALT 12 08/02/2021   ANIONGAP 10 09/15/2018   Last lipids Lab Results  Component Value Date   CHOL 121 10/03/2021   HDL 65.70 10/03/2021   LDLCALC 43 10/03/2021   TRIG 62.0 10/03/2021   CHOLHDL 2 10/03/2021   Last hemoglobin A1c Lab Results  Component Value Date   HGBA1C 7.2 (H) 08/02/2021   Last thyroid functions Lab Results  Component Value Date   TSH 4.06 10/03/2021   T3TOTAL 91 01/12/2021   IMPRESSION AND PLAN:  #1 chronic pain syndrome.  Bilateral wrist and hand arthritis. The general plan is for him to have surgery on both wrists/thumbs.  Left thumb joint replacement surgery already scheduled for 01/25/2022.  He had carpal tunnel surgery bilaterally and this has helped his compression neuropathy symptoms.  #2 right flank pain.  Resolved. UA today: 3+ blood, 1+ protein. Sent urine for culture and sensitivity. KUB ordered.  An After Visit Summary was printed and given to the patient.  FOLLOW UP: Return in about 6 weeks (around 02/25/2022) for f/u pain hands.  Signed:  Santiago Bumpers, MD           01/14/2022

## 2022-01-16 LAB — URINE CULTURE
MICRO NUMBER:: 14266802
SPECIMEN QUALITY:: ADEQUATE

## 2022-01-17 ENCOUNTER — Ambulatory Visit (HOSPITAL_BASED_OUTPATIENT_CLINIC_OR_DEPARTMENT_OTHER)
Admission: RE | Admit: 2022-01-17 | Discharge: 2022-01-17 | Disposition: A | Discharge status: Home / Self Care | Payer: BC Managed Care – PPO | Source: Ambulatory Visit | Attending: Family Medicine | Admitting: Family Medicine

## 2022-01-17 ENCOUNTER — Telehealth: Payer: Self-pay

## 2022-01-17 DIAGNOSIS — R109 Unspecified abdominal pain: Secondary | ICD-10-CM | POA: Diagnosis not present

## 2022-01-17 NOTE — Telephone Encounter (Signed)
error 

## 2022-01-21 ENCOUNTER — Other Ambulatory Visit: Payer: Self-pay | Admitting: Family Medicine

## 2022-01-23 ENCOUNTER — Encounter: Payer: Self-pay | Admitting: Family Medicine

## 2022-01-24 MED ORDER — OXYCODONE HCL 5 MG PO TABS: ORAL_TABLET | ORAL | 0 refills | Status: DC

## 2022-01-24 NOTE — Telephone Encounter (Signed)
Oxycodone prescription just sent. Reassure patient that I will be continuing to prescribe his oxycodone and I definitely want to control his pain. However, due to safety reasons and strict medical board oversight of controlled substance prescriptions I have to be careful not to over- subscribe this for him.

## 2022-01-25 DIAGNOSIS — M19032 Primary osteoarthritis, left wrist: Secondary | ICD-10-CM | POA: Diagnosis not present

## 2022-01-25 DIAGNOSIS — M1812 Unilateral primary osteoarthritis of first carpometacarpal joint, left hand: Secondary | ICD-10-CM | POA: Diagnosis not present

## 2022-01-31 ENCOUNTER — Other Ambulatory Visit: Payer: Self-pay | Admitting: Family Medicine

## 2022-02-07 ENCOUNTER — Other Ambulatory Visit: Payer: Self-pay | Admitting: Family Medicine

## 2022-02-07 DIAGNOSIS — M25642 Stiffness of left hand, not elsewhere classified: Secondary | ICD-10-CM | POA: Diagnosis not present

## 2022-02-07 DIAGNOSIS — M1812 Unilateral primary osteoarthritis of first carpometacarpal joint, left hand: Secondary | ICD-10-CM | POA: Diagnosis not present

## 2022-02-10 ENCOUNTER — Other Ambulatory Visit: Payer: Self-pay | Admitting: Family Medicine

## 2022-02-12 NOTE — Telephone Encounter (Signed)
Requesting: Oxycodone Contract: 08/02/21 (Vicodin) UDS: 08/30/21 Last Visit: 01/14/22 Next Visit: 02/25/22 Last Refill: 112/04/23 90,0)   Please Advise. Med pending

## 2022-02-13 MED ORDER — OXYCODONE HCL 5 MG PO TABS: ORAL_TABLET | ORAL | 0 refills | Status: DC

## 2022-02-14 ENCOUNTER — Other Ambulatory Visit: Payer: Self-pay | Admitting: Family Medicine

## 2022-02-14 ENCOUNTER — Encounter: Payer: Self-pay | Admitting: Family Medicine

## 2022-02-14 DIAGNOSIS — M25642 Stiffness of left hand, not elsewhere classified: Secondary | ICD-10-CM | POA: Diagnosis not present

## 2022-02-14 DIAGNOSIS — M79642 Pain in left hand: Secondary | ICD-10-CM | POA: Diagnosis not present

## 2022-02-14 MED ORDER — INSULIN GLARGINE-YFGN 100 UNIT/ML ~~LOC~~ SOPN: PEN_INJECTOR | SUBCUTANEOUS | 0 refills | Status: DC

## 2022-02-14 MED ORDER — NOVOLOG FLEXPEN 100 UNIT/ML ~~LOC~~ SOPN: PEN_INJECTOR | SUBCUTANEOUS | 0 refills | Status: DC

## 2022-02-25 ENCOUNTER — Encounter: Payer: Self-pay | Admitting: Family Medicine

## 2022-02-25 ENCOUNTER — Telehealth: Payer: Self-pay | Admitting: Family Medicine

## 2022-02-25 ENCOUNTER — Ambulatory Visit: Payer: BC Managed Care – PPO | Admitting: Family Medicine

## 2022-02-25 VITALS — BP 124/74 | HR 97 | Temp 98.4°F | Ht 64.0 in | Wt 154.4 lb

## 2022-02-25 DIAGNOSIS — E039 Hypothyroidism, unspecified: Secondary | ICD-10-CM | POA: Diagnosis not present

## 2022-02-25 DIAGNOSIS — E139 Other specified diabetes mellitus without complications: Secondary | ICD-10-CM | POA: Diagnosis not present

## 2022-02-25 DIAGNOSIS — E108 Type 1 diabetes mellitus with unspecified complications: Secondary | ICD-10-CM

## 2022-02-25 DIAGNOSIS — I10 Essential (primary) hypertension: Secondary | ICD-10-CM

## 2022-02-25 LAB — BASIC METABOLIC PANEL
BUN: 12 mg/dL (ref 6–23)
CO2: 29 mEq/L (ref 19–32)
Calcium: 8.7 mg/dL (ref 8.4–10.5)
Chloride: 89 mEq/L — ABNORMAL LOW (ref 96–112)
Creatinine, Ser: 0.98 mg/dL (ref 0.40–1.50)
GFR: 85.83 mL/min (ref >60.00)
Glucose, Bld: 123 mg/dL — ABNORMAL HIGH (ref 70–99)
Potassium: 4.6 mEq/L (ref 3.5–5.1)
Sodium: 126 mEq/L — ABNORMAL LOW (ref 135–145)

## 2022-02-25 LAB — HEMOGLOBIN A1C: Hgb A1c MFr Bld: 7.9 % — ABNORMAL HIGH (ref 4.6–6.5)

## 2022-02-25 LAB — TSH: TSH: 9.16 u[IU]/mL — ABNORMAL HIGH (ref 0.35–5.50)

## 2022-02-25 NOTE — Progress Notes (Signed)
OFFICE VISIT  02/25/2022  CC:  Chief Complaint  Patient presents with   Medical Management of Chronic Issues    Patient is a 57 y.o. male who presents for 6 wk f/u chronic pain syndrome-->bilat wrist and hands pain d/t severe osteoarthritis. A/P as of last visit: "#1 chronic pain syndrome.  Bilateral wrist and hand arthritis. The general plan is for him to have surgery on both wrists/thumbs.  Left thumb joint replacement surgery already scheduled for 01/25/2022.  He had carpal tunnel surgery bilaterally and this has helped his compression neuropathy symptoms.   #2 right flank pain.  Resolved. UA today: 3+ blood, 1+ protein. Sent urine for culture and sensitivity. KUB ordered."  INTERIM HX: He got left hand/thumb surgery on 01/25/2022 (tendon transfer), says things went very well and he is not having to use as much pain medicine: He says typically 1 tab in the morning and 1 tab around bedtime lately.  He also takes Tylenol and meloxicam regularly.  He has been getting PT a couple of days a week since the surgery.  His next follow-up with Dr. Yehuda Budd is later this month. Glucoses average around 150 over the last several months.  He did have a period of increased glucoses from 175-250 when he had a cold around a month ago. He takes 20 units of Semglee daily and anywhere from 0 to 10 units of NovoLog with each meal.  His right flank pain resolved basically the day after I saw him last.  PMP AWARE reviewed today: most recent rx for oxycodone 5mg  was filled 02/13/22, # 90, rx by me. No red flags.  ROS as above, plus--> no fevers, no CP, no SOB, no wheezing, no cough, no dizziness, no HAs, no rashes, no melena/hematochezia.  No polyuria or polydipsia.  No myalgias . No focal weakness, paresthesias, or tremors.  No acute vision or hearing abnormalities.  No dysuria or unusual/new urinary urgency or frequency.  No recent changes in lower legs. No n/v/d or abd pain.  No palpitations.    Past  Medical History:  Diagnosis Date   Back pain    s/p back surgery   Carpal tunnel syndrome on both sides    Chronic hyponatremia 08/2012 onset   Onset was right after starting amitriptyline for his neuropathic leg pain--has been stable.  Likely ADH-like effect on kidney from amitriptyline.   COVID-19 virus infection 09/03/2020   History of low back pain 06/24/2011   Hyperlipidemia    Hypothyroidism    LADA (latent autoimmune diabetes in adults), managed as type 1 (HCC) Dx'd 2014   Dr. 2015.   Migraines    Neuropathic pain of right lower extremity    Osteoarthritis of hands, bilateral    Osteoarthritis of wrists, bilateral    PLANTAR WART, RIGHT 10/09/2009   Snoring    Sleep study neg  OSA 04/2018   Tobacco dependence    quit 2012; restarted not long after    Past Surgical History:  Procedure Laterality Date   BACK SURGERY  02/12/2007   lower lumbar fusion- rubber disc in back   CARDIOVASCULAR STRESS TEST  03/21/2014   No ischemia/low risk study; however, LV function showed EF 49% and global LV hypokinesis--echo recommended and it showed normal LV fxn.   NASAL SINUS SURGERY  02/11/2005   Polysomnogram  04/2018   NO OSA (Dr. 05/2018)   TRANSTHORACIC ECHOCARDIOGRAM  03/24/2014   Normal LV fxn, EF 50-55%, no wall motion abnormalities: (grade 1 diast dysfxn)  Outpatient Medications Prior to Visit  Medication Sig Dispense Refill   amitriptyline (ELAVIL) 25 MG tablet TAKE ONE TABLET BY MOUTH EVERY MORNING AND TAKE THREE TABLETS at bedtime FOR right IN LEG PAIN 120 tablet 5   atorvastatin (LIPITOR) 20 MG tablet Take 1 tablet (20 mg total) by mouth daily. 90 tablet 3   buPROPion (WELLBUTRIN XL) 150 MG 24 hr tablet Take 1 tablet (150 mg total) by mouth daily. 90 tablet 3   citalopram (CELEXA) 40 MG tablet Take 1 tablet (40 mg total) by mouth daily. 90 tablet 3   Continuous Blood Gluc Sensor (FREESTYLE LIBRE 2 SENSOR) MISC APPLY ROUTE EVERY 14 DAYS. Needs OV for refills 2 each 0    fluticasone (FLONASE) 50 MCG/ACT nasal spray Place 2 sprays into both nostrils daily. 16 g 3   insulin aspart (NOVOLOG FLEXPEN) 100 UNIT/ML FlexPen INJECT 15 UNITS NINTO THE SKIN BEFORE EVERY A MEAL 15 mL 0   insulin glargine-yfgn (SEMGLEE, YFGN,) 100 UNIT/ML Pen INJECT 20 UNITS SUBCUTANEOUSLY ONCE DAILY AS DIRECTED 15 mL 0   Insulin Pen Needle (SURE COMFORT PEN NEEDLES) 32G X 4 MM MISC USE FOUR TIMES DAILY AS NEEDED 100 each 10   levothyroxine (SYNTHROID) 137 MCG tablet Take 1-1/2 tabs on Mondays Wednesdays and Fridays and 1 tab all other days. 96 tablet 0   losartan (COZAAR) 100 MG tablet Take 1 tablet (100 mg total) by mouth daily. 90 tablet 3   meloxicam (MOBIC) 15 MG tablet Take 1 tablet (15 mg total) by mouth daily. 30 tablet 5   oxyCODONE (OXY IR/ROXICODONE) 5 MG immediate release tablet 1-2 tabs po qid prn 90 tablet 0   pantoprazole (PROTONIX) 40 MG tablet Take 1 tablet (40 mg total) by mouth daily. 90 tablet 3   sildenafil (VIAGRA) 100 MG tablet Take 1 tablet (100 mg total) by mouth daily as needed for erectile dysfunction. 10 tablet 11   tamsulosin (FLOMAX) 0.4 MG CAPS capsule Take 1 capsule (0.4 mg total) by mouth daily after supper. 90 capsule 3   No facility-administered medications prior to visit.    Allergies  Allergen Reactions   Amlodipine Other (See Comments)    Headache   Augmentin [Amoxicillin-Pot Clavulanate] Nausea And Vomiting   Oxycodone-Acetaminophen     REACTION: itching   Levothyroxine Rash    Had rash associated with generic levothyroxine, but not brand-name Synthroid.  Likely allergic to a dye or filler in the generic preparation.   Lisinopril Cough    Review of Systems As per HPI  PE:    02/25/2022    1:06 PM 01/14/2022    1:33 PM 10/03/2021    9:38 AM  Vitals with BMI  Height 5\' 4"  5\' 4"  5\' 4"   Weight 154 lbs 6 oz 151 lbs 153 lbs 6 oz  BMI 26.49 16.10 96.04  Systolic 540 981 191  Diastolic 74 84 69  Pulse 97 94 87     Physical Exam  Gen:  Alert, well appearing.  Patient is oriented to person, place, time, and situation. AFFECT: pleasant, lucid thought and speech. Left wrist over the base of the thumb there is mild the swollen diffusely but no erythema or warmth.  No fluctuance.  He has range of motion of the first [facility] that is moderately impaired in all ranges. Similar impairment of range of motion in the first MCP joint.  LABS:  Last CBC Lab Results  Component Value Date   WBC 9.7 08/02/2021   HGB 14.2 08/02/2021  HCT 41.8 08/02/2021   MCV 93.6 08/02/2021   MCH 31.5 12/01/2020   RDW 14.1 08/02/2021   PLT 270.0 19/50/9326   Last metabolic panel Lab Results  Component Value Date   GLUCOSE 165 (H) 08/02/2021   NA 127 (L) 08/02/2021   K 4.0 08/02/2021   CL 91 (L) 08/02/2021   CO2 32 08/02/2021   BUN 14 08/02/2021   CREATININE 1.16 08/02/2021   GFRNONAA >60 09/15/2018   CALCIUM 9.2 08/02/2021   PROT 6.5 08/02/2021   ALBUMIN 4.0 08/02/2021   BILITOT 0.5 08/02/2021   ALKPHOS 118 (H) 08/02/2021   AST 14 08/02/2021   ALT 12 08/02/2021   ANIONGAP 10 09/15/2018   Lab Results  Component Value Date   HGBA1C 7.2 (H) 08/02/2021   Lab Results  Component Value Date   TSH 4.06 10/03/2021   IMPRESSION AND PLAN:  #1 chronic pain syndrome: Bilateral wrist and hand osteoarthritis.  He is status post bilateral carpal tunnel surgery last year and 1 month ago had left wrist/thumb surgery.  He has improved. Hopefully he can minimize oxycodone even more over time. At the time being he seems to be doing okay on 2-3 oxycodone per day.  Okay to continue meloxicam daily and Tylenol couple times a day. Monitoring renal function. At this point they do not have plans to operate on his right wrist/hand unless his pain persistently worsens.  #2 LADA.  20 units of Semglee per day and 0 to 10 units NovoLog before every meal. Hemoglobin A1c today. I am managing this problem now, patient says co-pay with specialist is too high.  3.   Hypoth: Takes T4 on empty stomach w/out any other meds (134mcg tabs, 1 and 1/2 qd M/W/F, 1 qd all other days). TSH today.  #4 hypertension, well-controlled on losartan 100 mg a day. Electrolytes and creatinine today.  An After Visit Summary was printed and given to the patient.  FOLLOW UP: Return in about 3 months (around 05/27/2022) for routine chronic illness f/u.  Signed:  Crissie Sickles, MD           02/25/2022

## 2022-02-25 NOTE — Telephone Encounter (Signed)
PA will be needed for [facility] but he wanted to know if he can increase Novolog until PA completed.  Please review and advise

## 2022-02-25 NOTE — Telephone Encounter (Signed)
Pt is needing his long active insulin (Semglee). He reports that the pharmacy has told them Dr. Anitra Lauth needs to send approval over for this to be available to pick up. He is currently out of this insulin completely.

## 2022-02-26 ENCOUNTER — Other Ambulatory Visit: Payer: Self-pay | Admitting: Family Medicine

## 2022-02-27 MED ORDER — NOVOLOG FLEXPEN 100 UNIT/ML ~~LOC~~ SOPN: PEN_INJECTOR | SUBCUTANEOUS | 0 refills | Status: DC

## 2022-02-27 NOTE — Telephone Encounter (Signed)
No PA required for this medication.

## 2022-02-28 DIAGNOSIS — M25642 Stiffness of left hand, not elsewhere classified: Secondary | ICD-10-CM | POA: Diagnosis not present

## 2022-02-28 DIAGNOSIS — M79642 Pain in left hand: Secondary | ICD-10-CM | POA: Diagnosis not present

## 2022-03-07 ENCOUNTER — Other Ambulatory Visit: Payer: Self-pay | Admitting: Family Medicine

## 2022-03-07 ENCOUNTER — Encounter: Payer: Self-pay | Admitting: Family Medicine

## 2022-03-07 DIAGNOSIS — E139 Other specified diabetes mellitus without complications: Secondary | ICD-10-CM

## 2022-03-07 DIAGNOSIS — M25642 Stiffness of left hand, not elsewhere classified: Secondary | ICD-10-CM | POA: Diagnosis not present

## 2022-03-08 MED ORDER — FREESTYLE LIBRE 2 SENSOR MISC: 5 refills | Status: DC

## 2022-03-13 ENCOUNTER — Other Ambulatory Visit: Payer: Self-pay | Admitting: Family Medicine

## 2022-03-14 MED ORDER — OXYCODONE HCL 5 MG PO TABS: ORAL_TABLET | ORAL | 0 refills | Status: DC

## 2022-03-14 NOTE — Telephone Encounter (Signed)
Pt advised refill sent. °

## 2022-03-14 NOTE — Telephone Encounter (Signed)
Requesting: oxycodone Contract: n/a UDS: 08/30/21 Last Visit: 02/25/22 Next Visit: 05/27/22 Last Refill: 02/13/22 (90,0)  Please Advise. Med pending

## 2022-03-15 DIAGNOSIS — M25642 Stiffness of left hand, not elsewhere classified: Secondary | ICD-10-CM | POA: Diagnosis not present

## 2022-03-15 DIAGNOSIS — M79642 Pain in left hand: Secondary | ICD-10-CM | POA: Diagnosis not present

## 2022-03-28 DIAGNOSIS — M79642 Pain in left hand: Secondary | ICD-10-CM | POA: Diagnosis not present

## 2022-03-28 DIAGNOSIS — M25642 Stiffness of left hand, not elsewhere classified: Secondary | ICD-10-CM | POA: Diagnosis not present

## 2022-04-11 ENCOUNTER — Other Ambulatory Visit: Payer: Self-pay | Admitting: Family Medicine

## 2022-04-11 MED ORDER — OXYCODONE HCL 5 MG PO TABS: ORAL_TABLET | ORAL | 0 refills | Status: DC

## 2022-04-11 NOTE — Telephone Encounter (Signed)
Requesting: oxycodone Contract: n/a  UDS: n/a  Last Visit: 02/25/22 Next Visit: 05/27/22 Last Refill: 03/14/22 (90,0)  Please Advise. Med pending

## 2022-04-16 ENCOUNTER — Telehealth: Payer: Self-pay

## 2022-04-16 NOTE — Telephone Encounter (Signed)
Pt contacted regarding colon cancer screening, he would prefer to discuss options during next visit on 4/15.

## 2022-04-18 DIAGNOSIS — M25642 Stiffness of left hand, not elsewhere classified: Secondary | ICD-10-CM | POA: Diagnosis not present

## 2022-04-26 ENCOUNTER — Other Ambulatory Visit: Payer: Self-pay

## 2022-04-26 DIAGNOSIS — E139 Other specified diabetes mellitus without complications: Secondary | ICD-10-CM

## 2022-04-26 MED ORDER — FREESTYLE LIBRE 2 SENSOR MISC: 5 refills | Status: DC

## 2022-04-26 MED ORDER — NOVOLOG FLEXPEN 100 UNIT/ML ~~LOC~~ SOPN: PEN_INJECTOR | SUBCUTANEOUS | 1 refills | Status: DC

## 2022-04-26 MED ORDER — LANTUS SOLOSTAR 100 UNIT/ML ~~LOC~~ SOPN: 20.0000 [IU] | PEN_INJECTOR | Freq: Every day | SUBCUTANEOUS | 1 refills | Status: DC

## 2022-04-26 NOTE — Telephone Encounter (Signed)
Patient currently has no insurance. Needs refill on meds.    Patient has coupons to use at [facility]    Continuous Blood Gluc Sensor (FREESTYLE LIBRE 2 SENSOR) MISC   insulin aspart (NOVOLOG FLEXPEN) 100 UNIT/ML FlexPen    insulin glargine-yfgn (SEMGLEE, YFGN,) 100 UNIT/ML Pen **will need this medication changed to Lantus due to cash price cost of medication, no coupons.   Change preferred pharmacy to [facility]

## 2022-04-26 NOTE — Telephone Encounter (Signed)
Please advise on refills and med change request.

## 2022-04-26 NOTE — Telephone Encounter (Signed)
Pt advised refill sent. °

## 2022-04-26 NOTE — Telephone Encounter (Signed)
OK, lantus rx sent in to replace semglee. Novolog Rf'd.

## 2022-05-06 ENCOUNTER — Other Ambulatory Visit: Payer: Self-pay | Admitting: Family Medicine

## 2022-05-13 ENCOUNTER — Other Ambulatory Visit: Payer: Self-pay | Admitting: Family Medicine

## 2022-05-13 MED ORDER — OXYCODONE HCL 5 MG PO TABS: ORAL_TABLET | ORAL | 0 refills | Status: DC

## 2022-05-13 NOTE — Telephone Encounter (Signed)
Requesting: oxycodone Contract: n/a for this med UDS: n/a for this med Last Visit: 02/25/22 Next Visit: 05/27/22 Last Refill: 04/11/22 (90,0)  Please Advise. Med pending

## 2022-05-13 NOTE — Telephone Encounter (Signed)
Pt advised refill sent. °

## 2022-05-27 ENCOUNTER — Ambulatory Visit: Payer: BC Managed Care – PPO | Admitting: Family Medicine

## 2022-05-30 ENCOUNTER — Ambulatory Visit: Payer: Self-pay | Admitting: Family Medicine

## 2022-06-11 ENCOUNTER — Other Ambulatory Visit: Payer: Self-pay | Admitting: Family Medicine

## 2022-06-11 MED ORDER — OXYCODONE HCL 5 MG PO TABS: ORAL_TABLET | ORAL | 0 refills | Status: DC

## 2022-06-11 NOTE — Telephone Encounter (Signed)
Requesting: oxycodone Contract: n/a for this med UDS: 08/30/21 Last Visit: 02/25/22 Next Visit: 06/17/22 Last Refill: 05/13/22 (90,0)  Please Advise. Med pending

## 2022-06-12 ENCOUNTER — Telehealth: Payer: Self-pay | Admitting: Family Medicine

## 2022-06-12 DIAGNOSIS — E139 Other specified diabetes mellitus without complications: Secondary | ICD-10-CM

## 2022-06-12 MED ORDER — FREESTYLE LIBRE 2 SENSOR MISC
5 refills | Status: DC
Start: 2022-06-12 — End: 2022-12-09

## 2022-06-12 NOTE — Telephone Encounter (Addendum)
New rx sent to pharmacy, please complete PA

## 2022-06-12 NOTE — Telephone Encounter (Signed)
Patient Freestyle Libre 2 sensor was denied and will require a new rx due to a switch in insurance.  Daniel Stevenson states that he has been in contact with [facility] and told him since his insurance is now [employer] he will need a new rx and PA.

## 2022-06-14 NOTE — Patient Instructions (Signed)

## 2022-06-17 ENCOUNTER — Ambulatory Visit (INDEPENDENT_AMBULATORY_CARE_PROVIDER_SITE_OTHER): Payer: 59 | Admitting: Family Medicine

## 2022-06-17 ENCOUNTER — Encounter: Payer: Self-pay | Admitting: Family Medicine

## 2022-06-17 ENCOUNTER — Telehealth: Payer: Self-pay

## 2022-06-17 VITALS — BP 140/90 | HR 94 | Ht 65.0 in | Wt 148.0 lb

## 2022-06-17 DIAGNOSIS — E7800 Pure hypercholesterolemia, unspecified: Secondary | ICD-10-CM | POA: Diagnosis not present

## 2022-06-17 DIAGNOSIS — G894 Chronic pain syndrome: Secondary | ICD-10-CM | POA: Diagnosis not present

## 2022-06-17 DIAGNOSIS — E139 Other specified diabetes mellitus without complications: Secondary | ICD-10-CM | POA: Diagnosis not present

## 2022-06-17 DIAGNOSIS — Z0001 Encounter for general adult medical examination with abnormal findings: Secondary | ICD-10-CM | POA: Diagnosis not present

## 2022-06-17 DIAGNOSIS — Z79899 Other long term (current) drug therapy: Secondary | ICD-10-CM | POA: Diagnosis not present

## 2022-06-17 DIAGNOSIS — R202 Paresthesia of skin: Secondary | ICD-10-CM | POA: Diagnosis not present

## 2022-06-17 DIAGNOSIS — E039 Hypothyroidism, unspecified: Secondary | ICD-10-CM

## 2022-06-17 DIAGNOSIS — I10 Essential (primary) hypertension: Secondary | ICD-10-CM

## 2022-06-17 DIAGNOSIS — R200 Anesthesia of skin: Secondary | ICD-10-CM

## 2022-06-17 DIAGNOSIS — Z0000 Encounter for general adult medical examination without abnormal findings: Secondary | ICD-10-CM

## 2022-06-17 DIAGNOSIS — G459 Transient cerebral ischemic attack, unspecified: Secondary | ICD-10-CM

## 2022-06-17 LAB — COMPREHENSIVE METABOLIC PANEL
ALT: 16 U/L (ref 0–53)
AST: 15 U/L (ref 0–37)
Albumin: 4.1 g/dL (ref 3.5–5.2)
Alkaline Phosphatase: 145 U/L — ABNORMAL HIGH (ref 39–117)
BUN: 8 mg/dL (ref 6–23)
CO2: 30 mEq/L (ref 19–32)
Calcium: 9.4 mg/dL (ref 8.4–10.5)
Chloride: 92 mEq/L — ABNORMAL LOW (ref 96–112)
Creatinine, Ser: 0.85 mg/dL (ref 0.40–1.50)
GFR: 96.51 mL/min (ref >60.00)
Glucose, Bld: 124 mg/dL — ABNORMAL HIGH (ref 70–99)
Potassium: 4.1 mEq/L (ref 3.5–5.1)
Sodium: 131 mEq/L — ABNORMAL LOW (ref 135–145)
Total Bilirubin: 0.8 mg/dL (ref 0.2–1.2)
Total Protein: 6.7 g/dL (ref 6.0–8.3)

## 2022-06-17 LAB — LIPID PANEL
Cholesterol: 105 mg/dL (ref 0–200)
HDL: 57 mg/dL (ref >39.00)
LDL Cholesterol: 38 mg/dL (ref 0–99)
NonHDL: 47.91
Total CHOL/HDL Ratio: 2
Triglycerides: 48 mg/dL (ref 0.0–149.0)
VLDL: 9.6 mg/dL (ref 0.0–40.0)

## 2022-06-17 LAB — CBC
HCT: 44.5 % (ref 39.0–52.0)
Hemoglobin: 15.1 g/dL (ref 13.0–17.0)
MCHC: 34 g/dL (ref 30.0–36.0)
MCV: 90.8 fl (ref 78.0–100.0)
Platelets: 293 10*3/uL (ref 150.0–400.0)
RBC: 4.91 Mil/uL (ref 4.22–5.81)
RDW: 13.7 % (ref 11.5–15.5)
WBC: 8.6 10*3/uL (ref 4.0–10.5)

## 2022-06-17 LAB — POCT GLYCOSYLATED HEMOGLOBIN (HGB A1C)
HbA1c POC (<> result, manual entry): 6.9 % (ref 4.0–5.6)
HbA1c, POC (controlled diabetic range): 6.9 % (ref 0.0–7.0)
HbA1c, POC (prediabetic range): 6.9 % — AB (ref 5.7–6.4)
Hemoglobin A1C: 6.9 % — AB (ref 4.0–5.6)

## 2022-06-17 LAB — MICROALBUMIN / CREATININE URINE RATIO
Creatinine,U: 104.9 mg/dL
Microalb Creat Ratio: 6.2 mg/g (ref 0.0–30.0)
Microalb, Ur: 6.5 mg/dL — ABNORMAL HIGH (ref 0.0–1.9)

## 2022-06-17 LAB — TSH: TSH: 0.01 u[IU]/mL — ABNORMAL LOW (ref 0.35–5.50)

## 2022-06-17 MED ORDER — METOPROLOL SUCCINATE ER 25 MG PO TB24: 25.0000 mg | ORAL_TABLET | Freq: Every day | ORAL | 0 refills | Status: DC

## 2022-06-17 NOTE — Progress Notes (Signed)
Office Note 06/17/2022  CC:  Chief Complaint  Patient presents with   Annual Exam    Fasting CPE.   Patient is a 57 y.o. male who is here for annual health maintenance exam and 79-month follow-up diabetes, hypothyroidism, and chronic pain syndrome. A/P as of last visit: "#1 chronic pain syndrome: Bilateral wrist and hand osteoarthritis.  He is status post bilateral carpal tunnel surgery last year and 1 month ago had left wrist/thumb surgery.  He has improved. Hopefully he can minimize oxycodone even more over time. At the time being he seems to be doing okay on 2-3 oxycodone per day.  Okay to continue meloxicam daily and Tylenol couple times a day. Monitoring renal function. At this point they do not have plans to operate on his right wrist/hand unless his pain persistently worsens.   #2 LADA.  20 units of Semglee per day and 0 to 10 units NovoLog before every meal. Hemoglobin A1c today. I am managing this problem now, patient says co-pay with specialist is too high.   3.  Hypoth: Takes T4 on empty stomach w/out any other meds ( tabs, 1 and 1/2 qd M/W/F, 1 qd all other days). TSH today.   #4 hypertension, well-controlled on losartan 100 mg a day. Electrolytes and creatinine today."  INTERIM HX: Gaynelle Adu has been having intermittent tingling of the right side of his face (including auricular and retroauricular areas) and right side of neck.  Has been occurring for about the last 6 months.  Happens several times a day now.  Describes a feeling similar to when you are feet go to sleep. Denies facial droop, facial pain, speech difficulty, vision abnormality, or focal limb weakness.  Glucoses have been good: Currently taking 20 units of Lantus daily and 5 to 7 units NovoLog before every meal.  Unfortunately Gaynelle Adu lost his job.  He had been out due to his significant hand arthritis and was not going to be able to return to do the same job---he was a Engineer, manufacturing.  The  chronic pain in his wrists and hands has not improved much.  He still requires three 5 mg oxycodone tabs a day typically. PMP AWARE reviewed today: most recent rx for oxycodone was filled 06/11/2022, # 90, rx by me. No red flags.  Past Medical History:  Diagnosis Date   Back pain    s/p back surgery   Carpal tunnel syndrome on both sides    Chronic hyponatremia 08/2012 onset   Onset was right after starting amitriptyline for his neuropathic leg pain--has been stable.  Likely ADH-like effect on kidney from amitriptyline.   COVID-19 virus infection 09/03/2020   History of low back pain 06/24/2011   Hyperlipidemia    Hypothyroidism    LADA (latent autoimmune diabetes in adults), managed as type 1 (HCC) Dx'd 2014   Dr. Morrison Old.   Migraines    Neuropathic pain of right lower extremity    Osteoarthritis of hands, bilateral    Osteoarthritis of wrists, bilateral    PLANTAR WART, RIGHT 10/09/2009   Snoring    Sleep study neg  OSA 04/2018   Tobacco dependence    quit 2012; restarted not long after    Past Surgical History:  Procedure Laterality Date   BACK SURGERY  02/12/2007   lower lumbar fusion- rubber disc in back   CARDIOVASCULAR STRESS TEST  03/21/2014   No ischemia/low risk study; however, LV function showed EF 49% and global LV hypokinesis--echo recommended and it showed normal LV  fxn.   CARPAL TUNNEL RELEASE     bilat 2023/24   NASAL SINUS SURGERY  02/11/2005   Polysomnogram  04/2018   NO OSA (Dr. Frances Furbish)   TRANSTHORACIC ECHOCARDIOGRAM  03/24/2014   Normal LV fxn, EF 50-55%, no wall motion abnormalities: (grade 1 diast dysfxn)   WRIST SURGERY     2023/24 L wrist CMC arthroplasty    Family History  Problem Relation Age of Onset   Emphysema Mother        smoker   Obesity Father     Social History   Socioeconomic History   Marital status: Married    Spouse name: Not on file   Number of children: Not on file   Years of education: Not on file   Highest education  level: 12th grade  Occupational History   Not on file  Tobacco Use   Smoking status: Every Day    Packs/day: 1.00    Years: 35.00    Additional pack years: 0.00    Total pack years: 35.00    Types: Cigarettes    Last attempt to quit: 12/19/2010    Years since quitting: 11.5   Smokeless tobacco: Never  Vaping Use   Vaping Use: Never used  Substance and Sexual Activity   Alcohol use: Yes    Comment: rarely   Drug use: No   Sexual activity: Not on file  Other Topics Concern   Not on file  Social History Narrative   Married, 1 biologic child, 3 step children (all grown).   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   Smoker: 1-2 ppd x 17 yrs.  Rare ETOH, no drugs.   No exercise.         Social Determinants of Health   Financial Resource Strain: Low Risk  (01/14/2022)   Overall Financial Resource Strain (CARDIA)    Difficulty of Paying Living Expenses: Not hard at all  Food Insecurity: No Food Insecurity (01/14/2022)   Hunger Vital Sign    Worried About Running Out of Food in the Last Year: Never true    Ran Out of Food in the Last Year: Never true  Transportation Needs: No Transportation Needs (01/14/2022)   PRAPARE - Administrator, Civil Service (Medical): No    Lack of Transportation (Non-Medical): No  Physical Activity: Unknown (01/14/2022)   Exercise Vital Sign    Days of Exercise per Week: Patient declined    Minutes of Exercise per Session: Not on file  Stress: No Stress Concern Present (01/14/2022)   [employer] of Occupational Health - Occupational Stress Questionnaire    Feeling of Stress : Only a little  Social Connections: Moderately Isolated (01/14/2022)   Social Connection and Isolation Panel [NHANES]    Frequency of Communication with Friends and Family: More than three times a week    Frequency of Social Gatherings with Friends and Family: Twice a week    Attends Religious Services: Never    Doctor, general practice or Organizations: No    Attends Engineer, structural: Not on file    Marital Status: Married  Catering manager Violence: Not on file    Outpatient Medications Prior to Visit  Medication Sig Dispense Refill   amitriptyline (ELAVIL) 25 MG tablet TAKE ONE TABLET BY MOUTH EVERY MORNING AND TAKE THREE TABLETS AT BEDTIME for leg pain 120 tablet 0   atorvastatin (LIPITOR) 20 MG tablet Take 1 tablet (20 mg total) by mouth daily.  90 tablet 3   buPROPion (WELLBUTRIN XL) 150 MG 24 hr tablet Take 1 tablet (150 mg total) by mouth daily. 90 tablet 3   citalopram (CELEXA) 40 MG tablet Take 1 tablet (40 mg total) by mouth daily. 90 tablet 3   Continuous Glucose Sensor (FREESTYLE LIBRE 2 SENSOR) MISC APPLY ROUTE EVERY 14 DAYS. 2 each 5   fluticasone (FLONASE) 50 MCG/ACT nasal spray Place 2 sprays into both nostrils daily. 16 g 3   insulin aspart (NOVOLOG FLEXPEN) 100 UNIT/ML FlexPen INJECT 15 UNITS NINTO THE SKIN BEFORE EVERY A MEAL 15 mL 1   insulin glargine (LANTUS SOLOSTAR) 100 UNIT/ML Solostar Pen Inject 20 Units into the skin daily. 15 mL 1   Insulin Pen Needle (SURE COMFORT PEN NEEDLES) 32G X 4 MM MISC USE FOUR TIMES DAILY AS NEEDED 100 each 10   levothyroxine (SYNTHROID) 137 MCG tablet Take 1-1/2 tabs on Mondays Wednesdays and Fridays and 1 tab all other days. 96 tablet 0   losartan (COZAAR) 100 MG tablet Take 1 tablet (100 mg total) by mouth daily. 90 tablet 3   meloxicam (MOBIC) 15 MG tablet Take 1 tablet (15 mg total) by mouth daily. 30 tablet 5   oxyCODONE (OXY IR/ROXICODONE) 5 MG immediate release tablet 1-2 tabs po qid prn 90 tablet 0   pantoprazole (PROTONIX) 40 MG tablet Take 1 tablet (40 mg total) by mouth daily. 90 tablet 3   sildenafil (VIAGRA) 100 MG tablet Take 1 tablet (100 mg total) by mouth daily as needed for erectile dysfunction. 10 tablet 11   tamsulosin (FLOMAX) 0.4 MG CAPS capsule Take 1 capsule (0.4 mg total) by mouth daily after supper. 90 capsule 3   No  facility-administered medications prior to visit.    Allergies  Allergen Reactions   Amlodipine Other (See Comments)    Headache   Augmentin [Amoxicillin-Pot Clavulanate] Nausea And Vomiting   Oxycodone-Acetaminophen     REACTION: itching   Levothyroxine Rash    Had rash associated with generic levothyroxine, but not brand-name Synthroid.  Likely allergic to a dye or filler in the generic preparation.   Lisinopril Cough    Review of Systems  Constitutional:  Negative for appetite change, chills, fatigue and fever.  HENT:  Negative for congestion, dental problem, ear pain and sore throat.   Eyes:  Negative for discharge, redness and visual disturbance.  Respiratory:  Negative for cough, chest tightness, shortness of breath and wheezing.   Cardiovascular:  Negative for chest pain, palpitations and leg swelling.  Gastrointestinal:  Negative for abdominal pain, blood in stool, diarrhea, nausea and vomiting.  Genitourinary:  Negative for difficulty urinating, dysuria, flank pain, frequency, hematuria and urgency.  Musculoskeletal:  Positive for arthralgias (chronic bilat hands). Negative for back pain, joint swelling, myalgias and neck stiffness.  Skin:  Negative for pallor and rash.  Neurological:  Positive for numbness and headaches (frontal--stable, chronic/recurrent). Negative for dizziness, speech difficulty and weakness.  Hematological:  Negative for adenopathy. Does not bruise/bleed easily.  Psychiatric/Behavioral:  Negative for confusion and sleep disturbance. The patient is not nervous/anxious.     PE;    06/17/2022   10:24 AM 06/17/2022    9:54 AM 02/25/2022    1:06 PM  Vitals with BMI  Height  5\' 5"  5\' 4"   Weight  148 lbs 154 lbs 6 oz  BMI  24.63 26.49  Systolic 140 157 161  Diastolic 90 108 74  Pulse  94 97   Gen: Alert, well appearing.  Patient is oriented to person, place, time, and situation. AFFECT: pleasant, lucid thought and speech. ENT: Ears: EACs clear, normal  epithelium.  TMs with good light reflex and landmarks bilaterally.  Eyes: no injection, icteris, swelling, or exudate.  EOMI, PERRLA. Nose: no drainage or turbinate edema/swelling.  No injection or focal lesion.  Mouth: lips without lesion/swelling.  Oral mucosa pink and moist.  Dentition intact and without obvious caries or gingival swelling.  Oropharynx without erythema, exudate, or swelling.  Neck: supple/nontender.  No LAD, mass, or TM.  Carotid pulses 2+ bilaterally, without bruits. CV: RRR, no m/r/g.   LUNGS: CTA bilat, nonlabored resps, good aeration in all lung fields. ABD: soft, NT, ND, BS normal.  No hepatospenomegaly or mass.  No bruits. EXT: no clubbing, cyanosis, or edema.  Musculoskeletal: no joint swelling, erythema, warmth, or tenderness.  ROM of all joints intact. Skin - no sores or suspicious lesions or rashes or color changes Neuro: CN 2-12 intact bilaterally, strength 5/5 in proximal and distal upper extremities and lower extremities bilaterally.  No sensory deficits.  No tremor.  No disdiadochokinesis.  No ataxia.  Upper extremity and lower extremity DTRs symmetric.  No pronator drift.  Pertinent labs:  Lab Results  Component Value Date   TSH 0.01 (L) 06/17/2022   Lab Results  Component Value Date   WBC 8.6 06/17/2022   HGB 15.1 06/17/2022   HCT 44.5 06/17/2022   MCV 90.8 06/17/2022   PLT 293.0 06/17/2022   Lab Results  Component Value Date   CREATININE 0.85 06/17/2022   BUN 8 06/17/2022   NA 131 (L) 06/17/2022   K 4.1 06/17/2022   CL 92 (L) 06/17/2022   CO2 30 06/17/2022   Lab Results  Component Value Date   ALT 16 06/17/2022   AST 15 06/17/2022   ALKPHOS 145 (H) 06/17/2022   BILITOT 0.8 06/17/2022   Lab Results  Component Value Date   CHOL 105 06/17/2022   Lab Results  Component Value Date   HDL 57.00 06/17/2022   Lab Results  Component Value Date   LDLCALC 38 06/17/2022   Lab Results  Component Value Date   TRIG 48.0 06/17/2022   Lab  Results  Component Value Date   CHOLHDL 2 06/17/2022   Lab Results  Component Value Date   PSA 0.29 05/03/2020   PSA 0.23 12/24/2018   PSA 0.23 11/18/2017   Lab Results  Component Value Date   HGBA1C 6.9 (A) 06/17/2022   HGBA1C 6.9 06/17/2022   HGBA1C 6.9 (A) 06/17/2022   HGBA1C 6.9 06/17/2022   ASSESSMENT AND PLAN:   #1 health maintenance exam: Reviewed age and gender appropriate health maintenance issues (prudent diet, regular exercise, health risks of tobacco and excessive alcohol, use of seatbelts, fire alarms in home, use of sunscreen).  Also reviewed age and gender appropriate health screening as well as vaccine recommendations. Vaccines: Shingrix->declines for now.  Otherwise ALL UTD. Labs: cbc, cmet, flp, a1c. Prostate ca screening: PSA ordered. Colon ca screening: pt has not had screening yet-> not addressed today.  #2 LADA, managed as type I. Good control. POC Hba1c today is 6.9%. Urine microalbumin/creatinine today. Continue 20 units Lantus daily and 5 to 7 units NovoLog before every meal.  3.  Hypertension, uncontrolled. Add Toprol-XL 25 mg a day.  Continue losartan 100 mg a day. Electrolytes and creatinine today.  4.  Chronic pain syndrome: Bilateral wrist and hands arthritis. He has had fusion of left CMC joint as well as  bilateral carpal tunnel release. Stable pain.  At this time we will continue oxycodone 5 mg 1-2 4 times daily as needed, #90 per month--> a new prescription was not needed today.  5.  Paresthesias: Subacute.  Right side of face and neck. Question TIA.  Also r/o brain mass, aneurism, or demyelinating dz. Check MRI brain with and without contrast, MRA neck with and without contrast, and MRA brain without contrast.  An After Visit Summary was printed and given to the patient.  FOLLOW UP:  Return in about 2 weeks (around 07/01/2022) for f/u bp and facial paresthesia.  Signed:  Santiago Bumpers, MD           06/17/2022

## 2022-06-17 NOTE — Telephone Encounter (Signed)
PA started, waiting for determination

## 2022-06-17 NOTE — Telephone Encounter (Signed)
Patient said pharmacy was sending over information regarding prior auth for Libre  Continuous Glucose Sensor (FREESTYLE LIBRE 2 SENSOR) MISC   Please follow up on status for patient

## 2022-06-17 NOTE — Telephone Encounter (Signed)
Benjamin Stain (KeyLorelee New) PA Case ID #: [id number] Rx #: [id number]  PA sent 06/17/22

## 2022-06-18 NOTE — Telephone Encounter (Signed)
On 06/17/2022, we received a request from your prescriber for coverage of Freestyle Libre 2 (All Products) for you. We reviewed the request for medical necessity and have determined it meets established medical criteria. This letter confirms our approval for the requested drug. Your authorization number is: [facility] [id number] Ellard Artis As long as you remain covered by your prescription drug plan and there are no changes to your plan benefits, this request is approved for the following time period: 06/18/2022 - 06/18/2023

## 2022-06-19 ENCOUNTER — Other Ambulatory Visit (HOSPITAL_COMMUNITY): Payer: Self-pay

## 2022-06-24 ENCOUNTER — Ambulatory Visit: Payer: 59 | Attending: Orthopedic Surgery | Admitting: Occupational Therapy

## 2022-06-24 ENCOUNTER — Encounter: Payer: Self-pay | Admitting: Occupational Therapy

## 2022-06-24 DIAGNOSIS — M79644 Pain in right finger(s): Secondary | ICD-10-CM | POA: Insufficient documentation

## 2022-06-24 DIAGNOSIS — M25542 Pain in joints of left hand: Secondary | ICD-10-CM | POA: Insufficient documentation

## 2022-06-24 DIAGNOSIS — M6281 Muscle weakness (generalized): Secondary | ICD-10-CM | POA: Insufficient documentation

## 2022-06-24 DIAGNOSIS — M25541 Pain in joints of right hand: Secondary | ICD-10-CM | POA: Insufficient documentation

## 2022-06-24 DIAGNOSIS — M25642 Stiffness of left hand, not elsewhere classified: Secondary | ICD-10-CM | POA: Diagnosis present

## 2022-06-24 NOTE — Therapy (Signed)
OUTPATIENT OCCUPATIONAL THERAPY ORTHO EVALUATION  Patient Name: Daniel Stevenson MRN: [id number] DOB:1965-09-14, 57 y.o., male Today's Date: 06/24/2022  PCP: Jeoffrey Massed REFERRING PROVIDER: Gomez Cleverly, MD  END OF SESSION:  OT End of Session - 06/24/22 1314     Visit Number 1    Number of Visits 9    Date for OT Re-Evaluation 08/24/22    Authorization Type Jari Favre MCD - requires auth after 5th visit    Authorization - Visit Number 1    Authorization - Number of Visits 5    OT Start Time 1230    OT Stop Time 1315    OT Time Calculation (min) 45 min    Activity Tolerance Patient tolerated treatment well    Behavior During Therapy [facility] for tasks assessed/performed             Past Medical History:  Diagnosis Date   Back pain    s/p back surgery   Carpal tunnel syndrome on both sides    Chronic hyponatremia 08/2012 onset   Onset was right after starting amitriptyline for his neuropathic leg pain--has been stable.  Likely ADH-like effect on kidney from amitriptyline.   COVID-19 virus infection 09/03/2020   History of low back pain 06/24/2011   Hyperlipidemia    Hypothyroidism    LADA (latent autoimmune diabetes in adults), managed as type 1 (HCC) Dx'd 2014   Dr. Morrison Old.   Migraines    Neuropathic pain of right lower extremity    Osteoarthritis of hands, bilateral    Osteoarthritis of wrists, bilateral    PLANTAR WART, RIGHT 10/09/2009   Snoring    Sleep study neg  OSA 04/2018   Tobacco dependence    quit 2012; restarted not long after   Past Surgical History:  Procedure Laterality Date   BACK SURGERY  02/12/2007   lower lumbar fusion- rubber disc in back   CARDIOVASCULAR STRESS TEST  03/21/2014   No ischemia/low risk study; however, LV function showed EF 49% and global LV hypokinesis--echo recommended and it showed normal LV fxn.   CARPAL TUNNEL RELEASE     bilat 2023/24   NASAL SINUS SURGERY  02/11/2005   Polysomnogram  04/2018   NO OSA (Dr. Frances Furbish)    TRANSTHORACIC ECHOCARDIOGRAM  03/24/2014   Normal LV fxn, EF 50-55%, no wall motion abnormalities: (grade 1 diast dysfxn)   WRIST SURGERY     2023/24 L wrist [facility] arthroplasty   Patient Active Problem List   Diagnosis Date Noted   Carpal tunnel syndrome of left wrist 08/13/2021   Other symptoms and signs involving cognitive functions and awareness 06/30/2019   Lymphadenitis, acute 05/02/2015   Headache 01/10/2015   Fatigue 01/10/2015   Allergic rhinitis 01/10/2015   Medication intolerance 09/06/2013   Health maintenance examination 07/13/2013   Type II or unspecified type diabetes mellitus without mention of complication, uncontrolled 01/18/2013   GERD (gastroesophageal reflux disease) 01/18/2013   Right leg pain 08/18/2012   Ganglion cyst of flexor tendon sheath 08/05/2011   History of low back pain 06/24/2011   Tobacco dependence 11/20/2010   Hypothyroidism 10/09/2009    ONSET DATE: 06/18/2022  REFERRING DIAG: M18.12 (ICD-10-CM) - Unilateral primary osteoarthritis of first carpometacarpal joint, left hand  THERAPY DIAG:  Muscle weakness (generalized)  Pain in thumb joint with movement of left hand  Pain in thumb joint with movement of right hand  Pain in right finger(s)  Stiffness of left hand, not elsewhere classified  Rationale for Evaluation and  Treatment: Rehabilitation  SUBJECTIVE:   SUBJECTIVE STATEMENT: My Rt thumb is bothering me but my Lt thumb is still weak. I probably need surgery to my Rt thumb too. I also trigger Rt long finger Pt accompanied by: self  PERTINENT HISTORY: DMT1, hypothyroidism, and chronic pain syndrome. Status post bilateral carpal tunnel surgery 08/2021 and 11/2021, s/p Lt [facility] arthroplasty 01/2022  PRECAUTIONS: Other: previous back surgeries  WEIGHT BEARING RESTRICTIONS: No  PAIN:  Are you having pain? Yes: NPRS scale: 4/10 Pain location: Lt thumb, Rt thumb 8/10 Pain description: achy Aggravating factors: movement Relieving  factors: hot water  FALLS: Has patient fallen in last 6 months? 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 , and Leisure: fishing, playing with grandchildren  PATIENT GOALS: strengthen Lt thumb, decrease pain/symptoms Rt thumb  NEXT MD VISIT: none  OBJECTIVE:   HAND DOMINANCE: Right  ADLs:  Eating: difficulty cutting food Grooming: mod I  UB Dressing: mod I w/ pullover shirts LB Dressing: difficulty buttoning pants, getting laces tight Toileting: mod I Bathing: mod I  Tub Shower transfers: independent **Difficulty giving himself insulin, opening jars/tight containers, and pants buttons  FUNCTIONAL OUTCOME MEASURES: Quick Dash: 61% disabled in hands  UPPER EXTREMITY ROM:   BUE AROM WNL's except thumbs - opposes to 4th digit only bilaterally, thumb MP flexion limited bilaterally (Lt worse than Rt)    Active ROM Right eval Left eval  Thumb MCP (0-60) 30* 15*  Thumb IP (0-80)    Thumb Radial abd/add (0-55) 75 60   Thumb Palmar abd/add (0-45) 65 60   Thumb Opposition to Small Finger     Index MCP (0-90)     Index PIP (0-100)     Index DIP (0-70)      Long MCP (0-90)      Long PIP (0-100)      Long DIP (0-70)      Ring MCP (0-90)      Ring PIP (0-100)      Ring DIP (0-70)      Little MCP (0-90)      Little PIP (0-100)      Little DIP (0-70)      (Blank rows = not tested)    HAND FUNCTION: Grip strength: Right: 44.5 lbs; Left: 18.5 lbs,  Lateral pinch: Right: 12 lbs, Left: 7 lbs, and  3 point pinch: Right: 6 lbs, Left: 5 lbs  COORDINATION: 9 Hole Peg test: Right: 20.97 sec; Left: 21 sec  SENSATION: Pt reports Rt LF goes numb and swells up at times  EDEMA: very minimal Rt fingers  COGNITION: Overall cognitive status: Within functional limits for tasks  assessed  OBSERVATIONS: Pt w/ more limited Lt thumb ROM d/t CMC arthroplasty, however Rt thumb has begun bothering him   TODAY'S TREATMENT:  N/A today - eval only   PATIENT EDUCATION: Education details: OT POC Person educated: Patient Education method: Explanation Education comprehension: verbalized understanding  HOME EXERCISE PROGRAM: N/A today  GOALS: Goals reviewed with patient? Yes  SHORT TERM GOALS: Target date: 07/25/22  Independent with HEP for bilateral grip and pinch strength Baseline: Goal status: INITIAL  2.  Pt to verbalize understanding with pain management strategies including use of modalities, joint protection strategies, task modifications, and splinting needs for bilateral thumbs Baseline:  Goal status: INITIAL  3.  Pt to verbalize understanding with potential A/E needs like jar openers, button hook, and for cutting food, etc.  Baseline:  Goal status: INITIAL  4.  Pt will verbalize understanding to minimize symptoms of Rt long finger trigger finger Baseline:  Goal status: INITIAL    LONG TERM GOALS: Target date: 08/24/22  Pt will improve grip strength Lt hand to 25 lbs to open jars/containers w/ greater ease Baseline: 18.5 lbs Goal status: INITIAL  2.  Pt to improve Lt lateral pinch to 9 lbs or greater for functional pinching Baseline: 7 lbs Goal status: INITIAL  3.  Pt to improve bilateral 3 tip pinch by 2 lbs or greater  Baseline:  Rt = 6 lbs, Lt = 5 lbs Goal status: INITIAL  4.  Pt to improve overall function as evidenced by scoring 50% or less on Quick Dash  Baseline: 61% Goal status: INITIAL    ASSESSMENT:  CLINICAL IMPRESSION: Patient is a 57 y.o. male who was seen today for occupational therapy evaluation for OA CMC of Lt thumb, s/p Lt [facility] arthroplasty Dec 2023 w/ residual weakness. Pt also has pain in Rt  thumb CMC joint and Rt long finger triggering. Pt would benefit from skilled O.T. to address these deficits, decrease pain, and increase strength and function in bilateral hands.   PERFORMANCE DEFICITS: in functional skills including ADLs, IADLs, sensation, edema, ROM, strength, pain, body mechanics, decreased knowledge of use of DME, and UE functional use,.   IMPAIRMENTS: are limiting patient from ADLs, IADLs, work, and leisure.   COMORBIDITIES: may have co-morbidities  that affects occupational performance. Patient will benefit from skilled OT to address above impairments and improve overall function.  MODIFICATION OR ASSISTANCE TO COMPLETE EVALUATION: No modification of tasks or assist necessary to complete an evaluation.  OT OCCUPATIONAL PROFILE AND HISTORY: Problem focused assessment: Including review of records relating to presenting problem.  CLINICAL DECISION MAKING: Moderate - several treatment options, min-mod task modification necessary  REHAB POTENTIAL: Good  EVALUATION COMPLEXITY: Low      PLAN:  OT FREQUENCY: 1x/week  OT DURATION: 8 weeks (plus eval)  PLANNED INTERVENTIONS: self care/ADL training, therapeutic exercise, therapeutic activity, manual therapy, passive range of motion, splinting, ultrasound, paraffin, fluidotherapy, moist heat, cryotherapy, contrast bath, patient/family education, coping strategies training, and DME and/or AE instructions  RECOMMENDED OTHER SERVICES: none at this time  CONSULTED AND AGREED WITH PLAN OF CARE: Patient  PLAN FOR NEXT SESSION: fluido or paraffin, putty HEP for grip and pinch strength (red for Rt, yellow for Lt), A/E needs   Sheran Lawless, OT 06/24/2022, 1:19 PM

## 2022-07-04 ENCOUNTER — Ambulatory Visit: Payer: 59 | Admitting: Occupational Therapy

## 2022-07-04 ENCOUNTER — Encounter: Payer: Self-pay | Admitting: Occupational Therapy

## 2022-07-04 DIAGNOSIS — M6281 Muscle weakness (generalized): Secondary | ICD-10-CM | POA: Diagnosis not present

## 2022-07-04 DIAGNOSIS — M25542 Pain in joints of left hand: Secondary | ICD-10-CM

## 2022-07-04 DIAGNOSIS — M25642 Stiffness of left hand, not elsewhere classified: Secondary | ICD-10-CM

## 2022-07-04 NOTE — Patient Instructions (Signed)
Opposition (Active)   Touch tip of thumb to nail tip of each finger in turn, making an "O" shape. Repeat __10__ times. Do _4___ sessions per day.   AROM: Thumb Abduction / Adduction    Actively pull right thumb away from palm as far as possible.  Then bring thumb back to touch fingers. Try not to bend fingers toward thumb. Repeat _10-15___ times per set.  Do __4__ sessions per day.    MP Flexion (Active)   Bend thumb to touch base of little finger, keeping tip joint straight. Repeat __10-15__ times. Do _4___ sessions per day   1. Grip Strengthening (Resistive Putty)   Squeeze putty using thumb and all fingers. Repeat _15___ times. Do __2__ sessions per day.   2. Roll putty into tube on table and pinch between first two fingers and thumb x 10 reps. Do 2 sessions per day  3. Lateral Pinch Strengthening (Resistive Putty)    Squeeze between thumb and side of each finger in turn. Repeat _10___ times. Do _2___ sessions per day.

## 2022-07-04 NOTE — Therapy (Signed)
OUTPATIENT OCCUPATIONAL THERAPY ORTHO TREATMENT  Patient Name: Daniel Stevenson MRN: [id number] DOB:1965/10/08, 57 y.o., male Today's Date: 07/04/2022  PCP: Jeoffrey Massed REFERRING PROVIDER: Gomez Cleverly, MD  END OF SESSION:  OT End of Session - 07/04/22 0907     Visit Number 2    Number of Visits 9    Date for OT Re-Evaluation 08/24/22    Authorization Type Jari Favre MCD - requires auth after 5th visit    Authorization - Visit Number 2    Authorization - Number of Visits 5    OT Start Time 0845    OT Stop Time 0930    OT Time Calculation (min) 45 min    Activity Tolerance Patient tolerated treatment well    Behavior During Therapy [facility] for tasks assessed/performed             Past Medical History:  Diagnosis Date   Back pain    s/p back surgery   Carpal tunnel syndrome on both sides    Chronic hyponatremia 08/2012 onset   Onset was right after starting amitriptyline for his neuropathic leg pain--has been stable.  Likely ADH-like effect on kidney from amitriptyline.   COVID-19 virus infection 09/03/2020   History of low back pain 06/24/2011   Hyperlipidemia    Hypothyroidism    LADA (latent autoimmune diabetes in adults), managed as type 1 (HCC) Dx'd 2014   Dr. Morrison Old.   Migraines    Neuropathic pain of right lower extremity    Osteoarthritis of hands, bilateral    Osteoarthritis of wrists, bilateral    PLANTAR WART, RIGHT 10/09/2009   Snoring    Sleep study neg  OSA 04/2018   Tobacco dependence    quit 2012; restarted not long after   Past Surgical History:  Procedure Laterality Date   BACK SURGERY  02/12/2007   lower lumbar fusion- rubber disc in back   CARDIOVASCULAR STRESS TEST  03/21/2014   No ischemia/low risk study; however, LV function showed EF 49% and global LV hypokinesis--echo recommended and it showed normal LV fxn.   CARPAL TUNNEL RELEASE     bilat 2023/24   NASAL SINUS SURGERY  02/11/2005   Polysomnogram  04/2018   NO OSA (Dr. Frances Furbish)    TRANSTHORACIC ECHOCARDIOGRAM  03/24/2014   Normal LV fxn, EF 50-55%, no wall motion abnormalities: (grade 1 diast dysfxn)   WRIST SURGERY     2023/24 L wrist [facility] arthroplasty   Patient Active Problem List   Diagnosis Date Noted   Carpal tunnel syndrome of left wrist 08/13/2021   Other symptoms and signs involving cognitive functions and awareness 06/30/2019   Lymphadenitis, acute 05/02/2015   Headache 01/10/2015   Fatigue 01/10/2015   Allergic rhinitis 01/10/2015   Medication intolerance 09/06/2013   Health maintenance examination 07/13/2013   Type II or unspecified type diabetes mellitus without mention of complication, uncontrolled 01/18/2013   GERD (gastroesophageal reflux disease) 01/18/2013   Right leg pain 08/18/2012   Ganglion cyst of flexor tendon sheath 08/05/2011   History of low back pain 06/24/2011   Tobacco dependence 11/20/2010   Hypothyroidism 10/09/2009    ONSET DATE: 06/18/2022  REFERRING DIAG: M18.12 (ICD-10-CM) - Unilateral primary osteoarthritis of first carpometacarpal joint, left hand  THERAPY DIAG:  Pain in thumb joint with movement of left hand  Muscle weakness (generalized)  Stiffness of left hand, not elsewhere classified  Rationale for Evaluation and Treatment: Rehabilitation  SUBJECTIVE:   SUBJECTIVE STATEMENT: My Rt thumb is bothering me but  my Lt thumb is still weak. I probably need surgery to my Rt thumb too. I also trigger Rt long finger Pt accompanied by: self  PERTINENT HISTORY: DMT1, hypothyroidism, and chronic pain syndrome. Status post bilateral carpal tunnel surgery 08/2021 and 11/2021, s/p Lt [facility] arthroplasty 01/2022  PRECAUTIONS: Other: previous back surgeries  WEIGHT BEARING RESTRICTIONS: No  PAIN:  Are you having pain? Yes: NPRS scale: 4/10 Pain location: Lt thumb, Rt thumb 6/10 Pain description: achy Aggravating factors: movement Relieving factors: hot water  FALLS: Has patient fallen in last 6 months? 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 , and Leisure: fishing, playing with grandchildren  PATIENT GOALS: strengthen Lt thumb, decrease pain/symptoms Rt thumb  NEXT MD VISIT: none  OBJECTIVE:   HAND DOMINANCE: Right  ADLs:  Eating: difficulty cutting food Grooming: mod I  UB Dressing: mod I w/ pullover shirts LB Dressing: difficulty buttoning pants, getting laces tight Toileting: mod I Bathing: mod I  Tub Shower transfers: independent **Difficulty giving himself insulin, opening jars/tight containers, and pants buttons  FUNCTIONAL OUTCOME MEASURES: Quick Dash: 61% disabled in hands  UPPER EXTREMITY ROM:   BUE AROM WNL's except thumbs - opposes to 4th digit only bilaterally, thumb MP flexion limited bilaterally (Lt worse than Rt)    Active ROM Right eval Left eval  Thumb MCP (0-60) 30* 15*  Thumb IP (0-80)    Thumb Radial abd/add (0-55) 75 60   Thumb Palmar abd/add (0-45) 65 60   Thumb Opposition to Small Finger     Index MCP (0-90)     Index PIP (0-100)     Index DIP (0-70)      Long MCP (0-90)      Long PIP (0-100)      Long DIP (0-70)      Ring MCP (0-90)      Ring PIP (0-100)      Ring DIP (0-70)      Little MCP (0-90)      Little PIP (0-100)      Little DIP (0-70)      (Blank rows = not tested)    HAND FUNCTION: Grip strength: Right: 44.5 lbs; Left: 18.5 lbs,  Lateral pinch: Right: 12 lbs, Left: 7 lbs, and  3 point pinch: Right: 6 lbs, Left: 5 lbs  COORDINATION: 9 Hole Peg test: Right: 20.97 sec; Left: 21 sec  SENSATION: Pt reports Rt LF goes numb and swells up at times  EDEMA: very minimal Rt fingers  COGNITION: Overall cognitive status: Within functional limits for tasks assessed  OBSERVATIONS: Pt w/ more limited Lt thumb ROM d/t [facility] arthroplasty,  however Rt thumb has begun bothering him   TODAY'S TREATMENT:  Assessed current splints for Lt thumb which still work and fit well.  Sized pt for neoprene splint for Rt thumb and provided handout on neoprene splint for Rt thumb and where to purchase  Fluidotherapy x 12 min Lt hand (vs both per pt request) to decrease pain/stiffness.   Pt issued A/ROM and strengthening HEP for bilateral thumbs - see pt instructions for details. Pt issued yellow putty for Lt hand and bilateral pinch strength, red putty for Rt grip only Pt demo each x 10 reps each hand  PATIENT EDUCATION: Education details: A/ROM and putty HEP for bilateral thumbs Person educated: Patient Education method: Explanation Education comprehension: verbalized understanding  HOME EXERCISE PROGRAM: 07/04/22: AROM and putty HEP for bilateral thumbs  GOALS: Goals reviewed with patient? Yes  SHORT TERM GOALS: Target date: 07/25/22  Independent with HEP for bilateral grip and pinch strength Baseline: Goal status: IN PROGRESS  2.  Pt to verbalize understanding with pain management strategies including use of modalities, joint protection strategies, task modifications, and splinting needs for bilateral thumbs Baseline:  Goal status: INITIAL  3.  Pt to verbalize understanding with potential A/E needs like jar openers, button hook, and for cutting food, etc.  Baseline:  Goal status: INITIAL  4.  Pt will verbalize understanding to minimize symptoms of Rt long finger trigger finger Baseline:  Goal status: INITIAL    LONG TERM GOALS: Target date: 08/24/22  Pt will improve grip strength Lt hand to 25 lbs to open jars/containers w/ greater ease Baseline: 18.5 lbs Goal status: INITIAL  2.  Pt to improve Lt lateral pinch to 9 lbs or greater for functional pinching Baseline: 7 lbs Goal status:  INITIAL  3.  Pt to improve bilateral 3 tip pinch by 2 lbs or greater  Baseline:  Rt = 6 lbs, Lt = 5 lbs Goal status: INITIAL  4.  Pt to improve overall function as evidenced by scoring 50% or less on Quick Dash  Baseline: 61% Goal status: INITIAL    ASSESSMENT:  CLINICAL IMPRESSION: Pt tolerating HEP well. Pt w/ no increase in pain today.   PERFORMANCE DEFICITS: in functional skills including ADLs, IADLs, sensation, edema, ROM, strength, pain, body mechanics, decreased knowledge of use of DME, and UE functional use,.   IMPAIRMENTS: are limiting patient from ADLs, IADLs, work, and leisure.   COMORBIDITIES: may have co-morbidities  that affects occupational performance. Patient will benefit from skilled OT to address above impairments and improve overall function.  MODIFICATION OR ASSISTANCE TO COMPLETE EVALUATION: No modification of tasks or assist necessary to complete an evaluation.  OT OCCUPATIONAL PROFILE AND HISTORY: Problem focused assessment: Including review of records relating to presenting problem.  CLINICAL DECISION MAKING: Moderate - several treatment options, min-mod task modification necessary  REHAB POTENTIAL: Good  EVALUATION COMPLEXITY: Low      PLAN:  OT FREQUENCY: 1x/week  OT DURATION: 8 weeks (plus eval)  PLANNED INTERVENTIONS: self care/ADL training, therapeutic exercise, therapeutic activity, manual therapy, passive range of motion, splinting, ultrasound, paraffin, fluidotherapy, moist heat, cryotherapy, contrast bath, patient/family education, coping strategies training, and DME and/or AE instructions  RECOMMENDED OTHER SERVICES: none at this time  CONSULTED AND AGREED WITH PLAN OF CARE: Patient  PLAN FOR NEXT SESSION: paraffin BOTH hands, review putty HEP, A/E needs   Sheran Lawless, OT 07/04/2022, 9:07 AM

## 2022-07-09 ENCOUNTER — Other Ambulatory Visit: Payer: Self-pay | Admitting: Family Medicine

## 2022-07-10 ENCOUNTER — Encounter: Payer: Self-pay | Admitting: Family Medicine

## 2022-07-10 ENCOUNTER — Ambulatory Visit (HOSPITAL_BASED_OUTPATIENT_CLINIC_OR_DEPARTMENT_OTHER)
Admission: RE | Admit: 2022-07-10 | Discharge: 2022-07-10 | Disposition: A | Discharge status: Home / Self Care | Payer: 59 | Source: Ambulatory Visit | Attending: Family Medicine | Admitting: Family Medicine

## 2022-07-10 ENCOUNTER — Ambulatory Visit (INDEPENDENT_AMBULATORY_CARE_PROVIDER_SITE_OTHER): Payer: 59 | Admitting: Family Medicine

## 2022-07-10 VITALS — BP 142/88 | HR 72 | Wt 152.8 lb

## 2022-07-10 DIAGNOSIS — G459 Transient cerebral ischemic attack, unspecified: Secondary | ICD-10-CM

## 2022-07-10 DIAGNOSIS — I10 Essential (primary) hypertension: Secondary | ICD-10-CM

## 2022-07-10 DIAGNOSIS — R202 Paresthesia of skin: Secondary | ICD-10-CM | POA: Diagnosis not present

## 2022-07-10 MED ORDER — PANTOPRAZOLE SODIUM 40 MG PO TBEC: 40.0000 mg | DELAYED_RELEASE_TABLET | Freq: Every day | ORAL | 1 refills | Status: DC

## 2022-07-10 MED ORDER — AMITRIPTYLINE HCL 25 MG PO TABS: ORAL_TABLET | ORAL | 1 refills | Status: DC

## 2022-07-10 MED ORDER — GADOBUTROL 1 MMOL/ML IV SOLN
7.0000 mL | Freq: Once | INTRAVENOUS | Status: AC | PRN
  Administered 2022-07-10: 7 mL via INTRAVENOUS

## 2022-07-10 MED ORDER — TAMSULOSIN HCL 0.4 MG PO CAPS: 0.4000 mg | ORAL_CAPSULE | Freq: Every day | ORAL | 1 refills | Status: DC

## 2022-07-10 MED ORDER — NOVOLOG FLEXPEN 100 UNIT/ML ~~LOC~~ SOPN: PEN_INJECTOR | SUBCUTANEOUS | 1 refills | Status: DC

## 2022-07-10 MED ORDER — LOSARTAN POTASSIUM 100 MG PO TABS: 100.0000 mg | ORAL_TABLET | Freq: Every day | ORAL | 1 refills | Status: DC

## 2022-07-10 MED ORDER — LANTUS SOLOSTAR 100 UNIT/ML ~~LOC~~ SOPN: 20.0000 [IU] | PEN_INJECTOR | Freq: Every day | SUBCUTANEOUS | 1 refills | Status: DC

## 2022-07-10 NOTE — Progress Notes (Signed)
OFFICE VISIT  07/10/2022  CC:  Chief Complaint  Patient presents with   Follow-up    Bp and facial paresthesia.     Patient is a 57 y.o. male who presents for 3-week follow-up hypertension and facial paresthesias. Last visit we added Toprol-XL 25 mg a day to his losartan 100 mg a day. Labs last visit were all good.  INTERIM HX: Gaynelle Adu has no new symptoms.  He continues to have frequent brief periods of tingling and numbness in the right side of his face and right side of neck. No new paresthesias.  No focal weakness, no vision changes, no facial drooping, no speech abnormalities, no swallowing dysfunction. His MRI brain and MR angio head and neck are scheduled for this afternoon.  Home blood pressures have been in the 150/90 average.  Typical heart rate is in the 70s.  Past Medical History:  Diagnosis Date   Back pain    s/p back surgery   Carpal tunnel syndrome on both sides    Chronic hyponatremia 08/2012 onset   Onset was right after starting amitriptyline for his neuropathic leg pain--has been stable.  Likely ADH-like effect on kidney from amitriptyline.   COVID-19 virus infection 09/03/2020   History of low back pain 06/24/2011   Hyperlipidemia    Hypothyroidism    LADA (latent autoimmune diabetes in adults), managed as type 1 (HCC) Dx'd 2014   Dr. Morrison Old.   Migraines    Neuropathic pain of right lower extremity    Osteoarthritis of hands, bilateral    Osteoarthritis of wrists, bilateral    PLANTAR WART, RIGHT 10/09/2009   Snoring    Sleep study neg  OSA 04/2018   Tobacco dependence    quit 2012; restarted not long after    Past Surgical History:  Procedure Laterality Date   BACK SURGERY  02/12/2007   lower lumbar fusion- rubber disc in back   CARDIOVASCULAR STRESS TEST  03/21/2014   No ischemia/low risk study; however, LV function showed EF 49% and global LV hypokinesis--echo recommended and it showed normal LV fxn.   CARPAL TUNNEL RELEASE     bilat 2023/24    NASAL SINUS SURGERY  02/11/2005   Polysomnogram  04/2018   NO OSA (Dr. Frances Furbish)   TRANSTHORACIC ECHOCARDIOGRAM  03/24/2014   Normal LV fxn, EF 50-55%, no wall motion abnormalities: (grade 1 diast dysfxn)   WRIST SURGERY     2023/24 L wrist [facility] arthroplasty    Outpatient Medications Prior to Visit  Medication Sig Dispense Refill   atorvastatin (LIPITOR) 20 MG tablet Take 1 tablet (20 mg total) by mouth daily. 90 tablet 3   buPROPion (WELLBUTRIN XL) 150 MG 24 hr tablet Take 1 tablet (150 mg total) by mouth daily. 90 tablet 3   citalopram (CELEXA) 40 MG tablet Take 1 tablet (40 mg total) by mouth daily. 90 tablet 3   Continuous Glucose Sensor (FREESTYLE LIBRE 2 SENSOR) MISC APPLY ROUTE EVERY 14 DAYS. 2 each 5   fluticasone (FLONASE) 50 MCG/ACT nasal spray Place 2 sprays into both nostrils daily. 16 g 3   Insulin Pen Needle (SURE COMFORT PEN NEEDLES) 32G X 4 MM MISC USE FOUR TIMES DAILY AS NEEDED 100 each 10   levothyroxine (SYNTHROID) 137 MCG tablet Take 1-1/2 tabs on Mondays Wednesdays and Fridays and 1 tab all other days. 96 tablet 0   meloxicam (MOBIC) 15 MG tablet Take 1 tablet (15 mg total) by mouth daily. 30 tablet 5   metoprolol succinate (TOPROL  XL) 25 MG 24 hr tablet Take 1 tablet (25 mg total) by mouth daily. 30 tablet 0   oxyCODONE (OXY IR/ROXICODONE) 5 MG immediate release tablet 1-2 tabs po qid prn 90 tablet 0   sildenafil (VIAGRA) 100 MG tablet Take 1 tablet (100 mg total) by mouth daily as needed for erectile dysfunction. 10 tablet 11   insulin aspart (NOVOLOG FLEXPEN) 100 UNIT/ML FlexPen INJECT 15 UNITS NINTO THE SKIN BEFORE EVERY A MEAL 15 mL 1   insulin glargine (LANTUS SOLOSTAR) 100 UNIT/ML Solostar Pen Inject 20 Units into the skin daily. 15 mL 1   amitriptyline (ELAVIL) 25 MG tablet TAKE ONE TABLET BY MOUTH EVERY MORNING AND TAKE THREE TABLETS AT BEDTIME for leg pain 120 tablet 0   losartan (COZAAR) 100 MG tablet Take 1 tablet (100 mg total) by mouth daily. 90 tablet 3    pantoprazole (PROTONIX) 40 MG tablet Take 1 tablet (40 mg total) by mouth daily. 90 tablet 3   tamsulosin (FLOMAX) 0.4 MG CAPS capsule Take 1 capsule (0.4 mg total) by mouth daily after supper. 90 capsule 3   No facility-administered medications prior to visit.    Allergies  Allergen Reactions   Amlodipine Other (See Comments)    Headache   Augmentin [Amoxicillin-Pot Clavulanate] Nausea And Vomiting   Oxycodone-Acetaminophen     REACTION: itching   Levothyroxine Rash    Had rash associated with generic levothyroxine, but not brand-name Synthroid.  Likely allergic to a dye or filler in the generic preparation.   Lisinopril Cough    Review of Systems As per HPI  PE:    07/10/2022    2:24 PM 07/10/2022    2:07 PM 06/17/2022   10:24 AM  Vitals with BMI  Weight  152 lbs 13 oz   BMI  25.43   Systolic 142 162 696  Diastolic 88 100 90  Pulse  72      Physical Exam  Gen: Alert, well appearing.  Patient is oriented to person, place, time, and situation. No further exam today  LABS:  Last CBC Lab Results  Component Value Date   WBC 8.6 06/17/2022   HGB 15.1 06/17/2022   HCT 44.5 06/17/2022   MCV 90.8 06/17/2022   MCH 31.5 12/01/2020   RDW 13.7 06/17/2022   PLT 293.0 06/17/2022   Last metabolic panel Lab Results  Component Value Date   GLUCOSE 124 (H) 06/17/2022   NA 131 (L) 06/17/2022   K 4.1 06/17/2022   CL 92 (L) 06/17/2022   CO2 30 06/17/2022   BUN 8 06/17/2022   CREATININE 0.85 06/17/2022   GFRNONAA >60 09/15/2018   CALCIUM 9.4 06/17/2022   PROT 6.7 06/17/2022   ALBUMIN 4.1 06/17/2022   BILITOT 0.8 06/17/2022   ALKPHOS 145 (H) 06/17/2022   AST 15 06/17/2022   ALT 16 06/17/2022   ANIONGAP 10 09/15/2018   Last lipids Lab Results  Component Value Date   CHOL 105 06/17/2022   HDL 57.00 06/17/2022   LDLCALC 38 06/17/2022   TRIG 48.0 06/17/2022   CHOLHDL 2 06/17/2022   Last hemoglobin A1c Lab Results  Component Value Date   HGBA1C 6.9 (A) 06/17/2022    HGBA1C 6.9 06/17/2022   HGBA1C 6.9 (A) 06/17/2022   HGBA1C 6.9 06/17/2022   IMPRESSION AND PLAN:  #1  Paresthesias: Subacute.  Right side of face and neck. Question TIA.  Also r/o brain mass, aneurism, or demyelinating dz. Checking MRI brain with and without contrast, MRA neck with  and without contrast, and MRA brain without contrast--> these are scheduled for this afternoon. Will refer to neurology when results of imaging have come in.  2 uncontrolled hypertension. Inc toprol xl to 50 qd.  Cont losartan 100 qd.  An After Visit Summary was printed and given to the patient.  FOLLOW UP: No follow-ups on file.TBD depending on results of imaging. Anticipate usual routine follow-up for pain management approximately 09/17/2022.  Signed:  Santiago Bumpers, MD           07/10/2022

## 2022-07-11 ENCOUNTER — Telehealth: Payer: Self-pay

## 2022-07-11 ENCOUNTER — Other Ambulatory Visit: Payer: Self-pay | Admitting: Family Medicine

## 2022-07-11 ENCOUNTER — Ambulatory Visit: Payer: 59 | Admitting: Occupational Therapy

## 2022-07-11 MED ORDER — OXYCODONE HCL 5 MG PO TABS: ORAL_TABLET | ORAL | 0 refills | Status: DC

## 2022-07-11 NOTE — Telephone Encounter (Signed)
PA sent for Oxycodone and Lantus. Currently pending for determination. Pt advised  Daniel Stevenson (Key: [id number]) - [id number] Lantus SoloStar 100UNIT/ML pen-injectors Status: PA RequestCreated: May 30th, 2024Sent: May 30th, 2024   Daniel Stevenson [facility]: [place]) Rx #: [phone] Status: Sent to Plan today Drug oxyCODONE HCl 5MG  tablets

## 2022-07-11 NOTE — Telephone Encounter (Signed)
Requesting: oxycodone Contract: 08/02/21 UDS: 08/30/21 Last Visit: 07/10/22 Next Visit: n/a Last Refill: 06/11/22 (90,0)  Please Advise. Med pending

## 2022-07-15 ENCOUNTER — Telehealth: Payer: Self-pay

## 2022-07-15 NOTE — Telephone Encounter (Signed)
Daniel Stevenson called with report for imaging  IMPRESSION:  5. Incompletely imaged focus of signal abnormality within the C3 vertebral body and right C3 posterior elements. This could reflect degenerative sclerosis. However, more worrisome etiologies (such as osseous metastatic disease or an aggressive primary bone lesion) cannot be excluded, and a cervical spine MRI (with and without contrast) is recommended for further evaluation

## 2022-07-16 ENCOUNTER — Other Ambulatory Visit: Payer: Self-pay | Admitting: Family Medicine

## 2022-07-16 DIAGNOSIS — R202 Paresthesia of skin: Secondary | ICD-10-CM

## 2022-07-16 DIAGNOSIS — M899 Disorder of bone, unspecified: Secondary | ICD-10-CM

## 2022-07-16 DIAGNOSIS — G379 Demyelinating disease of central nervous system, unspecified: Secondary | ICD-10-CM

## 2022-07-17 NOTE — Therapy (Signed)
OUTPATIENT OCCUPATIONAL THERAPY ORTHO TREATMENT  Patient Name: Daniel Stevenson MRN: [id number] DOB:[birth year], 57 y.o., male Today's Date: 07/17/2022  PCP: Jeoffrey Massed REFERRING PROVIDER: Gomez Cleverly, MD  END OF SESSION:    Past Medical History:  Diagnosis Date   Back pain    s/p back surgery   Carpal tunnel syndrome on both sides    Chronic hyponatremia 08/2012 onset   Onset was right after starting amitriptyline for his neuropathic leg pain--has been stable.  Likely ADH-like effect on kidney from amitriptyline.   COVID-19 virus infection 09/03/2020   History of low back pain 06/24/2011   Hyperlipidemia    Hypothyroidism    LADA (latent autoimmune diabetes in adults), managed as type 1 (HCC) Dx'd 2014   Dr. Morrison Old.   Migraines    Neuropathic pain of right lower extremity    Osteoarthritis of hands, bilateral    Osteoarthritis of wrists, bilateral    PLANTAR WART, RIGHT 10/09/2009   Snoring    Sleep study neg  OSA 04/2018   Tobacco dependence    quit 2012; restarted not long after   Past Surgical History:  Procedure Laterality Date   BACK SURGERY  02/12/2007   lower lumbar fusion- rubber disc in back   CARDIOVASCULAR STRESS TEST  03/21/2014   No ischemia/low risk study; however, LV function showed EF 49% and global LV hypokinesis--echo recommended and it showed normal LV fxn.   CARPAL TUNNEL RELEASE     bilat 2023/24   NASAL SINUS SURGERY  02/11/2005   Polysomnogram  04/2018   NO OSA (Dr. Frances Furbish)   TRANSTHORACIC ECHOCARDIOGRAM  03/24/2014   Normal LV fxn, EF 50-55%, no wall motion abnormalities: (grade 1 diast dysfxn)   WRIST SURGERY     2023/24 L wrist [facility] arthroplasty   Patient Active Problem List   Diagnosis Date Noted   Carpal tunnel syndrome of left wrist 08/13/2021   Other symptoms and signs involving cognitive functions and awareness 06/30/2019   Lymphadenitis, acute 05/02/2015   Headache 01/10/2015   Fatigue 01/10/2015   Allergic rhinitis  01/10/2015   Medication intolerance 09/06/2013   Health maintenance examination 07/13/2013   Type II or unspecified type diabetes mellitus without mention of complication, uncontrolled 01/18/2013   GERD (gastroesophageal reflux disease) 01/18/2013   Right leg pain 08/18/2012   Ganglion cyst of flexor tendon sheath 08/05/2011   History of low back pain 06/24/2011   Tobacco dependence 11/20/2010   Hypothyroidism 10/09/2009    ONSET DATE: 06/18/2022  REFERRING DIAG: M18.12 (ICD-10-CM) - Unilateral primary osteoarthritis of first carpometacarpal joint, left hand  THERAPY DIAG:  No diagnosis found.  Rationale for Evaluation and Treatment: Rehabilitation  SUBJECTIVE:   SUBJECTIVE STATEMENT: My Rt thumb is bothering me but my Lt thumb is still weak. I probably need surgery to my Rt thumb too. I also trigger Rt long finger Pt accompanied by: self  PERTINENT HISTORY: DMT1, hypothyroidism, and chronic pain syndrome. Status post bilateral carpal tunnel surgery 08/2021 and 11/2021, s/p Lt [facility] arthroplasty 01/2022  PRECAUTIONS: Other: previous back surgeries  WEIGHT BEARING RESTRICTIONS: No  PAIN:  Are you having pain? Yes: NPRS scale: 4/10 Pain location: Lt thumb, Rt thumb 6/10 Pain description: achy Aggravating factors: movement Relieving factors: hot water  FALLS: Has patient fallen in last 6 months? 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 , and Leisure: fishing, playing with grandchildren  PATIENT GOALS: strengthen Lt thumb, decrease pain/symptoms Rt thumb  NEXT MD VISIT: none  OBJECTIVE:   HAND DOMINANCE: Right  ADLs:  Eating: difficulty cutting food Grooming: mod I  UB Dressing: mod I w/ pullover shirts LB Dressing: difficulty buttoning pants, getting laces  tight Toileting: mod I Bathing: mod I  Tub Shower transfers: independent **Difficulty giving himself insulin, opening jars/tight containers, and pants buttons  FUNCTIONAL OUTCOME MEASURES: Quick Dash: 61% disabled in hands  UPPER EXTREMITY ROM:   BUE AROM WNL's except thumbs - opposes to 4th digit only bilaterally, thumb MP flexion limited bilaterally (Lt worse than Rt)    Active ROM Right eval Left eval  Thumb MCP (0-60) 30* 15*  Thumb IP (0-80)    Thumb Radial abd/add (0-55) 75 60   Thumb Palmar abd/add (0-45) 65 60   Thumb Opposition to Small Finger     Index MCP (0-90)     Index PIP (0-100)     Index DIP (0-70)      Long MCP (0-90)      Long PIP (0-100)      Long DIP (0-70)      Ring MCP (0-90)      Ring PIP (0-100)      Ring DIP (0-70)      Little MCP (0-90)      Little PIP (0-100)      Little DIP (0-70)      (Blank rows = not tested)    HAND FUNCTION: Grip strength: Right: 44.5 lbs; Left: 18.5 lbs,  Lateral pinch: Right: 12 lbs, Left: 7 lbs, and  3 point pinch: Right: 6 lbs, Left: 5 lbs  COORDINATION: 9 Hole Peg test: Right: 20.97 sec; Left: 21 sec  SENSATION: Pt reports Rt LF goes numb and swells up at times  EDEMA: very minimal Rt fingers  COGNITION: Overall cognitive status: Within functional limits for tasks assessed  OBSERVATIONS: Pt w/ more limited Lt thumb ROM d/t CMC arthroplasty, however Rt thumb has begun bothering him   TODAY'S TREATMENT:                                                                                                                              Assessed current splints for Lt thumb which still work and fit well.  Sized pt for neoprene splint for Rt thumb and provided handout on neoprene splint for Rt thumb and where to purchase  Fluidotherapy x 12 min Lt hand (vs both per pt request) to decrease pain/stiffness.   Pt issued A/ROM and strengthening HEP for bilateral thumbs - see pt instructions for details. Pt issued yellow  putty for Lt hand and bilateral pinch strength, red putty for Rt grip only Pt demo each x 10 reps each hand  PATIENT EDUCATION: Education details: A/ROM and putty HEP for bilateral thumbs Person educated: Patient Education method: Explanation Education comprehension: verbalized  understanding  HOME EXERCISE PROGRAM: 07/04/22: AROM and putty HEP for bilateral thumbs  GOALS: Goals reviewed with patient? Yes  SHORT TERM GOALS: Target date: 07/25/22  Independent with HEP for bilateral grip and pinch strength Baseline: Goal status: IN PROGRESS  2.  Pt to verbalize understanding with pain management strategies including use of modalities, joint protection strategies, task modifications, and splinting needs for bilateral thumbs Baseline:  Goal status: INITIAL  3.  Pt to verbalize understanding with potential A/E needs like jar openers, button hook, and for cutting food, etc.  Baseline:  Goal status: INITIAL  4.  Pt will verbalize understanding to minimize symptoms of Rt long finger trigger finger Baseline:  Goal status: INITIAL    LONG TERM GOALS: Target date: 08/24/22  Pt will improve grip strength Lt hand to 25 lbs to open jars/containers w/ greater ease Baseline: 18.5 lbs Goal status: INITIAL  2.  Pt to improve Lt lateral pinch to 9 lbs or greater for functional pinching Baseline: 7 lbs Goal status: INITIAL  3.  Pt to improve bilateral 3 tip pinch by 2 lbs or greater  Baseline:  Rt = 6 lbs, Lt = 5 lbs Goal status: INITIAL  4.  Pt to improve overall function as evidenced by scoring 50% or less on Quick Dash  Baseline: 61% Goal status: INITIAL    ASSESSMENT:  CLINICAL IMPRESSION: Pt tolerating HEP well. Pt w/ no increase in pain today.   PERFORMANCE DEFICITS: in functional skills including ADLs, IADLs, sensation, edema, ROM, strength, pain, body mechanics, decreased knowledge of use of DME, and UE functional use,.   IMPAIRMENTS: are limiting patient from ADLs,  IADLs, work, and leisure.   COMORBIDITIES: may have co-morbidities  that affects occupational performance. Patient will benefit from skilled OT to address above impairments and improve overall function.  REHAB POTENTIAL: Good   PLAN:  OT FREQUENCY: 1x/week  OT DURATION: 8 weeks (plus eval)  PLANNED INTERVENTIONS: self care/ADL training, therapeutic exercise, therapeutic activity, manual therapy, passive range of motion, splinting, ultrasound, paraffin, fluidotherapy, moist heat, cryotherapy, contrast bath, patient/family education, coping strategies training, and DME and/or AE instructions  RECOMMENDED OTHER SERVICES: none at this time  CONSULTED AND AGREED WITH PLAN OF CARE: Patient  PLAN FOR NEXT SESSION: paraffin BOTH hands, review putty HEP, A/E needs   Delana Meyer, OT 07/17/2022, 5:31 PM

## 2022-07-18 ENCOUNTER — Ambulatory Visit: Payer: 59 | Attending: Orthopedic Surgery | Admitting: Occupational Therapy

## 2022-07-18 DIAGNOSIS — M79644 Pain in right finger(s): Secondary | ICD-10-CM | POA: Diagnosis present

## 2022-07-18 DIAGNOSIS — M25541 Pain in joints of right hand: Secondary | ICD-10-CM | POA: Diagnosis present

## 2022-07-18 DIAGNOSIS — M25542 Pain in joints of left hand: Secondary | ICD-10-CM | POA: Diagnosis present

## 2022-07-18 DIAGNOSIS — M25642 Stiffness of left hand, not elsewhere classified: Secondary | ICD-10-CM | POA: Diagnosis present

## 2022-07-18 DIAGNOSIS — M6281 Muscle weakness (generalized): Secondary | ICD-10-CM | POA: Diagnosis present

## 2022-07-18 NOTE — Therapy (Signed)
OUTPATIENT OCCUPATIONAL THERAPY ORTHO TREATMENT  Patient Name: Daniel Stevenson MRN: [id number] DOB:03-06-1965, 57 y.o., male Today's Date: 07/18/2022  PCP: Jeoffrey Massed REFERRING PROVIDER: Gomez Cleverly, MD  END OF SESSION:  OT End of Session - 07/18/22 1153     Visit Number 3    Number of Visits 9    Date for OT Re-Evaluation 08/24/22    Authorization Type [employer] - requires auth after 5th visit    Authorization - Visit Number 3    Authorization - Number of Visits 5    OT Start Time 1152    OT Stop Time 1231    OT Time Calculation (min) 39 min    Activity Tolerance Patient tolerated treatment well    Behavior During Therapy [facility] for tasks assessed/performed              Past Medical History:  Diagnosis Date   Back pain    s/p back surgery   Carpal tunnel syndrome on both sides    Chronic hyponatremia 08/2012 onset   Onset was right after starting amitriptyline for his neuropathic leg pain--has been stable.  Likely ADH-like effect on kidney from amitriptyline.   COVID-19 virus infection 09/03/2020   History of low back pain 06/24/2011   Hyperlipidemia    Hypothyroidism    LADA (latent autoimmune diabetes in adults), managed as type 1 (HCC) Dx'd 2014   Dr. Morrison Old.   Migraines    Neuropathic pain of right lower extremity    Osteoarthritis of hands, bilateral    Osteoarthritis of wrists, bilateral    PLANTAR WART, RIGHT 10/09/2009   Snoring    Sleep study neg  OSA 04/2018   Tobacco dependence    quit 2012; restarted not long after   Past Surgical History:  Procedure Laterality Date   BACK SURGERY  02/12/2007   lower lumbar fusion- rubber disc in back   CARDIOVASCULAR STRESS TEST  03/21/2014   No ischemia/low risk study; however, LV function showed EF 49% and global LV hypokinesis--echo recommended and it showed normal LV fxn.   CARPAL TUNNEL RELEASE     bilat 2023/24   NASAL SINUS SURGERY  02/11/2005   Polysomnogram  04/2018   NO OSA (Dr. Frances Furbish)    TRANSTHORACIC ECHOCARDIOGRAM  03/24/2014   Normal LV fxn, EF 50-55%, no wall motion abnormalities: (grade 1 diast dysfxn)   WRIST SURGERY     2023/24 L wrist [facility] arthroplasty   Patient Active Problem List   Diagnosis Date Noted   Carpal tunnel syndrome of left wrist 08/13/2021   Other symptoms and signs involving cognitive functions and awareness 06/30/2019   Lymphadenitis, acute 05/02/2015   Headache 01/10/2015   Fatigue 01/10/2015   Allergic rhinitis 01/10/2015   Medication intolerance 09/06/2013   Health maintenance examination 07/13/2013   Type II or unspecified type diabetes mellitus without mention of complication, uncontrolled 01/18/2013   GERD (gastroesophageal reflux disease) 01/18/2013   Right leg pain 08/18/2012   Ganglion cyst of flexor tendon sheath 08/05/2011   History of low back pain 06/24/2011   Tobacco dependence 11/20/2010   Hypothyroidism 10/09/2009    ONSET DATE: 06/18/2022  REFERRING DIAG: M18.12 (ICD-10-CM) - Unilateral primary osteoarthritis of first carpometacarpal joint, left hand  THERAPY DIAG:  Pain in thumb joint with movement of left hand  Muscle weakness (generalized)  Stiffness of left hand, not elsewhere classified  Pain in thumb joint with movement of right hand  Pain in right finger(s)  Rationale for Evaluation  and Treatment: Rehabilitation  SUBJECTIVE:   SUBJECTIVE STATEMENT: Pt reports he knows he is going to heal slower due to his diabetes. He has been using yellow putty, which he forgot today.   Pt accompanied by: self  PERTINENT HISTORY: DMT1, hypothyroidism, and chronic pain syndrome. Status post bilateral carpal tunnel surgery 08/2021 and 11/2021, s/p Lt [facility] arthroplasty 01/2022  PRECAUTIONS: Other: previous back surgeries  WEIGHT BEARING RESTRICTIONS: No  PAIN:  Are you having pain? Yes: NPRS scale: 3/10 Pain location: Lt thumb, Rt thumb Pain description: achy Aggravating factors: movement Relieving factors: hot  water  FALLS: Has patient fallen in last 6 months? 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 , and Leisure: fishing, playing with grandchildren  PATIENT GOALS: strengthen Lt thumb, decrease pain/symptoms Rt thumb  NEXT MD VISIT: none  OBJECTIVE:   HAND DOMINANCE: Right  ADLs:  Eating: difficulty cutting food Grooming: mod I  UB Dressing: mod I w/ pullover shirts LB Dressing: difficulty buttoning pants, getting laces tight Toileting: mod I Bathing: mod I  Tub Shower transfers: independent **Difficulty giving himself insulin, opening jars/tight containers, and pants buttons  FUNCTIONAL OUTCOME MEASURES: Quick Dash: 61% disabled in hands  UPPER EXTREMITY ROM:   BUE AROM WNL's except thumbs - opposes to 4th digit only bilaterally, thumb MP flexion limited bilaterally (Lt worse than Rt)    Active ROM Right eval Left eval  Thumb MCP (0-60) 30* 15*  Thumb IP (0-80)    Thumb Radial abd/add (0-55) 75 60   Thumb Palmar abd/add (0-45) 65 60   Thumb Opposition to Small Finger     Index MCP (0-90)     Index PIP (0-100)     Index DIP (0-70)      Long MCP (0-90)      Long PIP (0-100)      Long DIP (0-70)      Ring MCP (0-90)      Ring PIP (0-100)      Ring DIP (0-70)      Little MCP (0-90)      Little PIP (0-100)      Little DIP (0-70)      (Blank rows = not tested)    HAND FUNCTION: Grip strength: Right: 44.5 lbs; Left: 18.5 lbs,  Lateral pinch: Right: 12 lbs, Left: 7 lbs, and  3 point pinch: Right: 6 lbs, Left: 5 lbs  COORDINATION: 9 Hole Peg test: Right: 20.97 sec; Left: 21 sec  SENSATION: Pt reports Rt LF goes numb and swells up at times  EDEMA: very minimal Rt fingers  COGNITION: Overall cognitive status: Within functional limits for tasks  assessed  OBSERVATIONS: Pt w/ more limited Lt thumb ROM d/t CMC arthroplasty, however Rt thumb has begun bothering him   TODAY'S TREATMENT:              - Therapeutic exercises completed for duration as noted below including:  OT reviewed BUE HEP as noted in pt instructions.                                                                                                                    -  Self-care/home management completed for duration as noted below including: Pt completed B paraffin as therapist educated on joint protection principles and ergonomics as noted in pt instructions. Pt educated on how to obtain paraffin as needed.   - Manual therapy completed for duration as noted below including:  Therapist completed IASTM using edge mobility tool at site of incision using free up lotion as emollient for reduction of scar tissue and to promote improved AROM and pain reduction of affected extremity. Improved appearance to incision noted upon completion with less palpable adhesions.  PATIENT EDUCATION: Education details: A/ROM and putty HEP for bilateral thumbs Person educated: Patient Education method: Explanation Education comprehension: verbalized understanding  HOME EXERCISE PROGRAM: 07/04/22: AROM and putty HEP for bilateral thumbs  GOALS: Goals reviewed with patient? Yes  SHORT TERM GOALS: Target date: 07/25/22  Independent with HEP for bilateral grip and pinch strength Baseline: Goal status: IN PROGRESS  2.  Pt to verbalize understanding with pain management strategies including use of modalities, joint protection strategies, task modifications, and splinting needs for bilateral thumbs Baseline:  Goal status: INITIAL  3.  Pt to verbalize understanding with potential A/E needs like jar openers, button hook, and for cutting food, etc.  Baseline:  Goal status: INITIAL  4.  Pt will verbalize understanding to minimize symptoms of Rt long finger trigger finger Baseline:   Goal status: INITIAL    LONG TERM GOALS: Target date: 08/24/22  Pt will improve grip strength Lt hand to 25 lbs to open jars/containers w/ greater ease Baseline: 18.5 lbs Goal status: INITIAL  2.  Pt to improve Lt lateral pinch to 9 lbs or greater for functional pinching Baseline: 7 lbs Goal status: INITIAL  3.  Pt to improve bilateral 3 tip pinch by 2 lbs or greater  Baseline:  Rt = 6 lbs, Lt = 5 lbs Goal status: INITIAL  4.  Pt to improve overall function as evidenced by scoring 50% or less on Quick Dash  Baseline: 61% Goal status: INITIAL    ASSESSMENT:  CLINICAL IMPRESSION: Pt demonstrates good understanding of HEP as needed to progress towards goals. Potential for scar tissue with nerve involvement at site of L incision.   PERFORMANCE DEFICITS: in functional skills including ADLs, IADLs, sensation, edema, ROM, strength, pain, body mechanics, decreased knowledge of use of DME, and UE functional use,.   IMPAIRMENTS: are limiting patient from ADLs, IADLs, work, and leisure.   COMORBIDITIES: may have co-morbidities  that affects occupational performance. Patient will benefit from skilled OT to address above impairments and improve overall function.  REHAB POTENTIAL: Good   PLAN:  OT FREQUENCY: 1x/week  OT DURATION: 8 weeks (plus eval)  PLANNED INTERVENTIONS: self care/ADL training, therapeutic exercise, therapeutic activity, manual therapy, passive range of motion, splinting, ultrasound, paraffin, fluidotherapy, moist heat, cryotherapy, contrast bath, patient/family education, coping strategies training, and DME and/or AE instructions  RECOMMENDED OTHER SERVICES: none at this time  CONSULTED AND AGREED WITH PLAN OF CARE: Patient  PLAN FOR NEXT SESSION: review putty HEP, A/E needs; simulated insulin administration   Delana Meyer, OT 07/18/2022, 5:57 PM

## 2022-07-19 ENCOUNTER — Other Ambulatory Visit: Payer: Self-pay | Admitting: Family Medicine

## 2022-07-19 ENCOUNTER — Telehealth: Payer: Self-pay

## 2022-07-19 NOTE — Telephone Encounter (Signed)
(  Key: [id number]) PA Case ID #: [id number] Rx #: [id number] Need Help? Call us at [phone] Status sent iconSent to Plan today

## 2022-07-19 NOTE — Telephone Encounter (Signed)
(  Key: [id number])  form thumbnail Your information has been submitted to [employer]. To check for an updated outcome later, reopen this PA request from your dashboard.  If [employer] has not responded to your request within 24 hours, contact [employer] at [phone]. If you think there may be a problem with your PA request, use our live chat feature at the bottom right.

## 2022-07-24 ENCOUNTER — Ambulatory Visit: Payer: 59 | Admitting: Occupational Therapy

## 2022-07-24 DIAGNOSIS — M25542 Pain in joints of left hand: Secondary | ICD-10-CM

## 2022-07-24 DIAGNOSIS — M25541 Pain in joints of right hand: Secondary | ICD-10-CM

## 2022-07-24 DIAGNOSIS — M6281 Muscle weakness (generalized): Secondary | ICD-10-CM

## 2022-07-24 NOTE — Therapy (Signed)
OUTPATIENT OCCUPATIONAL THERAPY ORTHO TREATMENT  Patient Name: UNK LYBECK MRN: [id number] DOB:1965-06-24, 57 y.o., male Today's Date: 07/24/2022  PCP: Jeoffrey Massed REFERRING PROVIDER: Gomez Cleverly, MD  END OF SESSION:  OT End of Session - 07/24/22 1013     Visit Number 4    Number of Visits 9    Date for OT Re-Evaluation 08/24/22    Authorization Type Jari Favre MCD - requires auth after 5th visit    Authorization - Number of Visits 5    OT Start Time 1015    OT Stop Time 1058    OT Time Calculation (min) 43 min    Equipment Utilized During Treatment putty and FM objects    Activity Tolerance Patient tolerated treatment well;No increased pain    Behavior During Therapy [facility] for tasks assessed/performed              Past Medical History:  Diagnosis Date   Back pain    s/p back surgery   Carpal tunnel syndrome on both sides    Chronic hyponatremia 08/2012 onset   Onset was right after starting amitriptyline for his neuropathic leg pain--has been stable.  Likely ADH-like effect on kidney from amitriptyline.   COVID-19 virus infection 09/03/2020   History of low back pain 06/24/2011   Hyperlipidemia    Hypothyroidism    LADA (latent autoimmune diabetes in adults), managed as type 1 (HCC) Dx'd 2014   Dr. Morrison Old.   Migraines    Neuropathic pain of right lower extremity    Osteoarthritis of hands, bilateral    Osteoarthritis of wrists, bilateral    PLANTAR WART, RIGHT 10/09/2009   Snoring    Sleep study neg  OSA 04/2018   Tobacco dependence    quit 2012; restarted not long after   Past Surgical History:  Procedure Laterality Date   BACK SURGERY  02/12/2007   lower lumbar fusion- rubber disc in back   CARDIOVASCULAR STRESS TEST  03/21/2014   No ischemia/low risk study; however, LV function showed EF 49% and global LV hypokinesis--echo recommended and it showed normal LV fxn.   CARPAL TUNNEL RELEASE     bilat 2023/24   NASAL SINUS SURGERY  02/11/2005    Polysomnogram  04/2018   NO OSA (Dr. Frances Furbish)   TRANSTHORACIC ECHOCARDIOGRAM  03/24/2014   Normal LV fxn, EF 50-55%, no wall motion abnormalities: (grade 1 diast dysfxn)   WRIST SURGERY     2023/24 L wrist [facility] arthroplasty   Patient Active Problem List   Diagnosis Date Noted   Carpal tunnel syndrome of left wrist 08/13/2021   Other symptoms and signs involving cognitive functions and awareness 06/30/2019   Lymphadenitis, acute 05/02/2015   Headache 01/10/2015   Fatigue 01/10/2015   Allergic rhinitis 01/10/2015   Medication intolerance 09/06/2013   Health maintenance examination 07/13/2013   Type II or unspecified type diabetes mellitus without mention of complication, uncontrolled 01/18/2013   GERD (gastroesophageal reflux disease) 01/18/2013   Right leg pain 08/18/2012   Ganglion cyst of flexor tendon sheath 08/05/2011   History of low back pain 06/24/2011   Tobacco dependence 11/20/2010   Hypothyroidism 10/09/2009    ONSET DATE: 06/18/2022  REFERRING DIAG: M18.12 (ICD-10-CM) - Unilateral primary osteoarthritis of first carpometacarpal joint, left hand  THERAPY DIAG:  Pain in thumb joint with movement of left hand  Pain in thumb joint with movement of right hand  Muscle weakness (generalized)  Rationale for Evaluation and Treatment: Rehabilitation  SUBJECTIVE:   SUBJECTIVE  STATEMENT:  Patient has an endocrinologist appt after OT today and brought his insulin pen/materials.  Pt accompanied by: self  PERTINENT HISTORY: DMT1, hypothyroidism, and chronic pain syndrome. Status post bilateral carpal tunnel surgery 08/2021 and 11/2021, s/p Lt [facility] arthroplasty 01/2022  PRECAUTIONS: Other: previous back surgeries  WEIGHT BEARING RESTRICTIONS: No  PAIN:  Are you having pain? Yes: NPRS scale: 3/10 Pain location: Lt thumb, Rt thumb Pain description: achy Aggravating factors: movement Relieving factors: hot water  FALLS: Has patient fallen in last 6 months? 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 , and Leisure: fishing, playing with grandchildren  PATIENT GOALS: strengthen Lt thumb, decrease pain/symptoms Rt thumb  NEXT MD VISIT: none  OBJECTIVE:   HAND DOMINANCE: Right  ADLs:  Eating: difficulty cutting food Grooming: mod I  UB Dressing: mod I w/ pullover shirts LB Dressing: difficulty buttoning pants, getting laces tight Toileting: mod I Bathing: mod I  Tub Shower transfers: independent **Difficulty giving himself insulin, opening jars/tight containers, and pants buttons  FUNCTIONAL OUTCOME MEASURES: Quick Dash: 61% disabled in hands  UPPER EXTREMITY ROM:   BUE AROM WNL's except thumbs - opposes to 4th digit only bilaterally, thumb MP flexion limited bilaterally (Lt worse than Rt)    Active ROM Right eval Left eval  Thumb MCP (0-60) 30* 15*  Thumb IP (0-80)    Thumb Radial abd/add (0-55) 75 60   Thumb Palmar abd/add (0-45) 65 60   Thumb Opposition to Small Finger     Index MCP (0-90)     Index PIP (0-100)     Index DIP (0-70)      Long MCP (0-90)      Long PIP (0-100)      Long DIP (0-70)      Ring MCP (0-90)      Ring PIP (0-100)      Ring DIP (0-70)      Little MCP (0-90)      Little PIP (0-100)      Little DIP (0-70)      (Blank rows = not tested)    HAND FUNCTION: Grip strength: Right: 44.5 lbs; Left: 18.5 lbs,  Lateral pinch: Right: 12 lbs, Left: 7 lbs, and  3 point pinch: Right: 6 lbs, Left: 5 lbs  COORDINATION: 9 Hole Peg test: Right: 20.97 sec; Left: 21 sec  SENSATION: Pt reports Rt LF goes numb and swells up at times  EDEMA: very minimal Rt fingers  COGNITION: Overall cognitive status: Within functional limits for tasks assessed  OBSERVATIONS: Pt w/ more limited Lt thumb ROM d/t CMC arthroplasty,  however Rt thumb has begun bothering him   TODAY'S TREATMENT:              - Therapeutic activities  Patient did bring his insulin and demonstrated the difficulty he was having with pinching and removing the cover as well as pressing the plunger for injection. As patient had brought his putty, therapist reviewed exercises and activities (with images provided) that he could do to improve these areas, including thumb flexion, i.e. pressing into a cylinder of putty and isolating, some sustained pinches by pinching and pulling putty. Activities - Thumb MCP and IP Flexion with Putty  - 2 x daily - 10 reps - Tip PUSH with Putty  - 2 x daily - 10  reps  Patient engaged in coordination HEP activities with demonstration, instruction and some tactile cues to improve accuracy of tasks. Patient provided return demonstration of various activities and visual handout is provided with images to assist with carryover at home.   Coordination Activities  Perform the following activities for both hand(s).  Rotate ball in fingertips (clockwise and counter-clockwise). Flip cards 1 at a time as fast as you can. Deal cards with your thumb (Hold deck in hand and push card off top with thumb). Shuffle cards. Pick up coins, buttons, marbles, dried beans/pasta of different sizes and place in container. Pick up coins and place in container or coin bank. Pick up coins and stack. Pick up coins one at a time until you get 5-10 in your hand, then move coins from palm to fingertips to stack one at a time. Twirl pen between fingers.   PATIENT EDUCATION: Education details: A/ROM and putty HEP for bilateral thumbs Person educated: Patient Education method: Explanation, Demonstration, Tactile cues, Verbal cues, and Handouts Education comprehension: verbalized understanding, returned demonstration, verbal cues required, tactile cues required, and needs further education  HOME EXERCISE PROGRAM: 07/04/22: AROM and putty HEP  for bilateral thumbs 07/25/22: Access Code: [email or web address] URL: [email or web address] Date: 07/24/2022 Prepared by: Amada Kingfisher    GOALS: Goals reviewed with patient? Yes  SHORT TERM GOALS: Target date: 07/25/22  Independent with HEP for bilateral grip and pinch strength Baseline: Goal status: IN PROGRESS  2.  Pt to verbalize understanding with pain management strategies including use of modalities, joint protection strategies, task modifications, and splinting needs for bilateral thumbs Baseline:  Goal status: IN PROGRESS  3.  Pt to verbalize understanding with potential A/E needs like jar openers, button hook, and for cutting food, etc.  Baseline:  Goal status: IN PROGRESS  4.  Pt will verbalize understanding to minimize symptoms of Rt long finger trigger finger Baseline:  Goal status: IN PROGRESS    LONG TERM GOALS: Target date: 08/24/22  Pt will improve grip strength Lt hand to 25 lbs to open jars/containers w/ greater ease Baseline: 18.5 lbs Goal status: IN PROGRESS  2.  Pt to improve Lt lateral pinch to 9 lbs or greater for functional pinching Baseline: 7 lbs Goal status: IN PROGRESS  3.  Pt to improve bilateral 3 tip pinch by 2 lbs or greater  Baseline:  Rt = 6 lbs, Lt = 5 lbs Goal status: IN PROGRESS  4.  Pt to improve overall function as evidenced by scoring 50% or less on Quick Dash  Baseline: 61% Goal status: INITIAL    ASSESSMENT:  CLINICAL IMPRESSION: Pt demonstrates good understanding of progressing HEP as needed to improve functional ROM, strength and coordination.  Patient continues to benefit from skilled occupational therapy services to improve active range of motion, strength and coordination for functional daily activities including increased ease with managing diabetes medication.   PERFORMANCE DEFICITS: in functional skills including ADLs, IADLs, sensation, edema, ROM, strength, pain, body mechanics, decreased knowledge of use of DME,  and UE functional use,.   IMPAIRMENTS: are limiting patient from ADLs, IADLs, work, and leisure.   COMORBIDITIES: may have co-morbidities  that affects occupational performance. Patient will benefit from skilled OT to address above impairments and improve overall function.  REHAB POTENTIAL: Good   PLAN:  OT FREQUENCY: 1x/week  OT DURATION: 8 weeks (plus eval)  PLANNED INTERVENTIONS: self care/ADL training, therapeutic exercise, therapeutic activity, manual therapy, passive range of motion, splinting, ultrasound, paraffin, fluidotherapy, moist heat, cryotherapy, contrast  bath, patient/family education, coping strategies training, and DME and/or AE instructions  RECOMMENDED OTHER SERVICES: none at this time  CONSULTED AND AGREED WITH PLAN OF CARE: Patient  PLAN FOR NEXT SESSION:   Progress note for additional visits after 5th visit per Medicaid Auth.  Review and Progress HEPs - putty/coordination HEP, A/E needs; review insulin administration s/p exercises provided to work on aspects he was having difficulty with.    Victorino Sparrow, OT 07/24/2022, 5:05 PM

## 2022-07-24 NOTE — Patient Instructions (Addendum)
Access Code: [email or web address] URL: [email or web address] Date: 07/24/2022 Prepared by: Amada Kingfisher  Exercises - Thumb MCP and IP Flexion with Putty  - 2 x daily - 10 reps - Tip PUSH with Putty  - 2 x daily - 10 reps

## 2022-07-30 NOTE — Telephone Encounter (Signed)
F/u imaging ordered.

## 2022-07-31 ENCOUNTER — Ambulatory Visit: Payer: 59 | Admitting: Occupational Therapy

## 2022-07-31 ENCOUNTER — Encounter: Payer: Self-pay | Admitting: Occupational Therapy

## 2022-07-31 DIAGNOSIS — M25642 Stiffness of left hand, not elsewhere classified: Secondary | ICD-10-CM

## 2022-07-31 DIAGNOSIS — M25542 Pain in joints of left hand: Secondary | ICD-10-CM

## 2022-07-31 DIAGNOSIS — M6281 Muscle weakness (generalized): Secondary | ICD-10-CM

## 2022-07-31 DIAGNOSIS — M79644 Pain in right finger(s): Secondary | ICD-10-CM

## 2022-07-31 NOTE — Therapy (Signed)
OUTPATIENT OCCUPATIONAL THERAPY ORTHO TREATMENT  Patient Name: Daniel Stevenson MRN: [id number] DOB:[birth year], 57 y.o., male Today's Date: 07/31/2022  PCP: Nicoletta Ba, Rexene Edison REFERRING PROVIDER: Gomez Cleverly, MD  END OF SESSION:  OT End of Session - 07/31/22 1102     Visit Number 5    Number of Visits 9    Date for OT Re-Evaluation 08/24/22    Authorization Type Jari Favre MCD - requires auth after 5th visit    Authorization - Visit Number 4    Authorization - Number of Visits 5    OT Start Time 1100    OT Stop Time 1150    OT Time Calculation (min) 50 min    Equipment Utilized During Treatment putty and FM objects    Activity Tolerance Patient tolerated treatment well;No increased pain    Behavior During Therapy [facility] for tasks assessed/performed              Past Medical History:  Diagnosis Date   Back pain    s/p back surgery   Carpal tunnel syndrome on both sides    Chronic hyponatremia 08/2012 onset   Onset was right after starting amitriptyline for his neuropathic leg pain--has been stable.  Likely ADH-like effect on kidney from amitriptyline.   COVID-19 virus infection 09/03/2020   History of low back pain 06/24/2011   Hyperlipidemia    Hypothyroidism    LADA (latent autoimmune diabetes in adults), managed as type 1 (HCC) Dx'd 2014   Dr. Morrison Old.   Migraines    Neuropathic pain of right lower extremity    Osteoarthritis of hands, bilateral    Osteoarthritis of wrists, bilateral    PLANTAR WART, RIGHT 10/09/2009   Snoring    Sleep study neg  OSA 04/2018   Tobacco dependence    quit 2012; restarted not long after   Past Surgical History:  Procedure Laterality Date   BACK SURGERY  02/12/2007   lower lumbar fusion- rubber disc in back   CARDIOVASCULAR STRESS TEST  03/21/2014   No ischemia/low risk study; however, LV function showed EF 49% and global LV hypokinesis--echo recommended and it showed normal LV fxn.   CARPAL TUNNEL RELEASE     bilat 2023/24    NASAL SINUS SURGERY  02/11/2005   Polysomnogram  04/2018   NO OSA (Dr. Frances Furbish)   TRANSTHORACIC ECHOCARDIOGRAM  03/24/2014   Normal LV fxn, EF 50-55%, no wall motion abnormalities: (grade 1 diast dysfxn)   WRIST SURGERY     2023/24 L wrist [facility] arthroplasty   Patient Active Problem List   Diagnosis Date Noted   Carpal tunnel syndrome of left wrist 08/13/2021   Other symptoms and signs involving cognitive functions and awareness 06/30/2019   Lymphadenitis, acute 05/02/2015   Headache 01/10/2015   Fatigue 01/10/2015   Allergic rhinitis 01/10/2015   Medication intolerance 09/06/2013   Health maintenance examination 07/13/2013   Type II or unspecified type diabetes mellitus without mention of complication, uncontrolled 01/18/2013   GERD (gastroesophageal reflux disease) 01/18/2013   Right leg pain 08/18/2012   Ganglion cyst of flexor tendon sheath 08/05/2011   History of low back pain 06/24/2011   Tobacco dependence 11/20/2010   Hypothyroidism 10/09/2009    ONSET DATE: 06/18/2022  REFERRING DIAG: M18.12 (ICD-10-CM) - Unilateral primary osteoarthritis of first carpometacarpal joint, left hand  THERAPY DIAG:  Pain in thumb joint with movement of left hand  Muscle weakness (generalized)  Stiffness of left hand, not elsewhere classified  Pain in right finger(s)  Rationale for Evaluation and Treatment: Rehabilitation  SUBJECTIVE:   SUBJECTIVE STATEMENT:  I'm going on vacation next week so I think we can just wrap up today. I'm also getting better at administering my insulin  Pt accompanied by: self  PERTINENT HISTORY: DMT1, hypothyroidism, and chronic pain syndrome. Status post bilateral carpal tunnel surgery 08/2021 and 11/2021, s/p Lt [facility] arthroplasty 01/2022  PRECAUTIONS: Other: previous back surgeries  WEIGHT BEARING RESTRICTIONS: No  PAIN:  Are you having pain? Yes: NPRS scale: 3/10 Pain location: Lt thumb, Rt thumb Pain description: achy Aggravating factors:  movement Relieving factors: hot water  FALLS: Has patient fallen in last 6 months? 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 , and Leisure: fishing, playing with grandchildren  PATIENT GOALS: strengthen Lt thumb, decrease pain/symptoms Rt thumb  NEXT MD VISIT: none  OBJECTIVE:   HAND DOMINANCE: Right  ADLs:  Eating: difficulty cutting food Grooming: mod I  UB Dressing: mod I w/ pullover shirts LB Dressing: difficulty buttoning pants, getting laces tight Toileting: mod I Bathing: mod I  Tub Shower transfers: independent **Difficulty giving himself insulin, opening jars/tight containers, and pants buttons  FUNCTIONAL OUTCOME MEASURES: Quick Dash: 61% disabled in hands  UPPER EXTREMITY ROM:   BUE AROM WNL's except thumbs - opposes to 4th digit only bilaterally, thumb MP flexion limited bilaterally (Lt worse than Rt)    Active ROM Right eval Left eval  Thumb MCP (0-60) 30* 15*  Thumb IP (0-80)    Thumb Radial abd/add (0-55) 75 60   Thumb Palmar abd/add (0-45) 65 60   Thumb Opposition to Small Finger     Index MCP (0-90)     Index PIP (0-100)     Index DIP (0-70)      Long MCP (0-90)      Long PIP (0-100)      Long DIP (0-70)      Ring MCP (0-90)      Ring PIP (0-100)      Ring DIP (0-70)      Little MCP (0-90)      Little PIP (0-100)      Little DIP (0-70)      (Blank rows = not tested)    HAND FUNCTION: Grip strength: Right: 44.5 lbs; Left: 18.5 lbs,  Lateral pinch: Right: 12 lbs, Left: 7 lbs, and  3 point pinch: Right: 6 lbs, Left: 5 lbs  COORDINATION: 9 Hole Peg test: Right: 20.97 sec; Left: 21 sec  SENSATION: Pt reports Rt LF goes numb and swells up at times  EDEMA: very minimal Rt fingers  COGNITION: Overall cognitive status: Within functional  limits for tasks assessed  OBSERVATIONS: Pt w/ more limited Lt thumb ROM d/t CMC arthroplasty, however Rt thumb has begun bothering him   TODAY'S TREATMENT:              See below pt education Assessed goals and progress to date - see goal section Quick Dash 45%   PATIENT EDUCATION: Education details: A/E recommendations (jar openers, button extender, ergonomic knives), task modifications and joint protection techniques, trigger finger splint, paraffin machine Person educated: Patient Education method: Explanation, Demonstration, and Handouts Education comprehension: verbalized understanding  HOME EXERCISE PROGRAM: 07/04/22: AROM and putty HEP for bilateral thumbs 07/25/22: Access Code: [email or web address] URL: [email or web address] Date: 07/24/2022 Prepared by: Amada Kingfisher  GOALS: Goals reviewed with patient? Yes  SHORT TERM GOALS: Target date: 07/25/22  Independent with HEP for bilateral grip and pinch strength Baseline: Goal status: MET  2.  Pt to verbalize understanding with pain management strategies including use of modalities, joint protection strategies, task modifications, and splinting needs for bilateral thumbs Baseline:  Goal status: MET  3.  Pt to verbalize understanding with potential A/E needs like jar openers, button hook, and for cutting food, etc.  Baseline:  Goal status: MET  4.  Pt will verbalize understanding to minimize symptoms of Rt long finger trigger finger Baseline:  Goal status: MET     LONG TERM GOALS: Target date: 08/24/22  Pt will improve grip strength Lt hand to 25 lbs to open jars/containers w/ greater ease Baseline: 18.5 lbs Goal status: MET (26.2 LBS)   2.  Pt to improve Lt lateral pinch to 9 lbs or greater for functional pinching Baseline: 7 lbs Goal status: MET (9 LBS)   3.  Pt to improve bilateral 3 tip pinch by 2 lbs or greater  Baseline:  Rt = 6 lbs, Lt = 5 lbs Goal status: PARTIALLY MET (Rt = 10 lbs, Lt = 6 lbs)    4.  Pt to improve overall function as evidenced by scoring 50% or less on Quick Dash  Baseline: 61% Goal status: MET (45.45%)     ASSESSMENT:  CLINICAL IMPRESSION: Pt has met all goals and shows improvement in both grip and pinch strength as well as functional tasks.   PERFORMANCE DEFICITS: in functional skills including ADLs, IADLs, sensation, edema, ROM, strength, pain, body mechanics, decreased knowledge of use of DME, and UE functional use,.   IMPAIRMENTS: are limiting patient from ADLs, IADLs, work, and leisure.   COMORBIDITIES: may have co-morbidities  that affects occupational performance. Patient will benefit from skilled OT to address above impairments and improve overall function.  REHAB POTENTIAL: Good   PLAN:  OT FREQUENCY: 1x/week  OT DURATION: 8 weeks (plus eval)  PLANNED INTERVENTIONS: self care/ADL training, therapeutic exercise, therapeutic activity, manual therapy, passive range of motion, splinting, ultrasound, paraffin, fluidotherapy, moist heat, cryotherapy, contrast bath, patient/family education, coping strategies training, and DME and/or AE instructions  RECOMMENDED OTHER SERVICES: none at this time  CONSULTED AND AGREED WITH PLAN OF CARE: Patient  PLAN D/C O.T.   OCCUPATIONAL THERAPY DISCHARGE SUMMARY  Visits from Start of Care: 5  Current functional level related to goals / functional outcomes: SEE ABOVE   Remaining deficits: Strength in hands (grip and pinch) but has improved Pain   Education / Equipment: Task modifications, joint protection, A/E needs, HEP's, recommended brace/splint   Patient agrees to discharge. Patient goals were met. Patient is being discharged due to the patient's request..      Sheran Lawless, OT 07/31/2022, 12:04 PM

## 2022-08-01 ENCOUNTER — Other Ambulatory Visit: Payer: Self-pay | Admitting: Family Medicine

## 2022-08-01 DIAGNOSIS — M899 Disorder of bone, unspecified: Secondary | ICD-10-CM

## 2022-08-02 ENCOUNTER — Ambulatory Visit (HOSPITAL_BASED_OUTPATIENT_CLINIC_OR_DEPARTMENT_OTHER): Admission: RE | Admit: 2022-08-02 | Payer: 59 | Source: Ambulatory Visit

## 2022-08-06 NOTE — Telephone Encounter (Signed)
PA Denied.   We received a request for Lantus SoloStar. Our records show that you were denied the requested drug on 07/11/2022. No new information was submitted with this request. Please have your doctor submit additional records to [place]. Please speak to your doctor about your options for care.  E-appeal is available in [facility]

## 2022-08-11 ENCOUNTER — Other Ambulatory Visit: Payer: Self-pay | Admitting: Family Medicine

## 2022-08-12 MED ORDER — OXYCODONE HCL 5 MG PO TABS: ORAL_TABLET | ORAL | 0 refills | Status: DC

## 2022-08-19 ENCOUNTER — Other Ambulatory Visit (HOSPITAL_BASED_OUTPATIENT_CLINIC_OR_DEPARTMENT_OTHER): Payer: 59

## 2022-08-25 ENCOUNTER — Ambulatory Visit (HOSPITAL_BASED_OUTPATIENT_CLINIC_OR_DEPARTMENT_OTHER)
Admission: RE | Admit: 2022-08-25 | Discharge: 2022-08-25 | Disposition: A | Discharge status: Home / Self Care | Payer: 59 | Source: Ambulatory Visit | Attending: Family Medicine | Admitting: Family Medicine

## 2022-08-25 DIAGNOSIS — M899 Disorder of bone, unspecified: Secondary | ICD-10-CM | POA: Diagnosis present

## 2022-08-25 MED ORDER — GADOBUTROL 1 MMOL/ML IV SOLN
7.0000 mL | Freq: Once | INTRAVENOUS | Status: AC | PRN
  Administered 2022-08-25: 7 mL via INTRAVENOUS

## 2022-08-29 ENCOUNTER — Encounter: Payer: Self-pay | Admitting: Family Medicine

## 2022-09-02 ENCOUNTER — Encounter: Payer: Self-pay | Admitting: Family Medicine

## 2022-09-03 ENCOUNTER — Other Ambulatory Visit: Payer: Self-pay | Admitting: Family Medicine

## 2022-09-04 NOTE — Telephone Encounter (Signed)
Neck arthritis can certainly cause headaches that affect the back of the head. I still do not know what is causing your facial numbness. Please make a appointment to recheck with me in the office and we will go over things in come up with the neck step. --PM

## 2022-09-10 ENCOUNTER — Other Ambulatory Visit: Payer: Self-pay | Admitting: Family Medicine

## 2022-09-11 ENCOUNTER — Telehealth: Payer: Self-pay

## 2022-09-11 ENCOUNTER — Other Ambulatory Visit: Payer: Self-pay | Admitting: Family Medicine

## 2022-09-11 MED ORDER — OXYCODONE HCL 5 MG PO TABS: ORAL_TABLET | ORAL | 0 refills | Status: DC

## 2022-09-11 NOTE — Telephone Encounter (Signed)
Daniel Stevenson (Key: [id number])  Need Help? Call us at [phone]  Outcome: Additional Information Required This drug is not available to be refilled yet due to a recent fill. No authorization is required. Please contact the health plan with any questions.  Drug: oxyCODONE HCl 5MG  tablets  Form: Ambulance person [place] Form [phone] NCP[facility])

## 2022-09-18 ENCOUNTER — Encounter: Payer: Self-pay | Admitting: Family Medicine

## 2022-09-18 ENCOUNTER — Ambulatory Visit (INDEPENDENT_AMBULATORY_CARE_PROVIDER_SITE_OTHER): Payer: 59 | Admitting: Family Medicine

## 2022-09-18 VITALS — BP 138/84 | HR 77 | Wt 155.0 lb

## 2022-09-18 DIAGNOSIS — E039 Hypothyroidism, unspecified: Secondary | ICD-10-CM

## 2022-09-18 DIAGNOSIS — M19041 Primary osteoarthritis, right hand: Secondary | ICD-10-CM

## 2022-09-18 DIAGNOSIS — L03011 Cellulitis of right finger: Secondary | ICD-10-CM

## 2022-09-18 DIAGNOSIS — E139 Other specified diabetes mellitus without complications: Secondary | ICD-10-CM | POA: Diagnosis not present

## 2022-09-18 DIAGNOSIS — G894 Chronic pain syndrome: Secondary | ICD-10-CM | POA: Diagnosis not present

## 2022-09-18 DIAGNOSIS — Z79899 Other long term (current) drug therapy: Secondary | ICD-10-CM

## 2022-09-18 DIAGNOSIS — M19042 Primary osteoarthritis, left hand: Secondary | ICD-10-CM

## 2022-09-18 LAB — BASIC METABOLIC PANEL
BUN: 13 mg/dL (ref 6–23)
CO2: 30 mEq/L (ref 19–32)
Calcium: 8.9 mg/dL (ref 8.4–10.5)
Chloride: 92 mEq/L — ABNORMAL LOW (ref 96–112)
Creatinine, Ser: 1.07 mg/dL (ref 0.40–1.50)
GFR: 76.94 mL/min (ref >60.00)
Glucose, Bld: 166 mg/dL — ABNORMAL HIGH (ref 70–99)
Potassium: 4.7 mEq/L (ref 3.5–5.1)
Sodium: 128 mEq/L — ABNORMAL LOW (ref 135–145)

## 2022-09-18 LAB — TSH: TSH: 3.03 u[IU]/mL (ref 0.35–5.50)

## 2022-09-18 LAB — HEMOGLOBIN A1C: Hgb A1c MFr Bld: 7.8 % — ABNORMAL HIGH (ref 4.6–6.5)

## 2022-09-18 MED ORDER — PANTOPRAZOLE SODIUM 40 MG PO TBEC: 40.0000 mg | DELAYED_RELEASE_TABLET | Freq: Every day | ORAL | 1 refills | Status: DC

## 2022-09-18 MED ORDER — CEPHALEXIN 500 MG PO CAPS: 500.0000 mg | ORAL_CAPSULE | Freq: Three times a day (TID) | ORAL | 0 refills | Status: DC

## 2022-09-18 NOTE — Progress Notes (Signed)
OFFICE VISIT  09/18/2022  CC:  Chief Complaint  Patient presents with   Hand Pain    2 weeks; Pt states he thought it was a hang nail, tried to scrape it out, but caused things to get worse. Pt states thumb is irritated, painful, and puss has been coming from infected area. Pt states he feels it has not gotten any better.     Patient is a 57 y.o. male who presents for right thumb concern. Also follow-up diabetes, hypothyroidism, and chronic pain syndrome.  HPI: About 2 weeks ago got swelling and redness at the periphery of the nail of his right thumb.  He took a needle and punctured it and squeezed out some fluid.  It does hurt a mild amount.  It has improved but not completely resolved so he wanted to get it checked out. No fever.  No redness other than around the nail.  Chronic pain: Chronic pain of both hands and wrists due to severe osteoarthritis is stable.  He is actually using a little less oxycodone than he had been earlier this year.  Average 2 tabs a day. PMP AWARE reviewed today: most recent rx for oxycodone 5 mg was filled 09/11/2022, # 90, rx by me. No red flags.  Current Lantus dosing is 20 units a day, current NovoLog dosing is anywhere from 5 to 8 units with each meal. He has rare hypoglycemia.  He does have some glucoses in the 200s but is not regular.  DL has intermittent but frequent episodes of tingling and numbness sensation on the right side of his face and the right side of his neck.  Denies facial weakness, shoulders weakness, or arms weakness.  ROS as above, plus--> no fevers, no CP, no SOB, no wheezing, no cough, no dizziness, no HAs, no rashes, no melena/hematochezia.  No polyuria or polydipsia.  No myalgias or arthralgias.  No focal weakness, no tremors.  No acute vision or hearing abnormalities.  No dysuria or unusual/new urinary urgency or frequency.  No recent changes in lower legs. No n/v/d or abd pain.  No palpitations.     Past Medical History:   Diagnosis Date   Back pain    s/p back surgery   Carpal tunnel syndrome on both sides    Cervical spinal stenosis    multilevel (CT 08/2022)   History of low back pain 06/24/2011   Hyperlipidemia    Hypothyroidism    LADA (latent autoimmune diabetes in adults), managed as type 1 (HCC) Dx'd 2014   Dr. Morrison Old.   Migraines    Neuropathic pain of right lower extremity    Osteoarthritis of hands, bilateral    Osteoarthritis of wrists, bilateral    PLANTAR WART, RIGHT 10/09/2009   Snoring    Sleep study neg  OSA 04/2018   Tobacco dependence    quit 2012; restarted not long after    Past Surgical History:  Procedure Laterality Date   BACK SURGERY  02/12/2007   lower lumbar fusion- rubber disc in back   CARDIOVASCULAR STRESS TEST  03/21/2014   No ischemia/low risk study; however, LV function showed EF 49% and global LV hypokinesis--echo recommended and it showed normal LV fxn.   CARPAL TUNNEL RELEASE     bilat 2023/24   NASAL SINUS SURGERY  02/11/2005   Polysomnogram  04/2018   NO OSA (Dr. Frances Furbish)   TRANSTHORACIC ECHOCARDIOGRAM  03/24/2014   Normal LV fxn, EF 50-55%, no wall motion abnormalities: (grade 1 diast dysfxn)  WRIST SURGERY     2023/24 L wrist [facility] arthroplasty    Outpatient Medications Prior to Visit  Medication Sig Dispense Refill   amitriptyline (ELAVIL) 25 MG tablet TAKE ONE TABLET BY MOUTH EVERY MORNING AND TAKE THREE TABLETS AT BEDTIME for leg pain 360 tablet 1   atorvastatin (LIPITOR) 20 MG tablet Take 1 tablet (20 mg total) by mouth daily. 90 tablet 3   buPROPion (WELLBUTRIN XL) 150 MG 24 hr tablet Take 1 tablet (150 mg total) by mouth daily. 90 tablet 3   citalopram (CELEXA) 40 MG tablet Take 1 tablet (40 mg total) by mouth daily. 90 tablet 3   Continuous Glucose Sensor (FREESTYLE LIBRE 2 SENSOR) MISC APPLY ROUTE EVERY 14 DAYS. 2 each 5   fluticasone (FLONASE) 50 MCG/ACT nasal spray Place 2 sprays into both nostrils daily. 16 g 3   insulin aspart (NOVOLOG  FLEXPEN) 100 UNIT/ML FlexPen INJECT 15 UNITS NINTO THE SKIN BEFORE EVERY A MEAL 15 mL 1   insulin glargine (LANTUS SOLOSTAR) 100 UNIT/ML Solostar Pen Inject 20 Units into the skin daily. 15 mL 1   Insulin Pen Needle (SURE COMFORT PEN NEEDLES) 32G X 4 MM MISC USE FOUR TIMES DAILY AS NEEDED 100 each 10   levothyroxine (SYNTHROID) 137 MCG tablet Take 1-1/2 tabs on Mondays Wednesdays and Fridays and 1 tab all other days. 96 tablet 0   losartan (COZAAR) 100 MG tablet Take 1 tablet (100 mg total) by mouth daily. 90 tablet 1   meloxicam (MOBIC) 15 MG tablet Take 1 tablet (15 mg total) by mouth daily. 30 tablet 5   metoprolol succinate (TOPROL-XL) 25 MG 24 hr tablet TAKE ONE TABLET BY MOUTH EVERY DAY 30 tablet 2   oxyCODONE (OXY IR/ROXICODONE) 5 MG immediate release tablet 1-2 tabs po qid prn 90 tablet 0   sildenafil (VIAGRA) 100 MG tablet Take 1 tablet (100 mg total) by mouth daily as needed for erectile dysfunction. 10 tablet 11   tamsulosin (FLOMAX) 0.4 MG CAPS capsule Take 1 capsule (0.4 mg total) by mouth daily after supper. 90 capsule 1   pantoprazole (PROTONIX) 40 MG tablet Take 1 tablet (40 mg total) by mouth daily. 90 tablet 1   No facility-administered medications prior to visit.    Allergies  Allergen Reactions   Amlodipine Other (See Comments)    Headache   Augmentin [Amoxicillin-Pot Clavulanate] Nausea And Vomiting   Oxycodone-Acetaminophen     REACTION: itching   Levothyroxine Rash    Had rash associated with generic levothyroxine, but not brand-name Synthroid.  Likely allergic to a dye or filler in the generic preparation.   Lisinopril Cough    Review of Systems  As per HPI  PE:    09/18/2022   10:30 AM 09/18/2022   10:23 AM 07/10/2022    2:24 PM  Vitals with BMI  Weight  155 lbs   Systolic 138 148 409  Diastolic 84 82 88  Pulse  77      Physical Exam  Gen: Alert, well appearing.  Patient is oriented to person, place, time, and situation. AFFECT: pleasant, lucid  thought and speech. R thumb with deep pink hue and superficial desquamation of the skin on ulnar side of the nail starting at the nail fold and extending about 1 cm.  This is soft but not fluctuant.  No translucent or white areas.  Mildly TTP.   LABS:  Last metabolic panel Lab Results  Component Value Date   GLUCOSE 124 (H) 06/17/2022  NA 131 (L) 06/17/2022   K 4.1 06/17/2022   CL 92 (L) 06/17/2022   CO2 30 06/17/2022   BUN 8 06/17/2022   CREATININE 0.85 06/17/2022   GFR 96.51 06/17/2022   CALCIUM 9.4 06/17/2022   PROT 6.7 06/17/2022   ALBUMIN 4.1 06/17/2022   BILITOT 0.8 06/17/2022   ALKPHOS 145 (H) 06/17/2022   AST 15 06/17/2022   ALT 16 06/17/2022   ANIONGAP 10 09/15/2018   Lab Results  Component Value Date   HGBA1C 6.9 (A) 06/17/2022   HGBA1C 6.9 06/17/2022   HGBA1C 6.9 (A) 06/17/2022   HGBA1C 6.9 06/17/2022   Lab Results  Component Value Date   TSH 0.01 (L) 06/17/2022   Lab Results  Component Value Date   VITAMINB12 >1500 (H) 03/31/2015   IMPRESSION AND PLAN:  #1 right thumb paronychia. Slow to completely resolved.  No definite loculation is palpable or visible. Will do Keflex 500 mg 3 times daily x 7 days.  Continue Epson salt soaking daily.  #2 LADA, control is pretty good. Check Hba1c today.  #3 hypothyroidism. TSH was low last check about 3 months ago. I had him hold his levothyroxine for 3 days and then restarted at 1-1/2 of the 137 mcg tabs on 2 days a week and 1 tab all other days. Monitor TSH today.  #4 chronic pain syndrome. Bilateral hand osteoarthritis. Stable. Controlled substance contract updated. Will do urine drug screen next visit.  #5 intermittent right facial and neck paresthesias.  Unknown etiology but could be related to peripheral nerve ischemia from diabetes. Dr. Epimenio Foot reviewed patient's MRI and believes the changes are far more consistent with fairly mild chronic microvascular ischemic change due to his diabetes and  hypertension rather then demyelination. He doesn't feel he'll be able to add anything to his care An After Visit Summary was printed and given to the patient.  FOLLOW UP: Return in about 3 months (around 12/19/2022) for routine chronic illness f/u. Next CPE 06/2023 Signed:  Santiago Bumpers, MD           09/18/2022

## 2022-09-23 ENCOUNTER — Other Ambulatory Visit: Payer: Self-pay | Admitting: Family Medicine

## 2022-09-24 ENCOUNTER — Ambulatory Visit: Payer: 59 | Admitting: Family Medicine

## 2022-09-24 ENCOUNTER — Telehealth: Payer: Self-pay

## 2022-09-24 NOTE — Telephone Encounter (Signed)
Noted, nothing has been received regarding this yet.

## 2022-09-24 NOTE — Telephone Encounter (Signed)
Patient states someone called him today, stating he would have to change insulin to another brand because of his insurance.  They told patient they were going to fax our office with the new info regarding medication they will accept.  I told patient I would make note in his chart.

## 2022-09-25 MED ORDER — BASAGLAR TEMPO PEN 100 UNIT/ML ~~LOC~~ SOPN: PEN_INJECTOR | SUBCUTANEOUS | 3 refills | Status: DC

## 2022-09-25 NOTE — Telephone Encounter (Signed)
OK, basaglar rx'.  Same dose-->22 units once a day

## 2022-09-25 NOTE — Telephone Encounter (Signed)
Daniel Stevenson (Daniel Stevenson) PA Case ID #: [id number] Rx #: [id number]  PA submitted 8/13, denied as of 8/13 due to not meeting plan's medical necessity criteria for non-formulary drugs. Pt must try Basaglar or [place].  Please further advise.

## 2022-09-25 NOTE — Telephone Encounter (Signed)
Pt advised of recommendations.  

## 2022-10-02 ENCOUNTER — Other Ambulatory Visit: Payer: Self-pay | Admitting: Family Medicine

## 2022-10-10 ENCOUNTER — Other Ambulatory Visit: Payer: Self-pay | Admitting: Family Medicine

## 2022-10-10 MED ORDER — OXYCODONE HCL 5 MG PO TABS: ORAL_TABLET | ORAL | 0 refills | Status: DC

## 2022-10-10 NOTE — Telephone Encounter (Signed)
Refill requested for Oxycodone sent to [facility] next OV 11/7

## 2022-10-15 ENCOUNTER — Encounter: Payer: Self-pay | Admitting: Family Medicine

## 2022-10-16 ENCOUNTER — Other Ambulatory Visit: Payer: Self-pay | Admitting: Family Medicine

## 2022-10-16 NOTE — Telephone Encounter (Signed)
Placed on PCP desk for signature. 

## 2022-11-04 ENCOUNTER — Other Ambulatory Visit: Payer: Self-pay | Admitting: Family Medicine

## 2022-11-04 DIAGNOSIS — E139 Other specified diabetes mellitus without complications: Secondary | ICD-10-CM

## 2022-11-10 ENCOUNTER — Encounter: Payer: Self-pay | Admitting: Family Medicine

## 2022-11-10 ENCOUNTER — Other Ambulatory Visit: Payer: Self-pay | Admitting: Family Medicine

## 2022-11-12 ENCOUNTER — Telehealth: Payer: Self-pay

## 2022-11-12 MED ORDER — OXYCODONE HCL 5 MG PO TABS: ORAL_TABLET | ORAL | 0 refills | Status: DC

## 2022-11-13 ENCOUNTER — Telehealth: Payer: Self-pay

## 2022-11-13 ENCOUNTER — Other Ambulatory Visit (HOSPITAL_COMMUNITY): Payer: Self-pay

## 2022-11-13 NOTE — Telephone Encounter (Signed)
Pharmacy Patient Advocate Encounter   Received notification from Pt Calls Messages that prior authorization for oxyCODONE HCl 5MG  tablets is required/requested.   Insurance verification completed.   The patient is insured through [facility] .   Per test claim: PA required; PA started via CoverMyMeds. KEY [id number] . Waiting for clinical questions to populate.

## 2022-11-15 ENCOUNTER — Other Ambulatory Visit: Payer: Self-pay | Admitting: Family Medicine

## 2022-11-15 DIAGNOSIS — Z1211 Encounter for screening for malignant neoplasm of colon: Secondary | ICD-10-CM

## 2022-11-15 DIAGNOSIS — Z1212 Encounter for screening for malignant neoplasm of rectum: Secondary | ICD-10-CM

## 2022-11-15 NOTE — Telephone Encounter (Signed)
Clinical questions answered. PA submitted

## 2022-11-18 NOTE — Telephone Encounter (Signed)
Pharmacy Patient Advocate Encounter  Received notification from  [employer]  that Prior Authorization for oxyCODONE HCl 5MG  tablets has been DENIED.  Full denial letter will be uploaded to the media tab. See denial reason below.   PA #/Case ID/Reference #:  [id number]   DENIAL REASON:      Please be advised we currently do not have a Pharmacist to review denials, therefore you will need to process appeals accordingly as needed. Thanks for your support at this time. Contact for appeals are as follows: Phone: [phone], Fax: [phone]

## 2022-11-19 NOTE — Telephone Encounter (Signed)
Pt confirmed he picked up the Oxycodone prescription, nothing further needed at this time.

## 2022-11-29 ENCOUNTER — Encounter: Payer: Self-pay | Admitting: Family Medicine

## 2022-11-29 DIAGNOSIS — E139 Other specified diabetes mellitus without complications: Secondary | ICD-10-CM

## 2022-12-02 ENCOUNTER — Other Ambulatory Visit: Payer: Self-pay

## 2022-12-02 DIAGNOSIS — E139 Other specified diabetes mellitus without complications: Secondary | ICD-10-CM

## 2022-12-09 MED ORDER — SURE COMFORT PEN NEEDLES 32G X 4 MM MISC
5 refills | Status: DC
Start: 2022-12-09 — End: 2023-07-03

## 2022-12-09 MED ORDER — FREESTYLE LIBRE 2 SENSOR MISC
5 refills | Status: DC
Start: 2022-12-09 — End: 2023-05-19

## 2022-12-09 NOTE — Telephone Encounter (Signed)
Patient would like prescription called into [facility]

## 2022-12-11 ENCOUNTER — Other Ambulatory Visit: Payer: Self-pay | Admitting: Family Medicine

## 2022-12-12 ENCOUNTER — Other Ambulatory Visit: Payer: Self-pay | Admitting: Family Medicine

## 2022-12-12 MED ORDER — OXYCODONE HCL 5 MG PO TABS: ORAL_TABLET | ORAL | 0 refills | Status: DC

## 2022-12-12 NOTE — Telephone Encounter (Signed)
Oxycodone refill requested and sent to [facility] pharmacy. Next OV 11/7

## 2022-12-12 NOTE — Telephone Encounter (Signed)
Okay, oxycodone prescription sent

## 2022-12-17 ENCOUNTER — Other Ambulatory Visit: Payer: Self-pay | Admitting: Family Medicine

## 2022-12-19 ENCOUNTER — Ambulatory Visit (INDEPENDENT_AMBULATORY_CARE_PROVIDER_SITE_OTHER): Payer: 59 | Admitting: Family Medicine

## 2022-12-19 ENCOUNTER — Encounter: Payer: Self-pay | Admitting: Family Medicine

## 2022-12-19 VITALS — BP 132/84 | HR 76 | Wt 154.0 lb

## 2022-12-19 DIAGNOSIS — M19031 Primary osteoarthritis, right wrist: Secondary | ICD-10-CM

## 2022-12-19 DIAGNOSIS — M19041 Primary osteoarthritis, right hand: Secondary | ICD-10-CM

## 2022-12-19 DIAGNOSIS — E139 Other specified diabetes mellitus without complications: Secondary | ICD-10-CM | POA: Diagnosis not present

## 2022-12-19 DIAGNOSIS — M19042 Primary osteoarthritis, left hand: Secondary | ICD-10-CM

## 2022-12-19 DIAGNOSIS — E871 Hypo-osmolality and hyponatremia: Secondary | ICD-10-CM | POA: Diagnosis not present

## 2022-12-19 DIAGNOSIS — Z79899 Other long term (current) drug therapy: Secondary | ICD-10-CM | POA: Diagnosis not present

## 2022-12-19 DIAGNOSIS — M19032 Primary osteoarthritis, left wrist: Secondary | ICD-10-CM

## 2022-12-19 LAB — POCT GLYCOSYLATED HEMOGLOBIN (HGB A1C)
HbA1c POC (<> result, manual entry): 7.1 % (ref 4.0–5.6)
HbA1c, POC (controlled diabetic range): 7.1 % — AB (ref 0.0–7.0)
HbA1c, POC (prediabetic range): 7.1 % — AB (ref 5.7–6.4)
Hemoglobin A1C: 7.1 % — AB (ref 4.0–5.6)

## 2022-12-19 MED ORDER — ATORVASTATIN CALCIUM 20 MG PO TABS: 20.0000 mg | ORAL_TABLET | Freq: Every day | ORAL | 3 refills | Status: DC

## 2022-12-19 MED ORDER — BUPROPION HCL ER (XL) 150 MG PO TB24: 150.0000 mg | ORAL_TABLET | Freq: Every day | ORAL | 3 refills | Status: DC

## 2022-12-19 MED ORDER — LEVOTHYROXINE SODIUM 137 MCG PO TABS: ORAL_TABLET | ORAL | 1 refills | Status: DC

## 2022-12-19 MED ORDER — LOSARTAN POTASSIUM 100 MG PO TABS: 100.0000 mg | ORAL_TABLET | Freq: Every day | ORAL | 1 refills | Status: DC

## 2022-12-19 NOTE — Progress Notes (Signed)
OFFICE VISIT  12/19/2022  CC:  Chief Complaint  Patient presents with   Medical Management of Chronic Issues    Pt is fasting. Pt mentions brier that is stuck in his leg; Was cutting them down. Pt states it has been causing some pain and irritation.     Patient is a 57 y.o. male who presents for 73-month follow-up LADA, hypothyroidism, and chronic pain syndrome. A/P as of last visit: "#1 right thumb paronychia. Slow to completely resolved.  No definite loculation is palpable or visible. Will do Keflex 500 mg 3 times daily x 7 days.  Continue Epson salt soaking daily.   #2 LADA, control is pretty good. Check Hba1c today.   #3 hypothyroidism. TSH was low last check about 3 months ago. I had him hold his levothyroxine for 3 days and then restarted at 1-1/2 of the 137 mcg tabs on 2 days a week and 1 tab all other days. Monitor TSH today.   #4 chronic pain syndrome. Bilateral hand osteoarthritis. Stable. Controlled substance contract updated. Will do urine drug screen next visit.   #5 intermittent right facial and neck paresthesias.  Unknown etiology but could be related to peripheral nerve ischemia from diabetes. Dr. Epimenio Foot reviewed patient's MRI and believes the changes are far more consistent with fairly mild chronic microvascular ischemic change due to his diabetes and hypertension rather then demyelination. He doesn't feel he'll be able to add anything to his care"  INTERIM HX: Doing well. Average glucose at home around 150. Basaglar 20 units daily and NovoLog anywhere from 5 to 10 units before every meal.  No hypoglycemia.  Chronic bilateral hands and wrist pain and chronic neck pain stable. PMP AWARE reviewed today: most recent rx for oxycodone 5 mg was filled 12/12/2022, # 90, rx by me. No red flags.  ROS as above, plus--> no fevers, no CP, no SOB, no wheezing, no cough, no dizziness, no HAs, no rashes, no melena/hematochezia.  No polyuria or polydipsia.  No myalgias.  No  focal weakness, paresthesias, or tremors.  No acute vision or hearing abnormalities.  No dysuria or unusual/new urinary urgency or frequency.  No recent changes in lower legs. No n/v/d or abd pain.  No palpitations.    Past Medical History:  Diagnosis Date   Back pain    s/p back surgery   Carpal tunnel syndrome on both sides    Cervical spinal stenosis    multilevel (CT 08/2022)   History of low back pain 06/24/2011   Hyperlipidemia    Hypothyroidism    LADA (latent autoimmune diabetes in adults), managed as type 1 (HCC) Dx'd 2014   Dr. Morrison Old.   Migraines    Neuropathic pain of right lower extremity    Osteoarthritis of hands, bilateral    Osteoarthritis of wrists, bilateral    Paresthesia    2024.  Right side of face and right side of neck-->intermittent, imaging ok.   PLANTAR WART, RIGHT 10/09/2009   Snoring    Sleep study neg  OSA 04/2018   Tobacco dependence    quit 2012; restarted not long after    Past Surgical History:  Procedure Laterality Date   BACK SURGERY  02/12/2007   lower lumbar fusion- rubber disc in back   CARDIOVASCULAR STRESS TEST  03/21/2014   No ischemia/low risk study; however, LV function showed EF 49% and global LV hypokinesis--echo recommended and it showed normal LV fxn.   CARPAL TUNNEL RELEASE     bilat 2023/24  NASAL SINUS SURGERY  02/11/2005   Polysomnogram  04/2018   NO OSA (Dr. Frances Furbish)   TRANSTHORACIC ECHOCARDIOGRAM  03/24/2014   Normal LV fxn, EF 50-55%, no wall motion abnormalities: (grade 1 diast dysfxn)   WRIST SURGERY     2023/24 L wrist [facility] arthroplasty    Outpatient Medications Prior to Visit  Medication Sig Dispense Refill   amitriptyline (ELAVIL) 25 MG tablet TAKE ONE TABLET BY MOUTH EVERY MORNING AND TAKE THREE TABLETS AT BEDTIME for leg pain 360 tablet 1   citalopram (CELEXA) 40 MG tablet Take 1 tablet (40 mg total) by mouth daily. 30 tablet 0   Continuous Glucose Sensor (FREESTYLE LIBRE 2 SENSOR) MISC APPLY ROUTE EVERY 14  DAYS. 2 each 5   fluticasone (FLONASE) 50 MCG/ACT nasal spray Place 2 sprays into both nostrils daily. 16 g 3   insulin aspart (NOVOLOG FLEXPEN) 100 UNIT/ML FlexPen INJECT 15 UNITS NINTO THE SKIN BEFORE EVERY A MEAL 15 mL 1   Insulin Glargine w/ Trans Port (BASAGLAR TEMPO PEN) 100 UNIT/ML SOPN 22 U SQ qd 15 mL 3   Insulin Pen Needle (SURE COMFORT PEN NEEDLES) 32G X 4 MM MISC USE FOUR TIMES DAILY. 200 each 5   meloxicam (MOBIC) 15 MG tablet TAKE ONE TABLET BY MOUTH DAILY 30 tablet 2   metoprolol succinate (TOPROL-XL) 25 MG 24 hr tablet TAKE ONE TABLET BY MOUTH EVERY DAY 30 tablet 2   oxyCODONE (OXY IR/ROXICODONE) 5 MG immediate release tablet 1-2 tabs po qid prn 90 tablet 0   pantoprazole (PROTONIX) 40 MG tablet Take 1 tablet (40 mg total) by mouth daily. 90 tablet 1   sildenafil (VIAGRA) 100 MG tablet Take 1 tablet (100 mg total) by mouth daily as needed for erectile dysfunction. 10 tablet 11   tamsulosin (FLOMAX) 0.4 MG CAPS capsule Take 1 capsule (0.4 mg total) by mouth daily after supper. 90 capsule 1   atorvastatin (LIPITOR) 20 MG tablet Take 1 tablet (20 mg total) by mouth daily. 90 tablet 3   buPROPion (WELLBUTRIN XL) 150 MG 24 hr tablet Take 1 tablet (150 mg total) by mouth daily. 90 tablet 3   cephALEXin (KEFLEX) 500 MG capsule Take 1 capsule (500 mg total) by mouth 3 (three) times daily. 21 capsule 0   levothyroxine (SYNTHROID) 137 MCG tablet Take 1-1/2 tabs on Mondays Wednesdays and Fridays and 1 tab all other days. 96 tablet 0   losartan (COZAAR) 100 MG tablet Take 1 tablet (100 mg total) by mouth daily. 90 tablet 1   No facility-administered medications prior to visit.    Allergies  Allergen Reactions   Amlodipine Other (See Comments)    Headache   Augmentin [Amoxicillin-Pot Clavulanate] Nausea And Vomiting   Oxycodone-Acetaminophen     REACTION: itching   Levothyroxine Rash    Had rash associated with generic levothyroxine, but not brand-name Synthroid.  Likely allergic to a  dye or filler in the generic preparation.   Lisinopril Cough    Review of Systems As per HPI  PE:    12/19/2022    8:48 AM 12/19/2022    8:41 AM 09/18/2022   10:30 AM  Vitals with BMI  Weight  154 lbs   Systolic 132 147 161  Diastolic 84 96 84  Pulse  76      Physical Exam  Gen: Alert, well appearing.  Patient is oriented to person, place, time, and situation. AFFECT: pleasant, lucid thought and speech. No further exam today  LABS:  Last  CBC Lab Results  Component Value Date   WBC 8.6 06/17/2022   HGB 15.1 06/17/2022   HCT 44.5 06/17/2022   MCV 90.8 06/17/2022   MCH 31.5 12/01/2020   RDW 13.7 06/17/2022   PLT 293.0 06/17/2022   Last metabolic panel Lab Results  Component Value Date   GLUCOSE 166 (H) 09/18/2022   NA 128 (L) 09/18/2022   K 4.7 09/18/2022   CL 92 (L) 09/18/2022   CO2 30 09/18/2022   BUN 13 09/18/2022   CREATININE 1.07 09/18/2022   GFR 76.94 09/18/2022   CALCIUM 8.9 09/18/2022   PROT 6.7 06/17/2022   ALBUMIN 4.1 06/17/2022   BILITOT 0.8 06/17/2022   ALKPHOS 145 (H) 06/17/2022   AST 15 06/17/2022   ALT 16 06/17/2022   ANIONGAP 10 09/15/2018   Last lipids Lab Results  Component Value Date   CHOL 105 06/17/2022   HDL 57.00 06/17/2022   LDLCALC 38 06/17/2022   TRIG 48.0 06/17/2022   CHOLHDL 2 06/17/2022   Last hemoglobin A1c Lab Results  Component Value Date   HGBA1C 7.1 (A) 12/19/2022   HGBA1C 7.1 12/19/2022   HGBA1C 7.1 (A) 12/19/2022   HGBA1C 7.1 (A) 12/19/2022   Last thyroid functions Lab Results  Component Value Date   TSH 3.03 09/18/2022   T3TOTAL 91 01/12/2021   IMPRESSION AND PLAN:  #1 LADA, control is pretty good. POC Hba1c 7.1% today.  When we have tried for tighter control in the past we have caused too much hypoglycemia. Continue Basaglar 20 units/day and NovoLog 5 to 10 units before every meal.  #2 acquired hypothyroidism. TSH 3.03 about 3 months ago. Continue 137 mcg levothyroxine tab, 1-1/2 tabs on Mondays  Wednesdays and Fridays and 1 tab all other days. Plan repeat TSH in 3 months.  #3 chronic pain syndrome. 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 Stable. Continue oxycodone 5 mg, 1-2 twice daily as needed, number 90/month.  A new prescription was not needed today. Will do urine drug screen at next follow-up visit in 3 months.  An After Visit Summary was printed and given to the patient.  FOLLOW UP: Return in about 3 months (around 03/21/2023) for routine chronic illness f/u. Next CPE 06/2023 Signed:  Santiago Bumpers, MD           12/19/2022

## 2022-12-31 ENCOUNTER — Other Ambulatory Visit: Payer: Self-pay | Admitting: Family Medicine

## 2023-01-02 ENCOUNTER — Encounter: Payer: Self-pay | Admitting: Family Medicine

## 2023-01-03 MED ORDER — SILDENAFIL CITRATE 100 MG PO TABS: 100.0000 mg | ORAL_TABLET | Freq: Every day | ORAL | 11 refills | Status: AC | PRN

## 2023-01-03 NOTE — Telephone Encounter (Signed)
Molli Knock, Viagra prescription sent to [facility]

## 2023-01-11 ENCOUNTER — Other Ambulatory Visit: Payer: Self-pay | Admitting: Family Medicine

## 2023-01-12 ENCOUNTER — Other Ambulatory Visit: Payer: Self-pay | Admitting: Family Medicine

## 2023-01-13 ENCOUNTER — Other Ambulatory Visit: Payer: Self-pay | Admitting: Family Medicine

## 2023-01-13 MED ORDER — CITALOPRAM HYDROBROMIDE 40 MG PO TABS: 40.0000 mg | ORAL_TABLET | Freq: Every day | ORAL | 0 refills | Status: DC

## 2023-01-14 MED ORDER — OXYCODONE HCL 5 MG PO TABS: ORAL_TABLET | ORAL | 0 refills | Status: DC

## 2023-01-14 NOTE — Telephone Encounter (Signed)
Refill requested for OxyCodone sent to [facility] pharmacy. Next OV 03/21/23

## 2023-01-20 ENCOUNTER — Encounter: Payer: Self-pay | Admitting: Family Medicine

## 2023-01-20 ENCOUNTER — Other Ambulatory Visit: Payer: Self-pay | Admitting: Family Medicine

## 2023-01-20 MED ORDER — AMITRIPTYLINE HCL 25 MG PO TABS: ORAL_TABLET | ORAL | 1 refills | Status: DC

## 2023-01-21 ENCOUNTER — Encounter: Payer: Self-pay | Admitting: Urgent Care

## 2023-01-21 ENCOUNTER — Ambulatory Visit (INDEPENDENT_AMBULATORY_CARE_PROVIDER_SITE_OTHER): Payer: 59 | Admitting: Urgent Care

## 2023-01-21 VITALS — BP 167/94 | HR 76 | Temp 98.3°F | Wt 151.0 lb

## 2023-01-21 DIAGNOSIS — J069 Acute upper respiratory infection, unspecified: Secondary | ICD-10-CM | POA: Diagnosis not present

## 2023-01-21 DIAGNOSIS — R0981 Nasal congestion: Secondary | ICD-10-CM

## 2023-01-21 MED ORDER — IPRATROPIUM BROMIDE 0.03 % NA SOLN: 2.0000 | Freq: Two times a day (BID) | NASAL | 12 refills | Status: AC

## 2023-01-21 MED ORDER — GUAIFENESIN ER 600 MG PO TB12: 600.0000 mg | ORAL_TABLET | Freq: Two times a day (BID) | ORAL | 0 refills | Status: DC | PRN

## 2023-01-21 MED ORDER — MONTELUKAST SODIUM 10 MG PO TABS: 10.0000 mg | ORAL_TABLET | Freq: Every day | ORAL | 0 refills | Status: DC

## 2023-01-21 NOTE — Progress Notes (Signed)
Established Patient Office Visit  Subjective:  Patient ID: Daniel Stevenson, male    DOB: [birth year]  Age: 57 y.o. MRN: [id number]  Chief Complaint  Patient presents with   Headache    Headache and scratchy throat for a few days. He also has nasal congestion. No covid or flu testing done.    57yo male, a known type 1 diabetic, presents with a three-day history of constant, throbbing frontal headache and a scratchy throat. He reports no pain in the cheeks or behind the eyes, but the headache intensifies upon closing the eyes. He has been self-medicating with Mucinex DM and Mucinex nasal spray, which provide relief, albeit temporary. The patient denies any ear pain or pressure, significant nasal congestion, or sore throat. He reports no fever or new lower respiratory symptoms, but mentions a chronic cough. He denies body aches or myalgias. No rash or GI sx.   The patient has been in close contact with his grandchildren, aged six to 75, the day before the consultation. He also reports body aches, which have been more bothersome in the past three days. The patient's blood sugar levels are generally well-controlled, with a current reading of 187 in the office. He is on Hospital doctor and Novolog insulin injections, taking 22 units of the long-acting and 5-9 units of the fast-acting insulin.  The patient has a history of sinus problems and has been experiencing some drainage, possibly related to his sinus issues. He has not been diagnosed with COPD or emphysema.    Headache     Patient Active Problem List   Diagnosis Date Noted   Carpal tunnel syndrome of left wrist 08/13/2021   Other symptoms and signs involving cognitive functions and awareness 06/30/2019   Lymphadenitis, acute 05/02/2015   Headache 01/10/2015   Fatigue 01/10/2015   Allergic rhinitis 01/10/2015   Medication intolerance 09/06/2013   Health maintenance examination 07/13/2013   Type II or unspecified type diabetes  mellitus without mention of complication, uncontrolled 01/18/2013   GERD (gastroesophageal reflux disease) 01/18/2013   Right leg pain 08/18/2012   Ganglion cyst of flexor tendon sheath 08/05/2011   History of low back pain 06/24/2011   Tobacco dependence 11/20/2010   Hypothyroidism 10/09/2009   Past Medical History:  Diagnosis Date   Back pain    s/p back surgery   Carpal tunnel syndrome on both sides    Cervical spinal stenosis    multilevel (CT 08/2022)   History of low back pain 06/24/2011   Hyperlipidemia    Hypothyroidism    LADA (latent autoimmune diabetes in adults), managed as type 1 (HCC) Dx'd 2014   Dr. Morrison Old.   Migraines    Neuropathic pain of right lower extremity    Osteoarthritis of hands, bilateral    Osteoarthritis of wrists, bilateral    Paresthesia    2024.  Right side of face and right side of neck-->intermittent, imaging ok.   PLANTAR WART, RIGHT 10/09/2009   Snoring    Sleep study neg  OSA 04/2018   Tobacco dependence    quit 2012; restarted not long after   Social History   Tobacco Use   Smoking status: Every Day    Current packs/day: 0.00    Average packs/day: 1 pack/day for 35.0 years (35.0 ttl pk-yrs)    Types: Cigarettes    Start date: 12/19/1975    Last attempt to quit: 12/19/2010    Years since quitting: 12.0   Smokeless tobacco: Never  Vaping Use  Vaping status: Never Used  Substance Use Topics   Alcohol use: Yes    Comment: rarely   Drug use: No      ROS: as noted in HPI  Objective:     BP (!) 167/94   Pulse 76   Temp 98.3 F (36.8 C) (Oral)   Wt 151 lb (68.5 kg)   SpO2 97%   BMI 25.13 kg/m  BP Readings from Last 3 Encounters:  01/21/23 (!) 167/94  12/19/22 132/84  09/18/22 138/84   Wt Readings from Last 3 Encounters:  01/21/23 151 lb (68.5 kg)  12/19/22 154 lb (69.9 kg)  09/18/22 155 lb (70.3 kg)      Physical Exam Vitals and nursing note reviewed.  Constitutional:      General: He is not in acute  distress.    Appearance: Normal appearance. He is well-developed. He is not ill-appearing, toxic-appearing or diaphoretic.  HENT:     Head: Normocephalic and atraumatic.     Right Ear: Tympanic membrane, ear canal and external ear normal. There is no impacted cerumen.     Left Ear: Tympanic membrane, ear canal and external ear normal. There is no impacted cerumen.     Nose: Rhinorrhea (posterior) present. No congestion.     Mouth/Throat:     Mouth: Mucous membranes are moist.     Pharynx: Oropharynx is clear. No oropharyngeal exudate or posterior oropharyngeal erythema.  Eyes:     General: No scleral icterus.       Right eye: No discharge.        Left eye: No discharge.     Extraocular Movements: Extraocular movements intact.     Right eye: Normal extraocular motion.     Left eye: Normal extraocular motion.     Pupils: Pupils are equal, round, and reactive to light.  Neck:     Thyroid: No thyroid mass, thyromegaly or thyroid tenderness.  Cardiovascular:     Rate and Rhythm: Normal rate and regular rhythm.     Pulses: Normal pulses.  Pulmonary:     Effort: Pulmonary effort is normal. No respiratory distress.     Breath sounds: Normal breath sounds. No stridor. No wheezing, rhonchi or rales.  Chest:     Chest wall: No tenderness.  Musculoskeletal:     Cervical back: Normal range of motion and neck supple. No rigidity or tenderness.     Right lower leg: No edema.     Left lower leg: No edema.  Lymphadenopathy:     Cervical: No cervical adenopathy.  Skin:    General: Skin is warm and dry.     Coloration: Skin is not jaundiced.     Findings: No bruising, erythema or rash.  Neurological:     General: No focal deficit present.     Mental Status: He is alert and oriented to person, place, and time.  Psychiatric:        Mood and Affect: Mood normal.        Behavior: Behavior normal.      No results found for any visits on 01/21/23.  Last CBC Lab Results  Component Value  Date   WBC 8.6 06/17/2022   HGB 15.1 06/17/2022   HCT 44.5 06/17/2022   MCV 90.8 06/17/2022   MCH 31.5 12/01/2020   RDW 13.7 06/17/2022   PLT 293.0 06/17/2022   Last metabolic panel Lab Results  Component Value Date   GLUCOSE 166 (H) 09/18/2022   NA 128 (L) 09/18/2022  K 4.7 09/18/2022   CL 92 (L) 09/18/2022   CO2 30 09/18/2022   BUN 13 09/18/2022   CREATININE 1.07 09/18/2022   GFR 76.94 09/18/2022   CALCIUM 8.9 09/18/2022   PROT 6.7 06/17/2022   ALBUMIN 4.1 06/17/2022   BILITOT 0.8 06/17/2022   ALKPHOS 145 (H) 06/17/2022   AST 15 06/17/2022   ALT 16 06/17/2022   ANIONGAP 10 09/15/2018      The ASCVD Risk score (Arnett DK, et al., 2019) failed to calculate for the following reasons:   The valid total cholesterol range is 130 to 320 mg/dL  Assessment & Plan:  Viral upper respiratory tract infection with cough -     Ipratropium Bromide; Place 2 sprays into both nostrils every 12 (twelve) hours.  Dispense: 30 mL; Refill: 12 -     Montelukast Sodium; Take 1 tablet (10 mg total) by mouth at bedtime.  Dispense: 14 tablet; Refill: 0 -     guaiFENesin ER; Take 1 tablet (600 mg total) by mouth 2 (two) times daily as needed for cough or to loosen phlegm.  Dispense: 20 tablet; Refill: 0  Sinus congestion -     Ipratropium Bromide; Place 2 sprays into both nostrils every 12 (twelve) hours.  Dispense: 30 mL; Refill: 12 -     Montelukast Sodium; Take 1 tablet (10 mg total) by mouth at bedtime.  Dispense: 14 tablet; Refill: 0 -     guaiFENesin ER; Take 1 tablet (600 mg total) by mouth 2 (two) times daily as needed for cough or to loosen phlegm.  Dispense: 20 tablet; Refill: 0   Assessment & Plan Sinus Headache   Constant, throbbing frontal headache for 3 days. No significant nasal congestion. Some postnasal drainage noted on exam. Patient has been using Mucinex DM and Mucinex nasal spray with minimal relief.   -Discontinue over-the-counter Mucinex products.   -Prescribe plain  Mucinex (guaifenesin) for mucus thinning.   -Prescribe nasal spray to open up sinus area.   -Prescribe short course of montelukast to help with respiratory system.    Scratchy Throat   Mild throat discomfort, no fever, no significant erythema on exam.   -No antibiotics needed at this time.   -Advise hydration to alleviate discomfort.    Type 1 Diabetes   Blood sugar 187 at time of visit. Patient on Basaglar and Novolog insulin.   -Continue current insulin regimen.   -Avoid prednisone due to potential for increasing blood sugars.    No follow-ups on file.   Maretta Bees, PA

## 2023-01-21 NOTE — Patient Instructions (Addendum)
Please stop taking mucinex-DM. This is likely the cause of your elevated blood pressure noted in office today. (Great readings in November)  Will switch to plain mucinex which should not affect blood pressure. Drink plenty of water while taking this medication.  Will also start Atrovent nasal spray. This will help with post nasal drainage. Use 2 sprays in each nostril every 12 hours as needed. Take one tablet of montelukast at night. Take before bed as it may make you sleepy.  Monitor for any new or worsening symptoms such as fever, shortness of breath or wheezing which may warrant a recheck.   Keep your appointment currently scheduled for Feb. With Dr. Milinda Cave.

## 2023-02-09 ENCOUNTER — Other Ambulatory Visit: Payer: Self-pay | Admitting: Family Medicine

## 2023-02-10 ENCOUNTER — Other Ambulatory Visit: Payer: Self-pay | Admitting: Family Medicine

## 2023-02-10 NOTE — Telephone Encounter (Signed)
Refills requested for Celexa and Oxycodone. Next OV 2/7

## 2023-02-11 MED ORDER — OXYCODONE HCL 5 MG PO TABS: ORAL_TABLET | ORAL | 0 refills | Status: DC

## 2023-02-11 MED ORDER — CITALOPRAM HYDROBROMIDE 40 MG PO TABS: 40.0000 mg | ORAL_TABLET | Freq: Every day | ORAL | 1 refills | Status: DC

## 2023-02-18 ENCOUNTER — Other Ambulatory Visit: Payer: Self-pay | Admitting: Family Medicine

## 2023-02-18 MED ORDER — BASAGLAR TEMPO PEN 100 UNIT/ML ~~LOC~~ SOPN: PEN_INJECTOR | SUBCUTANEOUS | 3 refills | Status: DC

## 2023-02-18 MED ORDER — NOVOLOG FLEXPEN 100 UNIT/ML ~~LOC~~ SOPN: PEN_INJECTOR | SUBCUTANEOUS | 1 refills | Status: DC

## 2023-02-18 NOTE — Telephone Encounter (Signed)
 Copied from CRM [id number]. Topic: Clinical - Medication Refill >> Feb 18, 2023  2:08 PM Drema MATSU wrote: Most Recent Primary Care Visit:  Provider: CRAIN, WHITNEY L  Department: [facility]  Visit Type: OFFICE VISIT  Date: 01/21/2023  Medication: insulin  aspart (NOVOLOG  FLEXPEN) 100 UNIT/ML FlexPen   Has the patient contacted their pharmacy?  (Agent: If no, request that the patient contact the pharmacy for the refill. If patient does not wish to contact the pharmacy document the reason why and proceed with request.) (Agent: If yes, when and what did the pharmacy advise?)  Is this the correct pharmacy for this prescription?  If no, delete pharmacy and type the correct one.  This is the patient's preferred pharmacy:  [id number] - [place], [place] - [address] [place] [address] Phone: [phone] Fax: [phone]  [facility] - [place], [place] - [address] [place] [address] Phone: [phone] Fax: [phone]   Has the prescription been filled recently?   Is the patient out of the medication?   Has the patient been seen for an appointment in the last year OR does the patient have an upcoming appointment?   Can we respond through MyChart?   Agent: Please be advised that Rx refills may take up to 3 business days. We ask that you follow-up with your pharmacy.

## 2023-02-21 ENCOUNTER — Ambulatory Visit (INDEPENDENT_AMBULATORY_CARE_PROVIDER_SITE_OTHER): Payer: 59 | Admitting: Urgent Care

## 2023-02-21 ENCOUNTER — Encounter: Payer: Self-pay | Admitting: Urgent Care

## 2023-02-21 VITALS — BP 154/84 | HR 76 | Temp 98.3°F | Wt 157.8 lb

## 2023-02-21 DIAGNOSIS — J0101 Acute recurrent maxillary sinusitis: Secondary | ICD-10-CM

## 2023-02-21 DIAGNOSIS — H66001 Acute suppurative otitis media without spontaneous rupture of ear drum, right ear: Secondary | ICD-10-CM | POA: Diagnosis not present

## 2023-02-21 DIAGNOSIS — R0981 Nasal congestion: Secondary | ICD-10-CM | POA: Diagnosis not present

## 2023-02-21 MED ORDER — DOXYCYCLINE HYCLATE 100 MG PO CAPS: 100.0000 mg | ORAL_CAPSULE | Freq: Two times a day (BID) | ORAL | 0 refills | Status: AC

## 2023-02-21 MED ORDER — FLUTICASONE PROPIONATE 50 MCG/ACT NA SUSP: 2.0000 | Freq: Every day | NASAL | 3 refills | Status: AC

## 2023-02-21 NOTE — Patient Instructions (Signed)
 Continue use of your sinus lavage machine. Bring us  a sample (at least 10mL) of the phlegm for a culture  Use flonase  nasal spray 1-2 sprays daily in each nostril. Start doxycycline  twice daily. Take for one week. Do not take the medication within two hours of consuming dairy products.  If your symptoms persist or recur, consider allergy evaluation or a repeat sinus CT Scan.

## 2023-02-21 NOTE — Progress Notes (Signed)
 Established Patient Office Visit  Subjective:  Patient ID: Daniel Stevenson, male    DOB: [birth year]  Age: 58 y.o. MRN: [id number]  Chief Complaint  Patient presents with   Ear Pain    Onset wed   Discussed the use of AI software for clinical note transcription with the patient who gave verbal consent to proceed.   History of Present Illness The patient, with a history of chronic sinusitis, presents with right-sided ear pain that began approximately two days ago. The discomfort, described as a sharp pain, was preceded by a decrease in hearing. The patient attempted to alleviate the symptoms by flushing the ear, which resulted in complete hearing loss and intensified pain. He also reports ongoing nasal congestion and frequent sinus infections, which he manages with a lavage machine for saline irrigation every other day.  The patient has a history of sinus surgery due to severe migraines and has been using a prescribed nasal spray, which has recently ran out. He also reports a significant amount of nasal discharge, which he has been able to expel using the lavage machine. The patient is a smoker but denies any coughing beyond what he considers normal for him. He has previously been prescribed Flonase , but the prescription has since expired.    Patient Active Problem List   Diagnosis Date Noted   Carpal tunnel syndrome of left wrist 08/13/2021   Other symptoms and signs involving cognitive functions and awareness 06/30/2019   Lymphadenitis, acute 05/02/2015   Headache 01/10/2015   Fatigue 01/10/2015   Allergic rhinitis 01/10/2015   Medication intolerance 09/06/2013   Health maintenance examination 07/13/2013   Type II or unspecified type diabetes mellitus without mention of complication, uncontrolled 01/18/2013   GERD (gastroesophageal reflux disease) 01/18/2013   Right leg pain 08/18/2012   Ganglion cyst of flexor tendon sheath 08/05/2011   History of low back pain 06/24/2011    Tobacco dependence 11/20/2010   Hypothyroidism 10/09/2009   Past Medical History:  Diagnosis Date   Back pain    s/p back surgery   Carpal tunnel syndrome on both sides    Cervical spinal stenosis    multilevel (CT 08/2022)   History of low back pain 06/24/2011   Hyperlipidemia    Hypothyroidism    LADA (latent autoimmune diabetes in adults), managed as type 1 (HCC) Dx'd 2014   Dr. Hosie.   Migraines    Neuropathic pain of right lower extremity    Osteoarthritis of hands, bilateral    Osteoarthritis of wrists, bilateral    Paresthesia    2024.  Right side of face and right side of neck-->intermittent, imaging ok.   PLANTAR WART, RIGHT 10/09/2009   Snoring    Sleep study neg  OSA 04/2018   Tobacco dependence    quit 2012; restarted not long after   Social History   Tobacco Use   Smoking status: Every Day    Current packs/day: 0.00    Average packs/day: 1 pack/day for 35.0 years (35.0 ttl pk-yrs)    Types: Cigarettes    Start date: 12/19/1975    Last attempt to quit: 12/19/2010    Years since quitting: 12.1   Smokeless tobacco: Never  Vaping Use   Vaping status: Never Used  Substance Use Topics   Alcohol use: Yes    Comment: rarely   Drug use: No      ROS: as noted in HPI  Objective:     BP (!) 154/84   Pulse 76  Temp 98.3 F (36.8 C)   Wt 157 lb 12 oz (71.6 kg)   SpO2 98%   BMI 26.25 kg/m  BP Readings from Last 3 Encounters:  02/21/23 (!) 154/84  01/21/23 (!) 167/94  12/19/22 132/84   Wt Readings from Last 3 Encounters:  02/21/23 157 lb 12 oz (71.6 kg)  01/21/23 151 lb (68.5 kg)  12/19/22 154 lb (69.9 kg)      Physical Exam Vitals and nursing note reviewed.  Constitutional:      General: He is not in acute distress.    Appearance: Normal appearance. He is not ill-appearing, toxic-appearing or diaphoretic.  HENT:     Head: Normocephalic and atraumatic.     Right Ear: External ear normal. Decreased hearing noted. No drainage, swelling or  tenderness. A middle ear effusion is present. There is no impacted cerumen. No foreign body. No mastoid tenderness. Tympanic membrane is injected, erythematous and bulging. Tympanic membrane is not perforated.     Left Ear: External ear normal. No drainage, swelling or tenderness. A middle ear effusion is present. There is no impacted cerumen. No foreign body. No mastoid tenderness. Tympanic membrane is not injected, perforated, erythematous or bulging.     Ears:      Mouth/Throat:     Mouth: Mucous membranes are moist.     Pharynx: Oropharynx is clear. No oropharyngeal exudate or posterior oropharyngeal erythema.  Eyes:     General: No scleral icterus.       Right eye: No discharge.        Left eye: No discharge.     Extraocular Movements: Extraocular movements intact.     Pupils: Pupils are equal, round, and reactive to light.  Cardiovascular:     Rate and Rhythm: Normal rate and regular rhythm.  Pulmonary:     Effort: Pulmonary effort is normal. No respiratory distress.     Breath sounds: No stridor. Rhonchi (minimal, posterior) present. No wheezing or rales.  Musculoskeletal:     Cervical back: Normal range of motion and neck supple. No tenderness.  Lymphadenopathy:     Cervical: No cervical adenopathy.  Neurological:     Mental Status: He is alert.      No results found for any visits on 02/21/23.  Last CBC Lab Results  Component Value Date   WBC 8.6 06/17/2022   HGB 15.1 06/17/2022   HCT 44.5 06/17/2022   MCV 90.8 06/17/2022   MCH 31.5 12/01/2020   RDW 13.7 06/17/2022   PLT 293.0 06/17/2022   Last metabolic panel Lab Results  Component Value Date   GLUCOSE 166 (H) 09/18/2022   NA 128 (L) 09/18/2022   K 4.7 09/18/2022   CL 92 (L) 09/18/2022   CO2 30 09/18/2022   BUN 13 09/18/2022   CREATININE 1.07 09/18/2022   GFR 76.94 09/18/2022   CALCIUM  8.9 09/18/2022   PROT 6.7 06/17/2022   ALBUMIN 4.1 06/17/2022   BILITOT 0.8 06/17/2022   ALKPHOS 145 (H) 06/17/2022    AST 15 06/17/2022   ALT 16 06/17/2022   ANIONGAP 10 09/15/2018      The ASCVD Risk score (Arnett DK, et al., 2019) failed to calculate for the following reasons:   The valid total cholesterol range is 130 to 320 mg/dL  Assessment & Plan:  Acute recurrent maxillary sinusitis -     Doxycycline  Hyclate; Take 1 capsule (100 mg total) by mouth 2 (two) times daily for 7 days.  Dispense: 14 capsule; Refill: 0 -  Fluticasone  Propionate; Place 2 sprays into both nostrils daily.  Dispense: 16 g; Refill: 3  Chronic nasal congestion -     Fluticasone  Propionate; Place 2 sprays into both nostrils daily.  Dispense: 16 g; Refill: 3  Non-recurrent acute suppurative otitis media of right ear without spontaneous rupture of tympanic membrane -     Doxycycline  Hyclate; Take 1 capsule (100 mg total) by mouth 2 (two) times daily for 7 days.  Dispense: 14 capsule; Refill: 0  Assessment and Plan Right Otitis Media Acute onset of right ear pain and hearing loss. Eardrum appears injected, possibly secondary to infection or hearing aid use. -Treat for infection with abx.  Chronic Sinusitis Persistent nasal congestion and history of large nasal discharge. Regular use of lavage with saline solution for symptom management. Previous sinus surgery. -Continue lavage use. -Switch nasal spray to Flonase  for right nostril congestion. -doxycycline  BID x 7 days -Consider future allergy testing and sinus CT if symptoms persist or worsen.  Smoking Chronic smoker, no increased cough reported. -No change in management at this time.  No follow-ups on file.   Benton LITTIE Gave, PA

## 2023-03-13 ENCOUNTER — Other Ambulatory Visit: Payer: Self-pay | Admitting: Family Medicine

## 2023-03-14 MED ORDER — OXYCODONE HCL 5 MG PO TABS: ORAL_TABLET | ORAL | 0 refills | Status: DC

## 2023-03-14 NOTE — Telephone Encounter (Signed)
Refill requested for Oxycodone. Next OV 2/7

## 2023-03-20 NOTE — Patient Instructions (Signed)

## 2023-03-21 ENCOUNTER — Encounter: Payer: Self-pay | Admitting: Family Medicine

## 2023-03-21 ENCOUNTER — Ambulatory Visit (INDEPENDENT_AMBULATORY_CARE_PROVIDER_SITE_OTHER): Payer: 59 | Admitting: Family Medicine

## 2023-03-21 VITALS — BP 143/88 | HR 85 | Ht 65.0 in | Wt 155.6 lb

## 2023-03-21 DIAGNOSIS — M19041 Primary osteoarthritis, right hand: Secondary | ICD-10-CM

## 2023-03-21 DIAGNOSIS — J329 Chronic sinusitis, unspecified: Secondary | ICD-10-CM | POA: Diagnosis not present

## 2023-03-21 DIAGNOSIS — M19032 Primary osteoarthritis, left wrist: Secondary | ICD-10-CM

## 2023-03-21 DIAGNOSIS — I10 Essential (primary) hypertension: Secondary | ICD-10-CM

## 2023-03-21 DIAGNOSIS — E039 Hypothyroidism, unspecified: Secondary | ICD-10-CM | POA: Diagnosis not present

## 2023-03-21 DIAGNOSIS — G894 Chronic pain syndrome: Secondary | ICD-10-CM

## 2023-03-21 DIAGNOSIS — M542 Cervicalgia: Secondary | ICD-10-CM

## 2023-03-21 DIAGNOSIS — M19042 Primary osteoarthritis, left hand: Secondary | ICD-10-CM

## 2023-03-21 DIAGNOSIS — Z79899 Other long term (current) drug therapy: Secondary | ICD-10-CM

## 2023-03-21 DIAGNOSIS — E139 Other specified diabetes mellitus without complications: Secondary | ICD-10-CM

## 2023-03-21 DIAGNOSIS — M19031 Primary osteoarthritis, right wrist: Secondary | ICD-10-CM

## 2023-03-21 DIAGNOSIS — G8929 Other chronic pain: Secondary | ICD-10-CM

## 2023-03-21 LAB — POCT GLYCOSYLATED HEMOGLOBIN (HGB A1C)
HbA1c POC (<> result, manual entry): 7.3 % (ref 4.0–5.6)
HbA1c, POC (controlled diabetic range): 7.3 % — AB (ref 0.0–7.0)
HbA1c, POC (prediabetic range): 7.3 % — AB (ref 5.7–6.4)
Hemoglobin A1C: 7.3 % — AB (ref 4.0–5.6)

## 2023-03-21 LAB — BASIC METABOLIC PANEL
BUN: 10 mg/dL (ref 6–23)
CO2: 34 meq/L — ABNORMAL HIGH (ref 19–32)
Calcium: 9.1 mg/dL (ref 8.4–10.5)
Chloride: 89 meq/L — ABNORMAL LOW (ref 96–112)
Creatinine, Ser: 1.13 mg/dL (ref 0.40–1.50)
GFR: 71.81 mL/min (ref >60.00)
Glucose, Bld: 159 mg/dL — ABNORMAL HIGH (ref 70–99)
Potassium: 4.8 meq/L (ref 3.5–5.1)
Sodium: 131 meq/L — ABNORMAL LOW (ref 135–145)

## 2023-03-21 LAB — TSH: TSH: 10.99 u[IU]/mL — ABNORMAL HIGH (ref 0.35–5.50)

## 2023-03-21 MED ORDER — LEVOFLOXACIN 750 MG PO TABS: 750.0000 mg | ORAL_TABLET | Freq: Every day | ORAL | 0 refills | Status: DC

## 2023-03-21 MED ORDER — METOPROLOL SUCCINATE ER 50 MG PO TB24: 50.0000 mg | ORAL_TABLET | Freq: Every day | ORAL | 0 refills | Status: DC

## 2023-03-21 NOTE — Progress Notes (Signed)
 OFFICE VISIT  03/21/2023  CC:  Chief Complaint  Patient presents with   Medical Management of Chronic Issues    Patient is a 58 y.o. male who presents for 85-month follow-up LADA, hypothyroidism, and chronic pain syndrome. A/P as of last visit: #1 LADA, control is pretty good. POC Hba1c 7.1% today.  When we have tried for tighter control in the past we have caused too much hypoglycemia. Continue Basaglar  20 units/day and NovoLog  5 to 10 units before every meal.   #2 acquired hypothyroidism. TSH 3.03 about 3 months ago. Continue 137 mcg levothyroxine  tab, 1-1/2 tabs on Mondays Wednesdays and Fridays and 1 tab all other days. Plan repeat TSH in 3 months.   #3 chronic pain syndrome. 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 Stable. Continue oxycodone  5 mg, 1-2 twice daily as needed, number 90/month.  A new prescription was not needed today. Will do urine drug screen at next follow-up visit in 3 months.  INTERIM HX: Has had chronic sign cystitis symptoms for the last 2 months or so. Purulent mucus from the left side of his nose.  It gives him some headaches.  No fever.  Minimal problem with cough or sore throat.  He is using Flonase , Atrovent , and saline rinses.  He took a course of doxycycline  about a month ago without improvement.   Chronic pain in hands, wrists, and neck is well-controlled.  PMP AWARE reviewed today: most recent rx for oxycodone  was filled 03/14/23, # 90, rx by me. No red flags.  Past Medical History:  Diagnosis Date   Back pain    s/p back surgery   Carpal tunnel syndrome on both sides    Cervical spinal stenosis    multilevel (CT 08/2022)   History of low back pain 06/24/2011   Hyperlipidemia    Hypothyroidism    LADA (latent autoimmune diabetes in adults), managed as type 1 (HCC) Dx'd 2014   Dr. Hosie.   Migraines    Neuropathic pain of right lower extremity    Osteoarthritis of hands, bilateral     Osteoarthritis of wrists, bilateral    Paresthesia    2024.  Right side of face and right side of neck-->intermittent, imaging ok.   PLANTAR WART, RIGHT 10/09/2009   Snoring    Sleep study neg  OSA 04/2018   Tobacco dependence    quit 2012; restarted not long after    Past Surgical History:  Procedure Laterality Date   BACK SURGERY  02/12/2007   lower lumbar fusion- rubber disc in back   CARDIOVASCULAR STRESS TEST  03/21/2014   No ischemia/low risk study; however, LV function showed EF 49% and global LV hypokinesis--echo recommended and it showed normal LV fxn.   CARPAL TUNNEL RELEASE     bilat 2023/24   NASAL SINUS SURGERY  02/11/2005   Polysomnogram  04/2018   NO OSA (Dr. Buck)   TRANSTHORACIC ECHOCARDIOGRAM  03/24/2014   Normal LV fxn, EF 50-55%, no wall motion abnormalities: (grade 1 diast dysfxn)   WRIST SURGERY     2023/24 L wrist [facility] arthroplasty    Outpatient Medications Prior to Visit  Medication Sig Dispense Refill   amitriptyline  (ELAVIL ) 25 MG tablet TAKE ONE TABLET BY MOUTH EVERY MORNING AND TAKE THREE TABLETS AT BEDTIME for leg pain 360 tablet 1   atorvastatin  (LIPITOR) 20 MG tablet Take 1 tablet (20 mg total) by mouth daily. 90 tablet 3   buPROPion  (WELLBUTRIN  XL) 150 MG 24 hr  tablet Take 1 tablet (150 mg total) by mouth daily. 90 tablet 3   citalopram  (CELEXA ) 40 MG tablet Take 1 tablet (40 mg total) by mouth daily. 90 tablet 1   citalopram  (CELEXA ) 40 MG tablet Take 1 tablet (40 mg total) by mouth daily. 90 tablet 1   Continuous Glucose Sensor (FREESTYLE LIBRE 2 SENSOR) MISC APPLY ROUTE EVERY 14 DAYS. 2 each 5   fluticasone  (FLONASE ) 50 MCG/ACT nasal spray Place 2 sprays into both nostrils daily. 16 g 3   guaiFENesin  (MUCINEX ) 600 MG 12 hr tablet Take 1 tablet (600 mg total) by mouth 2 (two) times daily as needed for cough or to loosen phlegm. 20 tablet 0   insulin  aspart (NOVOLOG  FLEXPEN) 100 UNIT/ML FlexPen INJECT 15 UNITS NINTO THE SKIN BEFORE EVERY A MEAL 15  mL 1   Insulin  Glargine w/ Trans Port (BASAGLAR  TEMPO PEN) 100 UNIT/ML SOPN 22 U SQ qd 15 mL 3   Insulin  Pen Needle (SURE COMFORT PEN NEEDLES) 32G X 4 MM MISC USE FOUR TIMES DAILY. 200 each 5   ipratropium (ATROVENT ) 0.03 % nasal spray Place 2 sprays into both nostrils every 12 (twelve) hours. 30 mL 12   levothyroxine  (SYNTHROID ) 137 MCG tablet Take 1-1/2 tabs on Mondays Wednesdays and Fridays and 1 tab all other days. 96 tablet 1   losartan  (COZAAR ) 100 MG tablet Take 1 tablet (100 mg total) by mouth daily. 90 tablet 1   meloxicam  (MOBIC ) 15 MG tablet TAKE ONE TABLET BY MOUTH DAILY 30 tablet 2   montelukast  (SINGULAIR ) 10 MG tablet Take 1 tablet (10 mg total) by mouth at bedtime. 14 tablet 0   oxyCODONE  (OXY IR/ROXICODONE ) 5 MG immediate release tablet 1-2 tabs po qid prn 90 tablet 0   pantoprazole  (PROTONIX ) 40 MG tablet Take 1 tablet (40 mg total) by mouth daily. 90 tablet 1   sildenafil  (VIAGRA ) 100 MG tablet Take 1 tablet (100 mg total) by mouth daily as needed for erectile dysfunction. 10 tablet 11   tamsulosin  (FLOMAX ) 0.4 MG CAPS capsule Take 1 capsule (0.4 mg total) by mouth daily after supper. 90 capsule 1   metoprolol  succinate (TOPROL -XL) 25 MG 24 hr tablet TAKE ONE TABLET BY MOUTH EVERY DAY 90 tablet 1   No facility-administered medications prior to visit.    Allergies  Allergen Reactions   Amlodipine  Other (See Comments)    Headache   Augmentin  [Amoxicillin -Pot Clavulanate] Nausea And Vomiting   Oxycodone -Acetaminophen      REACTION: itching   Levothyroxine  Rash    Had rash associated with generic levothyroxine , but not brand-name Synthroid .  Likely allergic to a dye or filler in the generic preparation.   Lisinopril Cough    Review of Systems As per HPI  PE:    03/21/2023    8:55 AM 03/21/2023    8:46 AM 02/21/2023    1:22 PM  Vitals with BMI  Height  5' 5   Weight  155 lbs 10 oz   BMI  25.89   Systolic 143 148 845  Diastolic 88 104 84  Pulse  85       Physical Exam  VS: noted--normal. Gen: alert, NAD, NONTOXIC APPEARING. HEENT: eyes without injection, drainage, or swelling.  Ears: EACs clear, TMs with normal light reflex and landmarks.  Nose: Nasal mucosa without swelling or erythema.  He does have some purulent mucus in the left nostril.  Deviated septum to the right, decreased airflow of the right nostril.  No purulent d/c.  No paranasal sinus TTP.  No facial swelling.  Throat and mouth without focal lesion.  No pharyngial swelling, erythema, or exudate.   Neck: supple, no LAD.   LUNGS: CTA bilat, nonlabored resps.   CV: RRR, no m/r/g. EXT: no c/c/e SKIN: no rash   LABS:  Last CBC Lab Results  Component Value Date   WBC 8.6 06/17/2022   HGB 15.1 06/17/2022   HCT 44.5 06/17/2022   MCV 90.8 06/17/2022   MCH 31.5 12/01/2020   RDW 13.7 06/17/2022   PLT 293.0 06/17/2022   Last metabolic panel Lab Results  Component Value Date   GLUCOSE 166 (H) 09/18/2022   NA 128 (L) 09/18/2022   K 4.7 09/18/2022   CL 92 (L) 09/18/2022   CO2 30 09/18/2022   BUN 13 09/18/2022   CREATININE 1.07 09/18/2022   GFR 76.94 09/18/2022   CALCIUM  8.9 09/18/2022   PROT 6.7 06/17/2022   ALBUMIN 4.1 06/17/2022   BILITOT 0.8 06/17/2022   ALKPHOS 145 (H) 06/17/2022   AST 15 06/17/2022   ALT 16 06/17/2022   ANIONGAP 10 09/15/2018   Last lipids Lab Results  Component Value Date   CHOL 105 06/17/2022   HDL 57.00 06/17/2022   LDLCALC 38 06/17/2022   TRIG 48.0 06/17/2022   CHOLHDL 2 06/17/2022   Last hemoglobin A1c Lab Results  Component Value Date   HGBA1C 7.1 (A) 12/19/2022   HGBA1C 7.1 12/19/2022   HGBA1C 7.1 (A) 12/19/2022   HGBA1C 7.1 (A) 12/19/2022   Last thyroid  functions Lab Results  Component Value Date   TSH 3.03 09/18/2022   T3TOTAL 91 01/12/2021   IMPRESSION AND PLAN:  #1 chronic sinusitis. Will do trial of Levaquin  750 mg a day x 14 days. Continue Flonase , Atrovent  nasal spray, and saline nasal rinses. Could  consider trial of prednisone  in the future. He brought some nasal mucus that we could consider sending for culture.  2. 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 Point-of-care hemoglobin A1c today is 7.3%. He will try to improve his diet significantly.  Continue with 22 units of Basaglar  daily.  He uses only 6-7 Humalog  with each meal. Basic metabolic panel today.  3. acquired hypothyroidism. TSH 3.03 about 6 months ago. Taking 137 mcg levothyroxine  tab, 1-1/2 tabs on Mondays Wednesdays and Fridays and 1 tab all other days. Rpt TSH today.  4. chronic pain syndrome. 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 Stable. Continue oxycodone  5 mg, 1-2 twice daily as needed, number 90/month.  A new prescription was not needed today. UDS today.  #5 hypertension, not well-controlled. Will increase his Toprol -XL to 50 mg a day.  Continue losartan  100 mg a day.  An After Visit Summary was printed and given to the patient.  FOLLOW UP: Return in about 2 weeks (around 04/04/2023) for f/u sinuses and bp. Next CPE 06/2023 Signed:  Gerlene Hockey, MD           03/21/2023

## 2023-03-23 LAB — DM TEMPLATE

## 2023-03-23 LAB — DRUG MONITORING PANEL 376104, URINE
Amphetamines: NEGATIVE ng/mL (ref <500)
Barbiturates: NEGATIVE ng/mL (ref <300)
Benzodiazepines: NEGATIVE ng/mL (ref <100)
Cocaine Metabolite: NEGATIVE ng/mL (ref <150)
Codeine: NEGATIVE ng/mL (ref <50)
Desmethyltramadol: NEGATIVE ng/mL (ref <100)
Hydrocodone: NEGATIVE ng/mL (ref <50)
Hydromorphone: NEGATIVE ng/mL (ref <50)
Morphine: NEGATIVE ng/mL (ref <50)
Norhydrocodone: NEGATIVE ng/mL (ref <50)
Noroxycodone: 672 ng/mL — ABNORMAL HIGH (ref <50)
Opiates: NEGATIVE ng/mL (ref <100)
Oxycodone: NEGATIVE ng/mL (ref <50)
Oxycodone: POSITIVE ng/mL — AB (ref <100)
Oxymorphone: 153 ng/mL — ABNORMAL HIGH (ref <50)
Tramadol: NEGATIVE ng/mL (ref <100)

## 2023-03-24 MED ORDER — LEVOTHYROXINE SODIUM 175 MCG PO TABS: 175.0000 ug | ORAL_TABLET | Freq: Every day | ORAL | 1 refills | Status: DC

## 2023-03-24 NOTE — Addendum Note (Signed)
 Addended by: Margaretta Shaw A on: 03/24/2023 11:46 AM   Modules accepted: Orders

## 2023-03-31 ENCOUNTER — Encounter (HOSPITAL_BASED_OUTPATIENT_CLINIC_OR_DEPARTMENT_OTHER): Payer: Self-pay

## 2023-03-31 ENCOUNTER — Emergency Department (HOSPITAL_BASED_OUTPATIENT_CLINIC_OR_DEPARTMENT_OTHER)
Admission: EM | Admit: 2023-03-31 | Discharge: 2023-03-31 | Disposition: A | Discharge status: Home / Self Care | Payer: 59 | Attending: Emergency Medicine | Admitting: Emergency Medicine

## 2023-03-31 DIAGNOSIS — F17210 Nicotine dependence, cigarettes, uncomplicated: Secondary | ICD-10-CM | POA: Insufficient documentation

## 2023-03-31 DIAGNOSIS — E039 Hypothyroidism, unspecified: Secondary | ICD-10-CM | POA: Insufficient documentation

## 2023-03-31 DIAGNOSIS — R040 Epistaxis: Secondary | ICD-10-CM | POA: Diagnosis present

## 2023-03-31 MED ORDER — OXYMETAZOLINE HCL 0.05 % NA SOLN
1.0000 | Freq: Once | NASAL | Status: AC
  Administered 2023-03-31: 1 via NASAL
  Filled 2023-03-31: qty 30

## 2023-03-31 NOTE — Discharge Instructions (Signed)
You were evaluated in the Emergency Department and after careful evaluation, we did not find any emergent condition requiring admission or further testing in the hospital.  Your exam/testing today is overall reassuring.  Recommend firm pressure for 20 minutes if the bleeding comes back.  Can use the Afrin as needed.  Please return to the Emergency Department if you experience any worsening of your condition.   Thank you for allowing [place] to be a part of your care.

## 2023-03-31 NOTE — ED Triage Notes (Signed)
Pt POV d/t nosebleed bilat nares.  Pt states he went to ENT recently for some sinus problems.  Pt is NOT on blood thinners.  Onset 2230 - tried everything.

## 2023-03-31 NOTE — ED Provider Notes (Signed)
[facility] Emergency Department Provider Note MRN:  [id number]  Arrival date & time: 03/31/23     Chief Complaint   Epistaxis   History of Present Illness   Daniel Stevenson is a 58 y.o. year-old male with no pertinent past medical history presenting to the ED with chief complaint of epistaxis.  Nosebleed started yesterday morning, went away after couple hours.  Came back at about 10:30 PM and has been persistent for the past 4 hours despite attempts to stop it at home, here for help.  Does not take blood thinners.  Had a recent sinus infection.  Bleeding started right after he blew his nose.  Review of Systems  A thorough review of systems was obtained and all systems are negative except as noted in the HPI and PMH.   Patient's Health History    Past Medical History:  Diagnosis Date   Back pain    s/p back surgery   Carpal tunnel syndrome on both sides    Cervical spinal stenosis    multilevel (CT 08/2022)   History of low back pain 06/24/2011   Hyperlipidemia    Hypothyroidism    LADA (latent autoimmune diabetes in adults), managed as type 1 (HCC) Dx'd 2014   Dr. Morrison Old.   Migraines    Neuropathic pain of right lower extremity    Osteoarthritis of hands, bilateral    Osteoarthritis of wrists, bilateral    Paresthesia    2024.  Right side of face and right side of neck-->intermittent, imaging ok.   PLANTAR WART, RIGHT 10/09/2009   Snoring    Sleep study neg  OSA 04/2018   Tobacco dependence    quit 2012; restarted not long after    Past Surgical History:  Procedure Laterality Date   BACK SURGERY  02/12/2007   lower lumbar fusion- rubber disc in back   CARDIOVASCULAR STRESS TEST  03/21/2014   No ischemia/low risk study; however, LV function showed EF 49% and global LV hypokinesis--echo recommended and it showed normal LV fxn.   CARPAL TUNNEL RELEASE     bilat 2023/24   NASAL SINUS SURGERY  02/11/2005   Polysomnogram  04/2018   NO OSA (Dr.  Frances Furbish)   TRANSTHORACIC ECHOCARDIOGRAM  03/24/2014   Normal LV fxn, EF 50-55%, no wall motion abnormalities: (grade 1 diast dysfxn)   WRIST SURGERY     2023/24 L wrist [facility] arthroplasty    Family History  Problem Relation Age of Onset   Emphysema Mother        smoker   Obesity Father     Social History   Socioeconomic History   Marital status: Married    Spouse name: Not on file   Number of children: Not on file   Years of education: Not on file   Highest education level: 12th grade  Occupational History   Not on file  Tobacco Use   Smoking status: Every Day    Current packs/day: 0.00    Average packs/day: 1 pack/day for 35.0 years (35.0 ttl pk-yrs)    Types: Cigarettes    Start date: 12/19/1975    Last attempt to quit: 12/19/2010    Years since quitting: 12.2   Smokeless tobacco: Never  Vaping Use   Vaping status: Never Used  Substance and Sexual Activity   Alcohol use: Yes    Comment: rarely   Drug use: No   Sexual activity: Not on file  Other Topics Concern   Not on file  Social History Narrative   Married, 1 biologic child, 3 step children (all grown).   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   Smoker: 1-2 ppd x 17 yrs.  Rare ETOH, no drugs.   No exercise.         Social Drivers of Corporate investment banker Strain: Low Risk  (01/14/2022)   Overall Financial Resource Strain (CARDIA)    Difficulty of Paying Living Expenses: Not hard at all  Food Insecurity: No Food Insecurity (01/14/2022)   Hunger Vital Sign    Worried About Running Out of Food in the Last Year: Never true    Ran Out of Food in the Last Year: Never true  Transportation Needs: No Transportation Needs (01/14/2022)   PRAPARE - Administrator, Civil Service (Medical): No    Lack of Transportation (Non-Medical): No  Physical Activity: Unknown (01/14/2022)   Exercise Vital Sign    Days of Exercise per Week: Patient declined    Minutes of Exercise per  Session: Not on file  Stress: No Stress Concern Present (01/14/2022)   [employer] of Occupational Health - Occupational Stress Questionnaire    Feeling of Stress : Only a little  Social Connections: Moderately Isolated (01/14/2022)   Social Connection and Isolation Panel [NHANES]    Frequency of Communication with Friends and Family: More than three times a week    Frequency of Social Gatherings with Friends and Family: Twice a week    Attends Religious Services: Never    Database administrator or Organizations: No    Attends Engineer, structural: Not on file    Marital Status: Married  Intimate Partner Violence: Unknown (05/16/2021)   Received from [employer], [facility]   HITS    Physically Hurt: Not on file    Insult or Talk Down To: Not on file    Threaten Physical Harm: Not on file    Scream or Curse: Not on file     Physical Exam   Vitals:   03/31/23 0215  BP: (!) 165/101  Pulse: 72  Resp: 18  Temp: (!) 97.3 F (36.3 C)  SpO2: 100%    CONSTITUTIONAL: Well-appearing, NAD NEURO/PSYCH:  Alert and oriented x 3, no focal deficits EYES:  eyes equal and reactive ENT/NECK:  no LAD, no JVD CARDIO: Regular rate, well-perfused, normal S1 and S2 PULM:  CTAB no wheezing or rhonchi GI/GU:  non-distended, non-tender MSK/SPINE:  No gross deformities, no edema SKIN:  no rash, atraumatic   *Additional and/or pertinent findings included in MDM below  Diagnostic and Interventional Summary    EKG Interpretation Date/Time:    Ventricular Rate:    PR Interval:    QRS Duration:    QT Interval:    QTC Calculation:   R Axis:      Text Interpretation:         Labs Reviewed - No data to display  No orders to display    Medications  oxymetazoline (AFRIN) 0.05 % nasal spray 1 spray (1 spray Each Nare Given 03/31/23 0219)     Procedures  /  Critical Care Procedures  ED Course and Medical Decision Making  Initial Impression and Ddx In triage patient was  advised to blow his nose and he evacuated some clots.  He was then sprayed with Afrin intranasally.  On my initial exam patient is without any bleeding.  Hemostasis achieved, really no complaints at this time.  Past medical/surgical history  that increases complexity of ED encounter: None  Interpretation of Diagnostics Laboratory and/or imaging options to aid in the diagnosis/care of the patient were considered.  After careful history and physical examination, it was determined that there was no indication for diagnostics at this time.  Patient Reassessment and Ultimate Disposition/Management     Hemostasis continues, no indication for further testing or admission, appropriate for discharge.  Patient management required discussion with the following services or consulting groups:  None  Complexity of Problems Addressed Acute illness or injury that poses threat of life of bodily function  Additional Data Reviewed and Analyzed Further history obtained from: Further history from spouse/family member  Additional Factors Impacting ED Encounter Risk None  Elmer Sow. Pilar Plate, MD [facility] Emergency Medicine [facility] [email or web address]  Final Clinical Impressions(s) / ED Diagnoses     ICD-10-CM   1. Epistaxis  R04.0       ED Discharge Orders     None        Discharge Instructions Discussed with and Provided to Patient:    Discharge Instructions      You were evaluated in the Emergency Department and after careful evaluation, we did not find any emergent condition requiring admission or further testing in the hospital.  Your exam/testing today is overall reassuring.  Recommend firm pressure for 20 minutes if the bleeding comes back.  Can use the Afrin as needed.  Please return to the Emergency Department if you experience any worsening of your condition.   Thank you for allowing [place] to be a part of your care.      Sabas Sous, MD 03/31/23 [phone]

## 2023-04-03 NOTE — Patient Instructions (Signed)

## 2023-04-04 ENCOUNTER — Ambulatory Visit (INDEPENDENT_AMBULATORY_CARE_PROVIDER_SITE_OTHER): Payer: 59 | Admitting: Family Medicine

## 2023-04-04 ENCOUNTER — Encounter: Payer: Self-pay | Admitting: Family Medicine

## 2023-04-04 VITALS — BP 136/80 | HR 69 | Temp 97.6°F | Ht 65.0 in | Wt 154.8 lb

## 2023-04-04 DIAGNOSIS — J0101 Acute recurrent maxillary sinusitis: Secondary | ICD-10-CM | POA: Diagnosis not present

## 2023-04-04 DIAGNOSIS — I10 Essential (primary) hypertension: Secondary | ICD-10-CM | POA: Diagnosis not present

## 2023-04-04 DIAGNOSIS — E039 Hypothyroidism, unspecified: Secondary | ICD-10-CM

## 2023-04-04 MED ORDER — METOPROLOL SUCCINATE ER 100 MG PO TB24
100.0000 mg | ORAL_TABLET | Freq: Every day | ORAL | 0 refills | Status: DC
Start: 2023-04-04 — End: 2023-05-02

## 2023-04-04 NOTE — Progress Notes (Signed)
OFFICE VISIT  04/04/2023  CC: No chief complaint on file.   Patient is a 58 y.o. male who presents for 2-week follow-up sinusitis, hypothyroidism, and uncontrolled hypertension. A/P as of last visit: "#1 chronic sinusitis. Will do trial of Levaquin 750 mg a day x 14 days. Continue Flonase, Atrovent nasal spray, and saline nasal rinses. Could consider trial of prednisone in the future. He brought some nasal mucus that we could consider sending for culture.   2. 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 Point-of-care hemoglobin A1c today is 7.3%. He will try to improve his diet significantly.  Continue with 22 units of Basaglar daily.  He uses only 6-7 Humalog with each meal. Basic metabolic panel today.   3. acquired hypothyroidism. TSH 3.03 about 6 months ago. Taking 137 mcg levothyroxine tab, 1-1/2 tabs on Mondays Wednesdays and Fridays and 1 tab all other days. Rpt TSH today.   4. chronic pain syndrome. 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 Stable. Continue oxycodone 5 mg, 1-2 twice daily as needed, number 90/month.  A new prescription was not needed today. UDS today.   #5 hypertension, not well-controlled. Will increase his Toprol-XL to 50 mg a day.  Continue losartan 100 mg a day."  INTERIM HX: All labs stable 2 weeks ago except TSH was 10.99.  I changed his levothyroxine regimen to 175 mcg, 1 tab daily. Taking 175 mcg levoth w/out problems.  Feeling well now, no more sinus/nasal congestion or mucous. Finished levaquin today. Had a nosebleed 2/17, went to ED, afrin x 1 given and no further bleeding.  No bleeding since then and he is not taking afrin.  Taking astelin bid.    Past Medical History:  Diagnosis Date   Back pain    s/p back surgery   Carpal tunnel syndrome on both sides    Cervical spinal stenosis    multilevel (CT 08/2022)   History of low back pain 06/24/2011   Hyperlipidemia    Hypothyroidism    LADA (latent  autoimmune diabetes in adults), managed as type 1 (HCC) Dx'd 2014   Dr. Morrison Old.   Migraines    Neuropathic pain of right lower extremity    Osteoarthritis of hands, bilateral    Osteoarthritis of wrists, bilateral    Paresthesia    2024.  Right side of face and right side of neck-->intermittent, imaging ok.   PLANTAR WART, RIGHT 10/09/2009   Snoring    Sleep study neg  OSA 04/2018   Tobacco dependence    quit 2012; restarted not long after    Past Surgical History:  Procedure Laterality Date   BACK SURGERY  02/12/2007   lower lumbar fusion- rubber disc in back   CARDIOVASCULAR STRESS TEST  03/21/2014   No ischemia/low risk study; however, LV function showed EF 49% and global LV hypokinesis--echo recommended and it showed normal LV fxn.   CARPAL TUNNEL RELEASE     bilat 2023/24   NASAL SINUS SURGERY  02/11/2005   Polysomnogram  04/2018   NO OSA (Dr. Frances Furbish)   TRANSTHORACIC ECHOCARDIOGRAM  03/24/2014   Normal LV fxn, EF 50-55%, no wall motion abnormalities: (grade 1 diast dysfxn)   WRIST SURGERY     2023/24 L wrist [facility] arthroplasty    Outpatient Medications Prior to Visit  Medication Sig Dispense Refill   amitriptyline (ELAVIL) 25 MG tablet TAKE ONE TABLET BY MOUTH EVERY MORNING AND TAKE THREE TABLETS AT BEDTIME for leg pain 360 tablet 1   atorvastatin (LIPITOR)  20 MG tablet Take 1 tablet (20 mg total) by mouth daily. 90 tablet 3   buPROPion (WELLBUTRIN XL) 150 MG 24 hr tablet Take 1 tablet (150 mg total) by mouth daily. 90 tablet 3   citalopram (CELEXA) 40 MG tablet Take 1 tablet (40 mg total) by mouth daily. 90 tablet 1   Continuous Glucose Sensor (FREESTYLE LIBRE 2 SENSOR) MISC APPLY ROUTE EVERY 14 DAYS. 2 each 5   fluticasone (FLONASE) 50 MCG/ACT nasal spray Place 2 sprays into both nostrils daily. 16 g 3   insulin aspart (NOVOLOG FLEXPEN) 100 UNIT/ML FlexPen INJECT 15 UNITS NINTO THE SKIN BEFORE EVERY A MEAL 15 mL 1   Insulin Glargine w/ Trans Port (BASAGLAR TEMPO PEN) 100  UNIT/ML SOPN 22 U SQ qd 15 mL 3   Insulin Pen Needle (SURE COMFORT PEN NEEDLES) 32G X 4 MM MISC USE FOUR TIMES DAILY. 200 each 5   ipratropium (ATROVENT) 0.03 % nasal spray Place 2 sprays into both nostrils every 12 (twelve) hours. 30 mL 12   levothyroxine (SYNTHROID) 175 MCG tablet Take 1 tablet (175 mcg total) by mouth daily. 90 tablet 1   losartan (COZAAR) 100 MG tablet Take 1 tablet (100 mg total) by mouth daily. 90 tablet 1   meloxicam (MOBIC) 15 MG tablet TAKE ONE TABLET BY MOUTH DAILY 30 tablet 2   montelukast (SINGULAIR) 10 MG tablet Take 1 tablet (10 mg total) by mouth at bedtime. 14 tablet 0   oxyCODONE (OXY IR/ROXICODONE) 5 MG immediate release tablet 1-2 tabs po qid prn 90 tablet 0   pantoprazole (PROTONIX) 40 MG tablet Take 1 tablet (40 mg total) by mouth daily. 90 tablet 1   sildenafil (VIAGRA) 100 MG tablet Take 1 tablet (100 mg total) by mouth daily as needed for erectile dysfunction. 10 tablet 11   tamsulosin (FLOMAX) 0.4 MG CAPS capsule Take 1 capsule (0.4 mg total) by mouth daily after supper. 90 capsule 1   guaiFENesin (MUCINEX) 600 MG 12 hr tablet Take 1 tablet (600 mg total) by mouth 2 (two) times daily as needed for cough or to loosen phlegm. 20 tablet 0   levothyroxine (SYNTHROID) 137 MCG tablet Take 1-1/2 tabs on Mondays Wednesdays and Fridays and 1 tab all other days. 96 tablet 1   metoprolol succinate (TOPROL XL) 50 MG 24 hr tablet Take 1 tablet (50 mg total) by mouth daily. Take with or immediately following a meal. 30 tablet 0   citalopram (CELEXA) 40 MG tablet Take 1 tablet (40 mg total) by mouth daily. 90 tablet 1   levofloxacin (LEVAQUIN) 750 MG tablet Take 1 tablet (750 mg total) by mouth daily. (Patient not taking: Reported on 04/04/2023) 14 tablet 0   No facility-administered medications prior to visit.    Allergies  Allergen Reactions   Amlodipine Other (See Comments)    Headache   Augmentin [Amoxicillin-Pot Clavulanate] Nausea And Vomiting    Oxycodone-Acetaminophen     REACTION: itching   Levothyroxine Rash    Had rash associated with generic levothyroxine, but not brand-name Synthroid.  Likely allergic to a dye or filler in the generic preparation.   Lisinopril Cough    Review of Systems As per HPI  PE:    04/04/2023   10:03 AM 04/04/2023    9:56 AM 03/31/2023    3:00 AM  Vitals with BMI  Height  5\' 5"    Weight  154 lbs 13 oz   BMI  25.76   Systolic 136 142 811  Diastolic 80 89 97  Pulse  69 61    Physical Exam  Gen: Alert, well appearing.  Patient is oriented to person, place, time, and situation. AFFECT: pleasant, lucid thought and speech. Nasal passages clear and without erythema.  Small scab on the septal aspect of left nostril. Cardiovascular: Regular rhythm and rate without murmur, rate around 70.  LABS:  Last CBC Lab Results  Component Value Date   WBC 8.6 06/17/2022   HGB 15.1 06/17/2022   HCT 44.5 06/17/2022   MCV 90.8 06/17/2022   MCH 31.5 12/01/2020   RDW 13.7 06/17/2022   PLT 293.0 06/17/2022   Last metabolic panel Lab Results  Component Value Date   GLUCOSE 159 (H) 03/21/2023   NA 131 (L) 03/21/2023   K 4.8 03/21/2023   CL 89 (L) 03/21/2023   CO2 34 (H) 03/21/2023   BUN 10 03/21/2023   CREATININE 1.13 03/21/2023   GFR 71.81 03/21/2023   CALCIUM 9.1 03/21/2023   PROT 6.7 06/17/2022   ALBUMIN 4.1 06/17/2022   BILITOT 0.8 06/17/2022   ALKPHOS 145 (H) 06/17/2022   AST 15 06/17/2022   ALT 16 06/17/2022   ANIONGAP 10 09/15/2018   Last lipids Lab Results  Component Value Date   CHOL 105 06/17/2022   HDL 57.00 06/17/2022   LDLCALC 38 06/17/2022   TRIG 48.0 06/17/2022   CHOLHDL 2 06/17/2022   Last hemoglobin A1c Lab Results  Component Value Date   HGBA1C 7.3 (A) 03/21/2023   HGBA1C 7.3 03/21/2023   HGBA1C 7.3 (A) 03/21/2023   HGBA1C 7.3 (A) 03/21/2023   Last thyroid functions Lab Results  Component Value Date   TSH 10.99 (H) 03/21/2023   T3TOTAL 91 01/12/2021    IMPRESSION AND PLAN:  #1 hypertension, uncontrolled but improving. Increase Toprol-XL to 100 mg a day.  Continue losartan 100 mg a day.  #2 acute sinusitis, resolved with Levaquin. Continue Astelin nasal spray every 12 hours as needed.  Currently not taking Flonase and okay to stay off this for now. Encouraged use of saline nasal spray or gel for moisturization.  #3 acquired hypothyroidism. Continue levothyroxine 175 mcg tab daily and recheck TSH in 4 to 6 weeks.  An After Visit Summary was printed and given to the patient.  FOLLOW UP: Return in about 4 weeks (around 05/02/2023) for f/u HTN.  Signed:  Santiago Bumpers, MD           04/04/2023

## 2023-04-09 ENCOUNTER — Other Ambulatory Visit: Payer: Self-pay | Admitting: Family Medicine

## 2023-04-10 ENCOUNTER — Other Ambulatory Visit: Payer: Self-pay | Admitting: Family Medicine

## 2023-04-14 MED ORDER — OXYCODONE HCL 5 MG PO TABS: ORAL_TABLET | ORAL | 0 refills | Status: DC

## 2023-04-14 NOTE — Telephone Encounter (Signed)
 Requesting: oxycodone 5mg  Contract: N/A UDS: 03/21/23 Last Visit: 04/04/23 Next Visit: 05/02/23 Last Refill:  Please Advise. Med pending

## 2023-04-17 ENCOUNTER — Encounter: Payer: Self-pay | Admitting: Family Medicine

## 2023-04-17 DIAGNOSIS — J329 Chronic sinusitis, unspecified: Secondary | ICD-10-CM

## 2023-04-18 MED ORDER — LEVOFLOXACIN 750 MG PO TABS
750.0000 mg | ORAL_TABLET | Freq: Every day | ORAL | 0 refills | Status: DC
Start: 2023-04-18 — End: 2023-05-02

## 2023-04-18 NOTE — Telephone Encounter (Signed)
 Okay, Levaquin prescription sent. ENT referral ordered.

## 2023-04-21 ENCOUNTER — Other Ambulatory Visit: Payer: Self-pay | Admitting: Family Medicine

## 2023-05-01 NOTE — Patient Instructions (Signed)

## 2023-05-02 ENCOUNTER — Encounter: Payer: Self-pay | Admitting: Family Medicine

## 2023-05-02 ENCOUNTER — Ambulatory Visit (INDEPENDENT_AMBULATORY_CARE_PROVIDER_SITE_OTHER): Payer: 59 | Admitting: Family Medicine

## 2023-05-02 VITALS — BP 140/82 | HR 62 | Temp 97.6°F | Ht 65.0 in | Wt 153.2 lb

## 2023-05-02 DIAGNOSIS — I10 Essential (primary) hypertension: Secondary | ICD-10-CM | POA: Diagnosis not present

## 2023-05-02 DIAGNOSIS — E039 Hypothyroidism, unspecified: Secondary | ICD-10-CM

## 2023-05-02 LAB — TSH: TSH: 0.43 u[IU]/mL (ref 0.35–5.50)

## 2023-05-02 MED ORDER — METOPROLOL SUCCINATE ER 100 MG PO TB24: ORAL_TABLET | ORAL | 0 refills | Status: DC

## 2023-05-02 MED ORDER — MELOXICAM 15 MG PO TABS: 15.0000 mg | ORAL_TABLET | Freq: Every day | ORAL | 2 refills | Status: DC

## 2023-05-02 NOTE — Progress Notes (Signed)
 OFFICE VISIT  05/02/2023  CC:  Chief Complaint  Patient presents with   Medical Management of Chronic Issues    Patient is a 58 y.o. male who presents for 1 month follow-up uncontrolled hypertension and hypothyroidism. A/P as of last visit: "#1 hypertension, uncontrolled but improving. Increase Toprol-XL to 100 mg a day.  Continue losartan 100 mg a day.   #2 acute sinusitis, resolved with Levaquin. Continue Astelin nasal spray every 12 hours as needed.  Currently not taking Flonase and okay to stay off this for now. Encouraged use of saline nasal spray or gel for moisturization.   #3 acquired hypothyroidism. Continue levothyroxine 175 mcg tab daily and recheck TSH in 4 to 6 weeks."  INTERIM HX: Daniel Stevenson feels well. Home blood pressures in the 130s to 140s systolics over around 80 diastolic.  Occasionally 90 diastolic. No episodes of hypotension.  He just finished another course of Levaquin for sinusitis and says it did help significantly.   Past Medical History:  Diagnosis Date   Back pain    s/p back surgery   Carpal tunnel syndrome on both sides    Cervical spinal stenosis    multilevel (CT 08/2022)   History of low back pain 06/24/2011   Hyperlipidemia    Hypothyroidism    LADA (latent autoimmune diabetes in adults), managed as type 1 (HCC) Dx'd 2014   Dr. Morrison Old.   Migraines    Neuropathic pain of right lower extremity    Osteoarthritis of hands, bilateral    Osteoarthritis of wrists, bilateral    Paresthesia    2024.  Right side of face and right side of neck-->intermittent, imaging ok.   PLANTAR WART, RIGHT 10/09/2009   Snoring    Sleep study neg  OSA 04/2018   Tobacco dependence    quit 2012; restarted not long after    Past Surgical History:  Procedure Laterality Date   BACK SURGERY  02/12/2007   lower lumbar fusion- rubber disc in back   CARDIOVASCULAR STRESS TEST  03/21/2014   No ischemia/low risk study; however, LV function showed EF 49% and global LV  hypokinesis--echo recommended and it showed normal LV fxn.   CARPAL TUNNEL RELEASE     bilat 2023/24   NASAL SINUS SURGERY  02/11/2005   Polysomnogram  04/2018   NO OSA (Dr. Frances Furbish)   TRANSTHORACIC ECHOCARDIOGRAM  03/24/2014   Normal LV fxn, EF 50-55%, no wall motion abnormalities: (grade 1 diast dysfxn)   WRIST SURGERY     2023/24 L wrist [facility] arthroplasty    Outpatient Medications Prior to Visit  Medication Sig Dispense Refill   amitriptyline (ELAVIL) 25 MG tablet TAKE ONE TABLET BY MOUTH EVERY MORNING AND TAKE THREE TABLETS AT BEDTIME for leg pain 360 tablet 1   atorvastatin (LIPITOR) 20 MG tablet Take 1 tablet (20 mg total) by mouth daily. 90 tablet 3   buPROPion (WELLBUTRIN XL) 150 MG 24 hr tablet Take 1 tablet (150 mg total) by mouth daily. 90 tablet 3   citalopram (CELEXA) 40 MG tablet Take 1 tablet (40 mg total) by mouth daily. 90 tablet 1   Continuous Glucose Sensor (FREESTYLE LIBRE 2 SENSOR) MISC APPLY ROUTE EVERY 14 DAYS. 2 each 5   fluticasone (FLONASE) 50 MCG/ACT nasal spray Place 2 sprays into both nostrils daily. 16 g 3   insulin aspart (NOVOLOG FLEXPEN) 100 UNIT/ML FlexPen INJECT 15 UNITS NINTO THE SKIN BEFORE EVERY A MEAL 15 mL 1   Insulin Glargine w/ Trans Port (BASAGLAR TEMPO  PEN) 100 UNIT/ML SOPN 22 U SQ qd 15 mL 3   Insulin Pen Needle (SURE COMFORT PEN NEEDLES) 32G X 4 MM MISC USE FOUR TIMES DAILY. 200 each 5   ipratropium (ATROVENT) 0.03 % nasal spray Place 2 sprays into both nostrils every 12 (twelve) hours. 30 mL 12   levothyroxine (SYNTHROID) 175 MCG tablet Take 1 tablet (175 mcg total) by mouth daily. 90 tablet 1   losartan (COZAAR) 100 MG tablet Take 1 tablet (100 mg total) by mouth daily. 90 tablet 1   montelukast (SINGULAIR) 10 MG tablet Take 1 tablet (10 mg total) by mouth at bedtime. 14 tablet 0   oxyCODONE (OXY IR/ROXICODONE) 5 MG immediate release tablet 1-2 tabs po qid prn 90 tablet 0   pantoprazole (PROTONIX) 40 MG tablet Take 1 tablet (40 mg total) by  mouth daily. 90 tablet 1   sildenafil (VIAGRA) 100 MG tablet Take 1 tablet (100 mg total) by mouth daily as needed for erectile dysfunction. 10 tablet 11   tamsulosin (FLOMAX) 0.4 MG CAPS capsule Take 1 capsule (0.4 mg total) by mouth daily after supper. 90 capsule 1   meloxicam (MOBIC) 15 MG tablet TAKE ONE TABLET BY MOUTH DAILY 30 tablet 2   metoprolol succinate (TOPROL-XL) 100 MG 24 hr tablet Take 1 tablet (100 mg total) by mouth daily. Take with or immediately following a meal. 30 tablet 0   levofloxacin (LEVAQUIN) 750 MG tablet Take 1 tablet (750 mg total) by mouth daily for 14 days. (Patient not taking: Reported on 05/02/2023) 14 tablet 0   No facility-administered medications prior to visit.    Allergies  Allergen Reactions   Amlodipine Other (See Comments)    Headache   Augmentin [Amoxicillin-Pot Clavulanate] Nausea And Vomiting   Oxycodone-Acetaminophen     REACTION: itching   Levothyroxine Rash    Had rash associated with generic levothyroxine, but not brand-name Synthroid.  Likely allergic to a dye or filler in the generic preparation.   Lisinopril Cough    Review of Systems As per HPI  PE:    05/02/2023    9:54 AM 04/04/2023   10:03 AM 04/04/2023    9:56 AM  Vitals with BMI  Height 5\' 5"   5\' 5"   Weight 153 lbs 3 oz  154 lbs 13 oz  BMI 25.49  25.76  Systolic 140 136 213  Diastolic 82 80 89  Pulse 62  69     Physical Exam  Gen: Alert, well appearing.  Patient is oriented to person, place, time, and situation. AFFECT: pleasant, lucid thought and speech. CV: RRR, no m/r/g.   LUNGS: CTA bilat, nonlabored resps, good aeration in all lung fields. No edema  LABS:  Last metabolic panel Lab Results  Component Value Date   GLUCOSE 159 (H) 03/21/2023   NA 131 (L) 03/21/2023   K 4.8 03/21/2023   CL 89 (L) 03/21/2023   CO2 34 (H) 03/21/2023   BUN 10 03/21/2023   CREATININE 1.13 03/21/2023   GFR 71.81 03/21/2023   CALCIUM 9.1 03/21/2023   PROT 6.7 06/17/2022    ALBUMIN 4.1 06/17/2022   BILITOT 0.8 06/17/2022   ALKPHOS 145 (H) 06/17/2022   AST 15 06/17/2022   ALT 16 06/17/2022   ANIONGAP 10 09/15/2018   Last thyroid functions Lab Results  Component Value Date   TSH 10.99 (H) 03/21/2023   T3TOTAL 91 01/12/2021   Lab Results  Component Value Date   HGBA1C 7.3 (A) 03/21/2023   HGBA1C 7.3  03/21/2023   HGBA1C 7.3 (A) 03/21/2023   HGBA1C 7.3 (A) 03/21/2023   IMPRESSION AND PLAN:  #1 hypertension, uncontrolled but improving. Increase Toprol-XL to 150 mg a day.  Continue losartan 100 mg a day.   #2 acute sinusitis, recurrent . Most recent episode resolved with Levaquin. Continue Astelin nasal spray every 12 hours as needed.  Encouraged use of saline nasal spray or gel for moisturization. He does have an ENT consultation scheduled.  #3 acquired hypothyroidism. Continue levothyroxine 175 mcg tab daily . Monitor TSH today.  DM: Next A1c approximately 06/18/2023  An After Visit Summary was printed and given to the patient.  FOLLOW UP: Return for 2-3 wks f/u HTN.  Signed:  Santiago Bumpers, MD           05/02/2023

## 2023-05-03 ENCOUNTER — Other Ambulatory Visit: Payer: Self-pay | Admitting: Family Medicine

## 2023-05-14 ENCOUNTER — Other Ambulatory Visit: Payer: Self-pay | Admitting: Family Medicine

## 2023-05-14 MED ORDER — OXYCODONE HCL 5 MG PO TABS: ORAL_TABLET | ORAL | 0 refills | Status: DC

## 2023-05-14 NOTE — Telephone Encounter (Signed)
 Requesting: oxycodone Contract: n/a for this med UDS: 03/21/23 Last Visit: 05/02/23 Next Visit: 05/16/23 Last Refill: 04/14/23 (90,0)  Please Advise. Med pending

## 2023-05-16 ENCOUNTER — Encounter: Payer: Self-pay | Admitting: Family Medicine

## 2023-05-16 ENCOUNTER — Ambulatory Visit (INDEPENDENT_AMBULATORY_CARE_PROVIDER_SITE_OTHER): Admitting: Family Medicine

## 2023-05-16 VITALS — BP 132/78 | HR 62 | Ht 65.0 in | Wt 156.8 lb

## 2023-05-16 DIAGNOSIS — I10 Essential (primary) hypertension: Secondary | ICD-10-CM

## 2023-05-16 MED ORDER — HYDRALAZINE HCL 10 MG PO TABS: ORAL_TABLET | ORAL | 1 refills | Status: DC

## 2023-05-16 NOTE — Progress Notes (Signed)
 OFFICE VISIT  05/16/2023  CC:  Chief Complaint  Patient presents with   Medical Management of Chronic Issues    2-3 week hypertension follow up    Patient is a 58 y.o. male who presents for 2-week follow-up of uncontrolled hypertension. A/P as of last visit: "#1 hypertension, uncontrolled but improving. Increase Toprol-XL to 150 mg a day.  Continue losartan 100 mg a day.   #2 acute sinusitis, recurrent . Most recent episode resolved with Levaquin. Continue Astelin nasal spray every 12 hours as needed.  Encouraged use of saline nasal spray or gel for moisturization. He does have an ENT consultation scheduled.   #3 acquired hypothyroidism. Continue levothyroxine 175 mcg tab daily . Monitor TSH today.   #4 DM: Next A1c approximately 06/18/2023"  INTERIM HX: Daniel Stevenson feels well. He has noted his blood pressure is up in the 140's to 150s over 80s to 90s in the mornings.  He then takes his medicines and checks his blood pressure in the afternoon and it is consistently less than 130/80.  He notes a little more heartburn since being on the increased dose of Toprol, otherwise no problems.   Past Medical History:  Diagnosis Date   Back pain    s/p back surgery   Carpal tunnel syndrome on both sides    Cervical spinal stenosis    multilevel (CT 08/2022)   History of low back pain 06/24/2011   Hyperlipidemia    Hypothyroidism    LADA (latent autoimmune diabetes in adults), managed as type 1 (HCC) Dx'd 2014   Dr. Morrison Old.   Migraines    Neuropathic pain of right lower extremity    Osteoarthritis of hands, bilateral    Osteoarthritis of wrists, bilateral    Paresthesia    2024.  Right side of face and right side of neck-->intermittent, imaging ok.   PLANTAR WART, RIGHT 10/09/2009   Snoring    Sleep study neg  OSA 04/2018   Tobacco dependence    quit 2012; restarted not long after    Past Surgical History:  Procedure Laterality Date   BACK SURGERY  02/12/2007   lower lumbar  fusion- rubber disc in back   CARDIOVASCULAR STRESS TEST  03/21/2014   No ischemia/low risk study; however, LV function showed EF 49% and global LV hypokinesis--echo recommended and it showed normal LV fxn.   CARPAL TUNNEL RELEASE     bilat 2023/24   NASAL SINUS SURGERY  02/11/2005   Polysomnogram  04/2018   NO OSA (Dr. Frances Furbish)   TRANSTHORACIC ECHOCARDIOGRAM  03/24/2014   Normal LV fxn, EF 50-55%, no wall motion abnormalities: (grade 1 diast dysfxn)   WRIST SURGERY     2023/24 L wrist [facility] arthroplasty    Outpatient Medications Prior to Visit  Medication Sig Dispense Refill   amitriptyline (ELAVIL) 25 MG tablet TAKE ONE TABLET BY MOUTH EVERY MORNING AND TAKE THREE TABLETS AT BEDTIME for leg pain 360 tablet 1   atorvastatin (LIPITOR) 20 MG tablet Take 1 tablet (20 mg total) by mouth daily. 90 tablet 3   buPROPion (WELLBUTRIN XL) 150 MG 24 hr tablet Take 1 tablet (150 mg total) by mouth daily. 90 tablet 3   citalopram (CELEXA) 40 MG tablet Take 1 tablet (40 mg total) by mouth daily. 90 tablet 1   Continuous Glucose Sensor (FREESTYLE LIBRE 2 SENSOR) MISC APPLY ROUTE EVERY 14 DAYS. 2 each 5   fluticasone (FLONASE) 50 MCG/ACT nasal spray Place 2 sprays into both nostrils daily. 16 g  3   insulin aspart (NOVOLOG FLEXPEN) 100 UNIT/ML FlexPen INJECT 15 UNITS NINTO THE SKIN BEFORE EVERY A MEAL 15 mL 1   Insulin Glargine w/ Trans Port (BASAGLAR TEMPO PEN) 100 UNIT/ML SOPN 22 U SQ qd 15 mL 3   Insulin Pen Needle (SURE COMFORT PEN NEEDLES) 32G X 4 MM MISC USE FOUR TIMES DAILY. 200 each 5   ipratropium (ATROVENT) 0.03 % nasal spray Place 2 sprays into both nostrils every 12 (twelve) hours. 30 mL 12   levothyroxine (SYNTHROID) 175 MCG tablet Take 1 tablet (175 mcg total) by mouth daily. 90 tablet 1   losartan (COZAAR) 100 MG tablet Take 1 tablet (100 mg total) by mouth daily. 90 tablet 1   meloxicam (MOBIC) 15 MG tablet Take 1 tablet (15 mg total) by mouth daily. 30 tablet 2   metoprolol succinate  (TOPROL-XL) 100 MG 24 hr tablet 1 tab po qAM and 1/2 tab po qPM 45 tablet 0   montelukast (SINGULAIR) 10 MG tablet Take 1 tablet (10 mg total) by mouth at bedtime. 14 tablet 0   oxyCODONE (OXY IR/ROXICODONE) 5 MG immediate release tablet 1-2 tabs po qid prn 90 tablet 0   pantoprazole (PROTONIX) 40 MG tablet Take 1 tablet (40 mg total) by mouth daily. 90 tablet 1   sildenafil (VIAGRA) 100 MG tablet Take 1 tablet (100 mg total) by mouth daily as needed for erectile dysfunction. 10 tablet 11   tamsulosin (FLOMAX) 0.4 MG CAPS capsule Take 1 capsule (0.4 mg total) by mouth daily after supper. 90 capsule 1   No facility-administered medications prior to visit.    Allergies  Allergen Reactions   Amlodipine Other (See Comments)    Headache   Augmentin [Amoxicillin-Pot Clavulanate] Nausea And Vomiting   Oxycodone-Acetaminophen     REACTION: itching   Levothyroxine Rash    Had rash associated with generic levothyroxine, but not brand-name Synthroid.  Likely allergic to a dye or filler in the generic preparation.   Lisinopril Cough    Review of Systems As per HPI  PE:    05/16/2023    1:06 PM 05/02/2023    9:54 AM 04/04/2023   10:03 AM  Vitals with BMI  Height 5\' 5"  5\' 5"    Weight 156 lbs 13 oz 153 lbs 3 oz   BMI 26.09 25.49   Systolic 132 140 469  Diastolic 78 82 80  Pulse 62 62      Physical Exam  Gen: Alert, well appearing.  Patient is oriented to person, place, time, and situation. AFFECT: pleasant, lucid thought and speech. No further exam today  LABS:  Last CBC Lab Results  Component Value Date   WBC 8.6 06/17/2022   HGB 15.1 06/17/2022   HCT 44.5 06/17/2022   MCV 90.8 06/17/2022   MCH 31.5 12/01/2020   RDW 13.7 06/17/2022   PLT 293.0 06/17/2022   Last metabolic panel Lab Results  Component Value Date   GLUCOSE 159 (H) 03/21/2023   NA 131 (L) 03/21/2023   K 4.8 03/21/2023   CL 89 (L) 03/21/2023   CO2 34 (H) 03/21/2023   BUN 10 03/21/2023   CREATININE 1.13  03/21/2023   GFR 71.81 03/21/2023   CALCIUM 9.1 03/21/2023   PROT 6.7 06/17/2022   ALBUMIN 4.1 06/17/2022   BILITOT 0.8 06/17/2022   ALKPHOS 145 (H) 06/17/2022   AST 15 06/17/2022   ALT 16 06/17/2022   ANIONGAP 10 09/15/2018   Last hemoglobin A1c Lab Results  Component Value  Date   HGBA1C 7.3 (A) 03/21/2023   HGBA1C 7.3 03/21/2023   HGBA1C 7.3 (A) 03/21/2023   HGBA1C 7.3 (A) 03/21/2023   Last thyroid functions Lab Results  Component Value Date   TSH 0.43 05/02/2023   T3TOTAL 91 01/12/2021   IMPRESSION AND PLAN:  #1 uncontrolled hypertension. Add hydralazine 10 mg every morning. Continue Toprol-XL 100 mg every morning and 50 mg every afternoon.  Continue losartan 100 mg a day.  An After Visit Summary was printed and given to the patient.  FOLLOW UP: Return in about 4 weeks (around 06/13/2023) for routine chronic illness f/u.-->  Due for hemoglobin A1c at that time  Signed:  Santiago Bumpers, MD           05/16/2023

## 2023-05-19 ENCOUNTER — Encounter: Payer: Self-pay | Admitting: Family Medicine

## 2023-05-19 DIAGNOSIS — E139 Other specified diabetes mellitus without complications: Secondary | ICD-10-CM

## 2023-05-19 MED ORDER — FREESTYLE LIBRE 2 READER DEVI: 2 refills | Status: DC

## 2023-05-19 MED ORDER — FREESTYLE LIBRE 2 SENSOR MISC: 5 refills | Status: DC

## 2023-05-21 ENCOUNTER — Other Ambulatory Visit (HOSPITAL_COMMUNITY): Payer: Self-pay

## 2023-06-12 ENCOUNTER — Other Ambulatory Visit: Payer: Self-pay | Admitting: Family Medicine

## 2023-06-12 NOTE — Telephone Encounter (Signed)
 Requesting: oxycodone  Contract: n/a for this med UDS: 03/21/23 Last Visit: 05/16/23 Next Visit: 07/08/23 Last Refill: 05/14/23 (90,0)  Please Advise. Med pending

## 2023-06-13 ENCOUNTER — Other Ambulatory Visit (HOSPITAL_COMMUNITY): Payer: Self-pay

## 2023-06-13 ENCOUNTER — Telehealth: Payer: Self-pay

## 2023-06-13 MED ORDER — OXYCODONE HCL 5 MG PO TABS
ORAL_TABLET | ORAL | 0 refills | Status: DC
Start: 2023-06-13 — End: 2023-07-14

## 2023-06-13 NOTE — Telephone Encounter (Signed)
 Pharmacy Patient Advocate Encounter   Received notification from CoverMyMeds that prior authorization for oxyCODONE  HCl 5MG  tablets is required/requested.   Insurance verification completed.   The patient is insured through [facility] .   Per test claim: PA required; PA submitted to above mentioned insurance via CoverMyMeds Key/confirmation #/EOC [id number] Status is pending

## 2023-06-16 ENCOUNTER — Other Ambulatory Visit: Payer: Self-pay | Admitting: Family Medicine

## 2023-06-16 ENCOUNTER — Other Ambulatory Visit (HOSPITAL_COMMUNITY): Payer: Self-pay

## 2023-06-18 ENCOUNTER — Telehealth: Payer: Self-pay

## 2023-06-18 ENCOUNTER — Other Ambulatory Visit (HOSPITAL_COMMUNITY): Payer: Self-pay

## 2023-06-18 NOTE — Telephone Encounter (Signed)
 Pharmacy Patient Advocate Encounter   Received notification from Patient Pharmacy that prior authorization for FreeStyle Libre 2 sensor is required/requested.   Insurance verification completed.   The patient is insured through [facility] [employer] .   Per test claim: The current 28 day co-pay is, $59.93.  No PA needed at this time. This test claim was processed through [facility]- copay amounts may vary at other pharmacies due to pharmacy/plan contracts, or as the patient moves through the different stages of their insurance plan.

## 2023-06-19 ENCOUNTER — Other Ambulatory Visit (HOSPITAL_COMMUNITY): Payer: Self-pay

## 2023-06-19 NOTE — Telephone Encounter (Signed)
 Pharmacy Patient Advocate Encounter  Received notification from [facility] that Prior Authorization for OXYCODONE  5MG  TABS has been DENIED.     Insurance rep informed, 7 tabs are the maximum allowed per day per patients plan quantity limits.  Patient filed RX on 06/13/23 at pharmacy. No PA needed.    PA #/Case ID/Reference #: [id number]

## 2023-06-26 ENCOUNTER — Other Ambulatory Visit: Payer: Self-pay | Admitting: Family Medicine

## 2023-06-27 ENCOUNTER — Other Ambulatory Visit: Payer: Self-pay | Admitting: Family Medicine

## 2023-07-02 ENCOUNTER — Other Ambulatory Visit (HOSPITAL_COMMUNITY): Payer: Self-pay

## 2023-07-02 NOTE — Telephone Encounter (Signed)
 Pharmacy Patient Advocate Encounter   Received notification from Patient Advice Request messages that prior authorization for [facility] 2 sensors is required/requested.   Insurance verification completed.   The patient is insured through [facility] .   Per test claim: submit to other processor.

## 2023-07-03 ENCOUNTER — Other Ambulatory Visit (HOSPITAL_COMMUNITY): Payer: Self-pay

## 2023-07-03 NOTE — Telephone Encounter (Signed)
 FYI has been reviewed.

## 2023-07-08 ENCOUNTER — Ambulatory Visit (INDEPENDENT_AMBULATORY_CARE_PROVIDER_SITE_OTHER): Admitting: Family Medicine

## 2023-07-08 ENCOUNTER — Encounter: Payer: Self-pay | Admitting: Family Medicine

## 2023-07-08 VITALS — BP 131/76 | HR 65 | Wt 159.4 lb

## 2023-07-08 DIAGNOSIS — Z125 Encounter for screening for malignant neoplasm of prostate: Secondary | ICD-10-CM | POA: Diagnosis not present

## 2023-07-08 DIAGNOSIS — M25531 Pain in right wrist: Secondary | ICD-10-CM

## 2023-07-08 DIAGNOSIS — G894 Chronic pain syndrome: Secondary | ICD-10-CM | POA: Diagnosis not present

## 2023-07-08 DIAGNOSIS — E139 Other specified diabetes mellitus without complications: Secondary | ICD-10-CM | POA: Diagnosis not present

## 2023-07-08 DIAGNOSIS — Z79899 Other long term (current) drug therapy: Secondary | ICD-10-CM

## 2023-07-08 DIAGNOSIS — I10 Essential (primary) hypertension: Secondary | ICD-10-CM

## 2023-07-08 DIAGNOSIS — M25532 Pain in left wrist: Secondary | ICD-10-CM

## 2023-07-08 DIAGNOSIS — M79641 Pain in right hand: Secondary | ICD-10-CM

## 2023-07-08 DIAGNOSIS — M79642 Pain in left hand: Secondary | ICD-10-CM

## 2023-07-08 LAB — LIPID PANEL
Cholesterol: 116 mg/dL (ref 0–200)
HDL: 55.2 mg/dL (ref >39.00)
LDL Cholesterol: 45 mg/dL (ref 0–99)
NonHDL: 60.58
Total CHOL/HDL Ratio: 2
Triglycerides: 77 mg/dL (ref 0.0–149.0)
VLDL: 15.4 mg/dL (ref 0.0–40.0)

## 2023-07-08 LAB — COMPREHENSIVE METABOLIC PANEL WITH GFR
ALT: 30 U/L (ref 0–53)
AST: 22 U/L (ref 0–37)
Albumin: 4.2 g/dL (ref 3.5–5.2)
Alkaline Phosphatase: 255 U/L — ABNORMAL HIGH (ref 39–117)
BUN: 16 mg/dL (ref 6–23)
CO2: 31 meq/L (ref 19–32)
Calcium: 9.4 mg/dL (ref 8.4–10.5)
Chloride: 90 meq/L — ABNORMAL LOW (ref 96–112)
Creatinine, Ser: 0.94 mg/dL (ref 0.40–1.50)
GFR: 89.37 mL/min (ref >60.00)
Glucose, Bld: 110 mg/dL — ABNORMAL HIGH (ref 70–99)
Potassium: 4.8 meq/L (ref 3.5–5.1)
Sodium: 130 meq/L — ABNORMAL LOW (ref 135–145)
Total Bilirubin: 0.7 mg/dL (ref 0.2–1.2)
Total Protein: 7 g/dL (ref 6.0–8.3)

## 2023-07-08 LAB — MICROALBUMIN / CREATININE URINE RATIO
Creatinine,U: 53.5 mg/dL
Microalb Creat Ratio: 69.5 mg/g — ABNORMAL HIGH (ref 0.0–30.0)
Microalb, Ur: 3.7 mg/dL — ABNORMAL HIGH (ref 0.0–1.9)

## 2023-07-08 LAB — POCT GLYCOSYLATED HEMOGLOBIN (HGB A1C)
HbA1c POC (<> result, manual entry): 7.2 % (ref 4.0–5.6)
HbA1c, POC (controlled diabetic range): 7.2 % — AB (ref 0.0–7.0)
HbA1c, POC (prediabetic range): 7.2 % — AB (ref 5.7–6.4)
Hemoglobin A1C: 7.2 % — AB (ref 4.0–5.6)

## 2023-07-08 LAB — PSA: PSA: 0.16 ng/mL (ref 0.10–4.00)

## 2023-07-08 MED ORDER — PANTOPRAZOLE SODIUM 40 MG PO TBEC: 40.0000 mg | DELAYED_RELEASE_TABLET | Freq: Every day | ORAL | 1 refills | Status: DC

## 2023-07-08 MED ORDER — AMITRIPTYLINE HCL 25 MG PO TABS: ORAL_TABLET | ORAL | 1 refills | Status: DC

## 2023-07-08 MED ORDER — SURE COMFORT PEN NEEDLES 32G X 4 MM MISC: 5 refills | Status: AC

## 2023-07-08 MED ORDER — LOSARTAN POTASSIUM 100 MG PO TABS: 100.0000 mg | ORAL_TABLET | Freq: Every day | ORAL | 1 refills | Status: DC

## 2023-07-08 MED ORDER — HYDRALAZINE HCL 25 MG PO TABS
25.0000 mg | ORAL_TABLET | Freq: Three times a day (TID) | ORAL | 2 refills | Status: DC
Start: 2023-07-08 — End: 2023-07-14

## 2023-07-08 MED ORDER — TAMSULOSIN HCL 0.4 MG PO CAPS: 0.4000 mg | ORAL_CAPSULE | Freq: Every day | ORAL | 1 refills | Status: DC

## 2023-07-08 MED ORDER — MELOXICAM 15 MG PO TABS: 15.0000 mg | ORAL_TABLET | Freq: Every day | ORAL | 1 refills | Status: DC

## 2023-07-08 MED ORDER — CITALOPRAM HYDROBROMIDE 40 MG PO TABS: 40.0000 mg | ORAL_TABLET | Freq: Every day | ORAL | 1 refills | Status: DC

## 2023-07-08 NOTE — Progress Notes (Signed)
 OFFICE VISIT  07/08/2023  CC:  Chief Complaint  Patient presents with   Diabetes    Patient is a 58 y.o. male who presents for 7-week follow-up diabetes and hypertension and follow-up chronic pain syndrome. A/P as of last visit: "#1 uncontrolled hypertension. Add hydralazine  10 mg every morning. Continue Toprol -XL 100 mg every morning and 50 mg every afternoon.  Continue losartan  100 mg a day."  INTERIM HX: Feeling well.  Fasting glucoses 90-120, 2-hour postprandial often up over 200 but he says it quickly goes down to normal after that.  When he tries to give himself more mealtime insulin  he often drops his sugar too low and then he ends up chasing it. Currently he typically gives anywhere from 6 to 10 units of his NovoLog  at mealtime.  Denies pain, burning, tingling, or numbness in his feet.  Home blood pressures ranging anywhere from 140-150/85 to 90s.  Chronic bilateral hands and wrist pain and chronic neck pain stable.  He takes meloxicam  15 mg a day with food. He also takes oxycodone  5 mg, 1-2 tabs p.o. 4 times daily as needed. PMP AWARE reviewed today: most recent rx for oxycodone  5 mg was filled 06/13/23, # 90, rx by me. No red flags.  ROS as above, plus--> no fevers, no CP, no SOB, no wheezing, no cough, no dizziness, no HAs, no rashes, no melena/hematochezia.  No polyuria or polydipsia.  No myalgias or arthralgias.  No focal weakness, paresthesias, or tremors.  No acute vision or hearing abnormalities.  No dysuria or unusual/new urinary urgency or frequency.  No recent changes in lower legs. No n/v/d or abd pain.  No palpitations.    Past Medical History:  Diagnosis Date   Back pain    s/p back surgery   Carpal tunnel syndrome on both sides    Cervical spinal stenosis    multilevel (CT 08/2022)   History of low back pain 06/24/2011   Hyperlipidemia    Hypothyroidism    LADA (latent autoimmune diabetes in adults), managed as type 1 (HCC) Dx'd 2014   Dr. Horris Lynn.    Migraines    Neuropathic pain of right lower extremity    Osteoarthritis of hands, bilateral    Osteoarthritis of wrists, bilateral    Paresthesia    2024.  Right side of face and right side of neck-->intermittent, imaging ok.   PLANTAR WART, RIGHT 10/09/2009   Snoring    Sleep study neg  OSA 04/2018   Tobacco dependence    quit 2012; restarted not long after    Past Surgical History:  Procedure Laterality Date   BACK SURGERY  02/12/2007   lower lumbar fusion- rubber disc in back   CARDIOVASCULAR STRESS TEST  03/21/2014   No ischemia/low risk study; however, LV function showed EF 49% and global LV hypokinesis--echo recommended and it showed normal LV fxn.   CARPAL TUNNEL RELEASE     bilat 2023/24   NASAL SINUS SURGERY  02/11/2005   Polysomnogram  04/2018   NO OSA (Dr. Omar Bibber)   TRANSTHORACIC ECHOCARDIOGRAM  03/24/2014   Normal LV fxn, EF 50-55%, no wall motion abnormalities: (grade 1 diast dysfxn)   WRIST SURGERY     2023/24 L wrist [facility] arthroplasty    Outpatient Medications Prior to Visit  Medication Sig Dispense Refill   atorvastatin  (LIPITOR) 20 MG tablet Take 1 tablet (20 mg total) by mouth daily. 90 tablet 3   buPROPion  (WELLBUTRIN  XL) 150 MG 24 hr tablet Take 1 tablet (150  mg total) by mouth daily. 90 tablet 3   Continuous Glucose Receiver (FREESTYLE LIBRE 2 READER) DEVI USE TO CHECK GLUCOSE. 2 each 2   Continuous Glucose Sensor (FREESTYLE LIBRE 2 SENSOR) MISC APPLY ROUTE EVERY 14 DAYS. 2 each 5   fluticasone  (FLONASE ) 50 MCG/ACT nasal spray Place 2 sprays into both nostrils daily. 16 g 3   insulin  aspart (NOVOLOG  FLEXPEN) 100 UNIT/ML FlexPen INJECT 15 UNITS NINTO THE SKIN BEFORE EVERY A MEAL 15 mL 1   Insulin  Glargine w/ Trans Port (BASAGLAR  TEMPO PEN) 100 UNIT/ML SOPN INJECT 22 UNITS SUBCUTANEOUSLY ONCE DAILY 15 mL 1   ipratropium (ATROVENT ) 0.03 % nasal spray Place 2 sprays into both nostrils every 12 (twelve) hours. 30 mL 12   levothyroxine  (SYNTHROID ) 175 MCG tablet  Take 1 tablet (175 mcg total) by mouth daily. 90 tablet 1   metoprolol  succinate (TOPROL -XL) 100 MG 24 hr tablet TAKE ONE TABLET BY MOUTH EVERY MORNING AND ONE-HALF TABLET BY MOUTH EVERY EVENING 45 tablet 0   montelukast  (SINGULAIR ) 10 MG tablet Take 1 tablet (10 mg total) by mouth at bedtime. 14 tablet 0   oxyCODONE  (OXY IR/ROXICODONE ) 5 MG immediate release tablet 1-2 tabs po qid prn 90 tablet 0   sildenafil  (VIAGRA ) 100 MG tablet Take 1 tablet (100 mg total) by mouth daily as needed for erectile dysfunction. 10 tablet 11   hydrALAZINE  (APRESOLINE ) 10 MG tablet 1 tab p.o. every morning 90 tablet 1   amitriptyline  (ELAVIL ) 25 MG tablet TAKE ONE TABLET BY MOUTH EVERY MORNING AND TAKE THREE TABLETS AT BEDTIME for leg pain 360 tablet 1   citalopram  (CELEXA ) 40 MG tablet Take 1 tablet (40 mg total) by mouth daily. 90 tablet 1   Insulin  Pen Needle (SURE COMFORT PEN NEEDLES) 32G X 4 MM MISC USE FOUR TIMES DAILY. 200 each 5   losartan  (COZAAR ) 100 MG tablet Take 1 tablet (100 mg total) by mouth daily. 30 tablet 0   meloxicam  (MOBIC ) 15 MG tablet Take 1 tablet (15 mg total) by mouth daily. 30 tablet 2   pantoprazole  (PROTONIX ) 40 MG tablet Take 1 tablet (40 mg total) by mouth daily. 90 tablet 1   tamsulosin  (FLOMAX ) 0.4 MG CAPS capsule Take 1 capsule (0.4 mg total) by mouth daily after supper. 90 capsule 1   No facility-administered medications prior to visit.    Allergies  Allergen Reactions   Amlodipine  Other (See Comments)    Headache   Augmentin  [Amoxicillin -Pot Clavulanate] Nausea And Vomiting   Oxycodone -Acetaminophen      REACTION: itching   Levothyroxine  Rash    Had rash associated with generic levothyroxine , but not brand-name Synthroid .  Likely allergic to a dye or filler in the generic preparation.   Lisinopril Cough    Review of Systems As per HPI  PE:    07/08/2023    7:59 AM 05/16/2023    1:06 PM 05/02/2023    9:54 AM  Vitals with BMI  Height  5\' 5"  5\' 5"   Weight 159 lbs 6  oz 156 lbs 13 oz 153 lbs 3 oz  BMI  26.09 25.49  Systolic 131 132 244  Diastolic 76 78 82  Pulse 65 62 62     Physical Exam  Gen: Alert, well appearing.  Patient is oriented to person, place, time, and situation. AFFECT: pleasant, lucid thought and speech. Foot exam - no swelling, tenderness or skin or vascular lesions. Color and temperature is normal. Sensation is intact. Peripheral pulses are palpable. Toenails are  normal.   LABS:  Last CBC Lab Results  Component Value Date   WBC 8.6 06/17/2022   HGB 15.1 06/17/2022   HCT 44.5 06/17/2022   MCV 90.8 06/17/2022   MCH 31.5 12/01/2020   RDW 13.7 06/17/2022   PLT 293.0 06/17/2022   Last metabolic panel Lab Results  Component Value Date   GLUCOSE 159 (H) 03/21/2023   NA 131 (L) 03/21/2023   K 4.8 03/21/2023   CL 89 (L) 03/21/2023   CO2 34 (H) 03/21/2023   BUN 10 03/21/2023   CREATININE 1.13 03/21/2023   GFR 71.81 03/21/2023   CALCIUM  9.1 03/21/2023   PROT 6.7 06/17/2022   ALBUMIN 4.1 06/17/2022   BILITOT 0.8 06/17/2022   ALKPHOS 145 (H) 06/17/2022   AST 15 06/17/2022   ALT 16 06/17/2022   ANIONGAP 10 09/15/2018   Last lipids Lab Results  Component Value Date   CHOL 105 06/17/2022   HDL 57.00 06/17/2022   LDLCALC 38 06/17/2022   TRIG 48.0 06/17/2022   CHOLHDL 2 06/17/2022   Last hemoglobin A1c Lab Results  Component Value Date   HGBA1C 7.3 (A) 03/21/2023   HGBA1C 7.3 03/21/2023   HGBA1C 7.3 (A) 03/21/2023   HGBA1C 7.3 (A) 03/21/2023   Last thyroid  functions Lab Results  Component Value Date   TSH 0.43 05/02/2023   T3TOTAL 91 01/12/2021   Last vitamin D  Lab Results  Component Value Date   VD25OH 30.38 03/31/2015   Last vitamin B12 and Folate Lab Results  Component Value Date   VITAMINB12 >1500 (H) 03/31/2015   Lab Results  Component Value Date   PSA 0.29 05/03/2020   PSA 0.23 12/24/2018   PSA 0.23 11/18/2017   IMPRESSION AND PLAN:  #1 LADA POC Hba1c today is 7.2% Continue 22 units  Basaglar  every morning and try to push the dose of NovoLog  at mealtime to get postprandial glucose is around 150-160 consistently. Feet exam normal today. Urine microalbumin/creatinine today.  2.  Hypertension, poor control. Increase hydralazine  to 25 mg and take 3 times a day. Continue losartan  100 mg a day and Toprol -XL 150 mg a day. Electrolytes and creatinine today.  3.  Chronic pain syndrome.  Chronic bilateral hand and wrist pain due to severe osteoarthritis. Stable. Continue oxycodone  5 mg, 1-2 4 times daily as needed.  A new prescription was not needed today. Consult substance contract and UDS up-to-date.  #4 preventative health care: Prostate cancer screening with PSA today.  An After Visit Summary was printed and given to the patient.  FOLLOW UP: Return in about 3 months (around 10/08/2023) for routine chronic illness f/u.  Signed:  Arletha Lady, MD           07/08/2023

## 2023-07-09 ENCOUNTER — Other Ambulatory Visit: Payer: Self-pay | Admitting: Family Medicine

## 2023-07-09 ENCOUNTER — Ambulatory Visit (INDEPENDENT_AMBULATORY_CARE_PROVIDER_SITE_OTHER)

## 2023-07-09 ENCOUNTER — Ambulatory Visit: Payer: Self-pay | Admitting: Family Medicine

## 2023-07-09 DIAGNOSIS — R748 Abnormal levels of other serum enzymes: Secondary | ICD-10-CM

## 2023-07-09 LAB — GAMMA GT: GGT: 148 U/L — ABNORMAL HIGH (ref 7–51)

## 2023-07-12 ENCOUNTER — Encounter: Payer: Self-pay | Admitting: Family Medicine

## 2023-07-13 ENCOUNTER — Encounter: Payer: Self-pay | Admitting: Family Medicine

## 2023-07-14 ENCOUNTER — Ambulatory Visit (HOSPITAL_BASED_OUTPATIENT_CLINIC_OR_DEPARTMENT_OTHER)
Admission: RE | Admit: 2023-07-14 | Discharge: 2023-07-14 | Disposition: A | Discharge status: Home / Self Care | Source: Ambulatory Visit | Attending: Family Medicine | Admitting: Family Medicine

## 2023-07-14 ENCOUNTER — Other Ambulatory Visit: Payer: Self-pay | Admitting: Family Medicine

## 2023-07-14 DIAGNOSIS — R748 Abnormal levels of other serum enzymes: Secondary | ICD-10-CM | POA: Insufficient documentation

## 2023-07-14 MED ORDER — HYDROCHLOROTHIAZIDE 25 MG PO TABS: 25.0000 mg | ORAL_TABLET | Freq: Every day | ORAL | 1 refills | Status: DC

## 2023-07-14 NOTE — Telephone Encounter (Signed)
 Okay, stop hydralazine . I will send in a prescription for HCTZ 25 mg and he will take this once a day.

## 2023-07-14 NOTE — Telephone Encounter (Signed)
 Requesting: Oxycodone  Contract: N/A for this med UDS: 03/21/23 Last Visit: 07/08/23 Next Visit: 10/08/23 Last Refill: 06/13/23 (90,0)  Please Advise. Rx pending

## 2023-07-14 NOTE — Telephone Encounter (Signed)
 MyChart message read by pt.

## 2023-07-14 NOTE — Telephone Encounter (Signed)
 Audrene Blessing that one of his liver tests is significantly elevated and I recommend he get a liver ultrasound.  Pls order RUQ limited abdominal ultrasound for the imaging location of his choice, dx elevated alkaline phosphatase and elevated GGT. Also, his urine has some protein in it, not alarming but we'll need to repeat this at his next office visit and if it remains elevated we'll add an additional medication to try to help.

## 2023-07-16 ENCOUNTER — Other Ambulatory Visit: Payer: Self-pay | Admitting: Family Medicine

## 2023-07-17 ENCOUNTER — Encounter: Payer: Self-pay | Admitting: Family Medicine

## 2023-07-19 ENCOUNTER — Other Ambulatory Visit (HOSPITAL_BASED_OUTPATIENT_CLINIC_OR_DEPARTMENT_OTHER)

## 2023-07-24 ENCOUNTER — Ambulatory Visit: Payer: Self-pay | Admitting: Family Medicine

## 2023-08-11 ENCOUNTER — Other Ambulatory Visit: Payer: Self-pay | Admitting: Family Medicine

## 2023-08-11 NOTE — Telephone Encounter (Signed)
 Requesting: oxycodone  Contract: N/A UDS: 03/21/23 Last Visit: 07/08/23 Next Visit: 10/08/23 Last Refill: 07/14/23 (90,0)  Please Advise. Rx pending

## 2023-08-21 ENCOUNTER — Other Ambulatory Visit: Payer: Self-pay | Admitting: Family Medicine

## 2023-09-01 ENCOUNTER — Other Ambulatory Visit: Payer: Self-pay | Admitting: Family Medicine

## 2023-09-10 ENCOUNTER — Encounter: Payer: Self-pay | Admitting: Family Medicine

## 2023-09-10 DIAGNOSIS — E139 Other specified diabetes mellitus without complications: Secondary | ICD-10-CM

## 2023-09-10 DIAGNOSIS — E162 Hypoglycemia, unspecified: Secondary | ICD-10-CM

## 2023-09-10 NOTE — Telephone Encounter (Signed)
 Sure. Referral to Dr. Hosie ordered.

## 2023-09-11 ENCOUNTER — Other Ambulatory Visit: Payer: Self-pay | Admitting: Family Medicine

## 2023-09-11 NOTE — Telephone Encounter (Signed)
Pt advised refill sent. °

## 2023-09-11 NOTE — Telephone Encounter (Signed)
 Requesting: oxycodone  Contract: 08/02/21 UDS: 03/21/23 Last Visit: 07/08/23 Next Visit: 10/08/23 Last Refill: 08/11/23 (90,0)  Please Advise. Rx pending

## 2023-09-18 ENCOUNTER — Other Ambulatory Visit: Payer: Self-pay | Admitting: Family Medicine

## 2023-10-06 ENCOUNTER — Other Ambulatory Visit: Payer: Self-pay | Admitting: Family Medicine

## 2023-10-06 DIAGNOSIS — E139 Other specified diabetes mellitus without complications: Secondary | ICD-10-CM

## 2023-10-08 ENCOUNTER — Other Ambulatory Visit (HOSPITAL_COMMUNITY): Payer: Self-pay

## 2023-10-08 ENCOUNTER — Encounter: Payer: Self-pay | Admitting: Family Medicine

## 2023-10-08 ENCOUNTER — Ambulatory Visit: Admitting: Family Medicine

## 2023-10-08 ENCOUNTER — Telehealth: Payer: Self-pay

## 2023-10-08 VITALS — BP 137/80 | HR 92 | Temp 98.4°F | Ht 65.0 in | Wt 163.6 lb

## 2023-10-08 DIAGNOSIS — Z79899 Other long term (current) drug therapy: Secondary | ICD-10-CM | POA: Diagnosis not present

## 2023-10-08 DIAGNOSIS — G894 Chronic pain syndrome: Secondary | ICD-10-CM | POA: Diagnosis not present

## 2023-10-08 DIAGNOSIS — E7800 Pure hypercholesterolemia, unspecified: Secondary | ICD-10-CM

## 2023-10-08 DIAGNOSIS — R748 Abnormal levels of other serum enzymes: Secondary | ICD-10-CM | POA: Diagnosis not present

## 2023-10-08 DIAGNOSIS — I10 Essential (primary) hypertension: Secondary | ICD-10-CM | POA: Diagnosis not present

## 2023-10-08 DIAGNOSIS — Z87891 Personal history of nicotine dependence: Secondary | ICD-10-CM

## 2023-10-08 DIAGNOSIS — N401 Enlarged prostate with lower urinary tract symptoms: Secondary | ICD-10-CM

## 2023-10-08 DIAGNOSIS — E139 Other specified diabetes mellitus without complications: Secondary | ICD-10-CM

## 2023-10-08 DIAGNOSIS — Z122 Encounter for screening for malignant neoplasm of respiratory organs: Secondary | ICD-10-CM

## 2023-10-08 LAB — COMPREHENSIVE METABOLIC PANEL WITH GFR
ALT: 16 U/L (ref 0–53)
AST: 15 U/L (ref 0–37)
Albumin: 4.1 g/dL (ref 3.5–5.2)
Alkaline Phosphatase: 134 U/L — ABNORMAL HIGH (ref 39–117)
BUN: 13 mg/dL (ref 6–23)
CO2: 33 meq/L — ABNORMAL HIGH (ref 19–32)
Calcium: 8.8 mg/dL (ref 8.4–10.5)
Chloride: 88 meq/L — ABNORMAL LOW (ref 96–112)
Creatinine, Ser: 0.99 mg/dL (ref 0.40–1.50)
GFR: 83.83 mL/min (ref >60.00)
Glucose, Bld: 304 mg/dL — ABNORMAL HIGH (ref 70–99)
Potassium: 3.9 meq/L (ref 3.5–5.1)
Sodium: 127 meq/L — ABNORMAL LOW (ref 135–145)
Total Bilirubin: 0.6 mg/dL (ref 0.2–1.2)
Total Protein: 6.6 g/dL (ref 6.0–8.3)

## 2023-10-08 LAB — POCT GLYCOSYLATED HEMOGLOBIN (HGB A1C)
HbA1c POC (<> result, manual entry): 7.2 % (ref 4.0–5.6)
HbA1c, POC (controlled diabetic range): 7.2 % — AB (ref 0.0–7.0)
HbA1c, POC (prediabetic range): 7.2 % — AB (ref 5.7–6.4)
Hemoglobin A1C: 7.2 % — AB (ref 4.0–5.6)

## 2023-10-08 LAB — LIPASE: Lipase: 16 U/L (ref 11.0–59.0)

## 2023-10-08 MED ORDER — FREESTYLE LIBRE 3 SENSOR MISC: 3 refills | Status: AC

## 2023-10-08 MED ORDER — FREESTYLE LIBRE 3 PLUS SENSOR MISC: 3 refills | Status: AC

## 2023-10-08 MED ORDER — OXYCODONE HCL 5 MG PO TABS
ORAL_TABLET | ORAL | 0 refills | Status: DC
Start: 2023-10-08 — End: 2023-11-06

## 2023-10-08 MED ORDER — HYDROCHLOROTHIAZIDE 25 MG PO TABS: 25.0000 mg | ORAL_TABLET | Freq: Every day | ORAL | 3 refills | Status: AC

## 2023-10-08 NOTE — Telephone Encounter (Signed)
 Pharmacy Patient Advocate Encounter   Received notification from Onbase that prior authorization for oxyCODONE  HCl 5MG  tablets   is required/requested.   Insurance verification completed.   The patient is insured through [employer] .   Per test claim: PA required; PA submitted to above mentioned insurance via Latent Key/confirmation #/EOC [id number] Status is pending

## 2023-10-08 NOTE — Progress Notes (Signed)
 OFFICE VISIT  10/08/2023  CC:  Chief Complaint  Patient presents with   Medical Management of Chronic Issues    Patient is a 58 y.o. male who presents for 33-month follow-up chronic pain syndrome, hypertension, and diabetes. A/P as of last visit: #1 LADA POC Hba1c today is 7.2% Continue 22 units Basaglar  every morning and try to push the dose of NovoLog  at mealtime to get postprandial glucose is around 150-160 consistently. Feet exam normal today. Urine microalbumin/creatinine today.   2.  Hypertension, poor control. Increase hydralazine  to 25 mg and take 3 times a day. Continue losartan  100 mg a day and Toprol -XL 150 mg a day. Electrolytes and creatinine today.   3.  Chronic pain syndrome.  Chronic bilateral hand and wrist pain due to severe osteoarthritis. Stable. Continue oxycodone  5 mg, 1-2 4 times daily as needed.  A new prescription was not needed today. Consult substance contract and UDS up-to-date.   #4 preventative health care: Prostate cancer screening with PSA today.  INTERIM HX: Harden feels pretty good.  Appetite is good, he has put on 4 pounds since I last saw him. He denies any nausea, vomiting, abdominal pain, bloating, or constipation.  He has some urinary hesitancy, slightly decreased force of stream.  He has some nocturia.  This is all been better on Flomax  0.4 mg nightly.  His pain is manageable on the current regimen. Chronic bilateral hands and wrist pain and chronic neck pain stable.  He takes meloxicam  15 mg a day with food. He also takes oxycodone  5 mg, 1-2 tabs p.o. 4 times daily as needed.  PMP AWARE reviewed today: most recent rx for oxycodone  5 mg was filled 09/11/2023, # 90, rx by me. No red flags.  Fasting glucoses range 100-130.  He tends to have some problems eating in the morning and also does not take any insulin  in the morning but says his glucose by the time he eats lunch is in the 200s.  He gives anywhere from 6 to 10 units of NovoLog   at meals depending on what he is eating. He still finds that his sugar rapidly drops in the mid afternoon but no clear pattern as to why.  ROS as above, plus--> no fevers, no CP, no SOB, no wheezing, no cough, no dizziness, no HAs, no rashes, no melena/hematochezia.  No polyuria or polydipsia.  No myalgias or arthralgias.  No focal weakness, paresthesias, or tremors.  No acute vision or hearing abnormalities.  No dysuria or unusual/new urinary urgency or frequency.  No recent changes in lower legs.  No palpitations.     Past Medical History:  Diagnosis Date   Back pain    s/p back surgery   Carpal tunnel syndrome on both sides    Cervical spinal stenosis    multilevel (CT 08/2022)   Elevated alkaline phosphatase level    History of low back pain 06/24/2011   Hyperlipidemia    Hypothyroidism    LADA (latent autoimmune diabetes in adults), managed as type 1 (HCC) Dx'd 2014   Dr. Hosie.   Migraines    Neuropathic pain of right lower extremity    Osteoarthritis of hands, bilateral    Osteoarthritis of wrists, bilateral    Paresthesia    2024.  Right side of face and right side of neck-->intermittent, imaging ok.   PLANTAR WART, RIGHT 10/09/2009   Snoring    Sleep study neg  OSA 04/2018   Tobacco dependence    quit 2012; restarted not  long after    Past Surgical History:  Procedure Laterality Date   BACK SURGERY  02/12/2007   lower lumbar fusion- rubber disc in back   CARDIOVASCULAR STRESS TEST  03/21/2014   No ischemia/low risk study; however, LV function showed EF 49% and global LV hypokinesis--echo recommended and it showed normal LV fxn.   CARPAL TUNNEL RELEASE     bilat 2023/24   NASAL SINUS SURGERY  02/11/2005   Polysomnogram  04/2018   NO OSA (Dr. Buck)   TRANSTHORACIC ECHOCARDIOGRAM  03/24/2014   Normal LV fxn, EF 50-55%, no wall motion abnormalities: (grade 1 diast dysfxn)   WRIST SURGERY     2023/24 L wrist [facility] arthroplasty    Outpatient Medications Prior to  Visit  Medication Sig Dispense Refill   amitriptyline  (ELAVIL ) 25 MG tablet TAKE ONE TABLET BY MOUTH EVERY MORNING AND TAKE THREE TABLETS AT BEDTIME for leg pain 360 tablet 1   atorvastatin  (LIPITOR) 20 MG tablet Take 1 tablet (20 mg total) by mouth daily. 90 tablet 3   buPROPion  (WELLBUTRIN  XL) 150 MG 24 hr tablet Take 1 tablet (150 mg total) by mouth daily. 90 tablet 3   citalopram  (CELEXA ) 40 MG tablet Take 1 tablet (40 mg total) by mouth daily. 90 tablet 1   Continuous Glucose Receiver (FREESTYLE LIBRE 2 READER) DEVI USE TO CHECK GLUCOSE. 2 each 2   fluticasone  (FLONASE ) 50 MCG/ACT nasal spray Place 2 sprays into both nostrils daily. 16 g 3   insulin  aspart (NOVOLOG  FLEXPEN) 100 UNIT/ML FlexPen INJECT 15 UNITS INTO THE SKIN BEFORE EVERY A MEAL 15 mL 1   Insulin  Glargine w/ Trans Port (BASAGLAR  TEMPO PEN) 100 UNIT/ML SOPN INJECT 22 UNITS SUBCUTANEOUSLY ONCE DAILY 15 mL 1   Insulin  Pen Needle (SURE COMFORT PEN NEEDLES) 32G X 4 MM MISC USE FOUR TIMES DAILY. 200 each 5   ipratropium (ATROVENT ) 0.03 % nasal spray Place 2 sprays into both nostrils every 12 (twelve) hours. 30 mL 12   levothyroxine  (SYNTHROID ) 175 MCG tablet Take 1 tablet (175 mcg total) by mouth daily. 90 tablet 1   losartan  (COZAAR ) 100 MG tablet Take 1 tablet (100 mg total) by mouth daily. 90 tablet 1   meloxicam  (MOBIC ) 15 MG tablet Take 1 tablet (15 mg total) by mouth daily. 90 tablet 1   metoprolol  succinate (TOPROL -XL) 100 MG 24 hr tablet TAKE ONE TABLET BY MOUTH EVERY MORNING AND ONE-HALF TABLET BY MOUTH EVERY EVENING 45 tablet 0   pantoprazole  (PROTONIX ) 40 MG tablet Take 1 tablet (40 mg total) by mouth daily. 90 tablet 1   sildenafil  (VIAGRA ) 100 MG tablet Take 1 tablet (100 mg total) by mouth daily as needed for erectile dysfunction. 10 tablet 11   tamsulosin  (FLOMAX ) 0.4 MG CAPS capsule Take 1 capsule (0.4 mg total) by mouth daily after supper. 90 capsule 1   oxyCODONE  (OXY IR/ROXICODONE ) 5 MG immediate release tablet TAKE  ONE TO TWO TABLETS BY MOUTH FOUR TIMES DAILY AS NEEDED 90 tablet 0   Continuous Glucose Sensor (FREESTYLE LIBRE 2 SENSOR) MISC APPLY ROUTE EVERY 14 DAYS. 2 each 5   hydrochlorothiazide  (HYDRODIURIL ) 25 MG tablet Take 1 tablet (25 mg total) by mouth daily. 30 tablet 1   montelukast  (SINGULAIR ) 10 MG tablet Take 1 tablet (10 mg total) by mouth at bedtime. 14 tablet 0   No facility-administered medications prior to visit.    Allergies  Allergen Reactions   Amlodipine  Other (See Comments)    Headache  Augmentin  [Amoxicillin -Pot Clavulanate] Nausea And Vomiting   Oxycodone -Acetaminophen      REACTION: itching   Levothyroxine  Rash    Had rash associated with generic levothyroxine , but not brand-name Synthroid .  Likely allergic to a dye or filler in the generic preparation.   Lisinopril Cough    Review of Systems As per HPI  PE:    10/08/2023    8:51 AM 07/08/2023    7:59 AM 05/16/2023    1:06 PM  Vitals with BMI  Height 5' 5  5' 5  Weight 163 lbs 10 oz 159 lbs 6 oz 156 lbs 13 oz  BMI 27.22  26.09  Systolic 137 131 867  Diastolic 80 76 78  Pulse 92 65 62     Physical Exam  Gen: Alert, well appearing.  Patient is oriented to person, place, time, and situation. AFFECT: pleasant, lucid thought and speech. No further exam today  LABS:  Last CBC Lab Results  Component Value Date   WBC 8.6 06/17/2022   HGB 15.1 06/17/2022   HCT 44.5 06/17/2022   MCV 90.8 06/17/2022   MCH 31.5 12/01/2020   RDW 13.7 06/17/2022   PLT 293.0 06/17/2022   Last metabolic panel Lab Results  Component Value Date   GLUCOSE 110 (H) 07/08/2023   NA 130 (L) 07/08/2023   K 4.8 07/08/2023   CL 90 (L) 07/08/2023   CO2 31 07/08/2023   BUN 16 07/08/2023   CREATININE 0.94 07/08/2023   GFR 89.37 07/08/2023   CALCIUM  9.4 07/08/2023   PROT 7.0 07/08/2023   ALBUMIN 4.2 07/08/2023   BILITOT 0.7 07/08/2023   ALKPHOS 255 (H) 07/08/2023   AST 22 07/08/2023   ALT 30 07/08/2023   ANIONGAP 10 09/15/2018    Last lipids Lab Results  Component Value Date   CHOL 116 07/08/2023   HDL 55.20 07/08/2023   LDLCALC 45 07/08/2023   TRIG 77.0 07/08/2023   CHOLHDL 2 07/08/2023   Last hemoglobin A1c Lab Results  Component Value Date   HGBA1C 7.2 (A) 10/08/2023   HGBA1C 7.2 10/08/2023   HGBA1C 7.2 (A) 10/08/2023   HGBA1C 7.2 (A) 10/08/2023   Last thyroid  functions Lab Results  Component Value Date   TSH 0.43 05/02/2023   T3TOTAL 91 01/12/2021   THYROIDAB 1680.0 (H) 10/09/2009   Last vitamin D  Lab Results  Component Value Date   VD25OH 30.38 03/31/2015   Last vitamin B12 and Folate Lab Results  Component Value Date   VITAMINB12 >1500 (H) 03/31/2015   Lab Results  Component Value Date   PSA 0.16 07/08/2023   PSA 0.29 05/03/2020   PSA 0.23 12/24/2018   IMPRESSION AND PLAN:  #1  LADA, fairly well-controlled. POC Hba1c today is 7.2%, same as 3 months ago. He will continue 22 units Basaglar  daily and 6 to 10 units of NovoLog  with each meal. He has an initial appointment with an endocrinologist on October 1 for further expert management, possibly consider insulin  pump. Monitoring serum creatinine today.  2.  Hypertension, well-controlled on hydralazine   25 mg 3 times a day,  losartan  100 mg a day, and Toprol -XL 150 mg a day. Electrolytes and creatinine today.   3.  Chronic pain syndrome.  Chronic bilateral hand and wrist pain due to severe osteoarthritis. Stable. Continue oxycodone  5 mg, 1-2 4 times daily as needed. Consult substance contract and UDS up-to-date.  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 Increase Flomax  to 0.8 mg nightly.  5.  Chronic elevation of alkaline  phosphatase. GGT is elevated as well.  Limited right upper quadrant ultrasound a couple of months ago showed some subtle changes consistent with fatty liver but otherwise normal. Remainder of liver panel has been normal. He does not drink alcohol, does not take Tylenol . Will refer to GI to see  if anything further needs to be done in the workup. Check liver panel and lipase today.  #6 lung cancer screening. He is a current smoker and has no cough or shortness of breath or wheezing. He has a 40-pack-year history of smoking cigarettes. Through shared decision making process today , he is in favor of getting lung cancer screening CT--> ordered today.  An After Visit Summary was printed and given to the patient.  I personally spent a total of 46 minutes in the care of the patient today including preparing to see the patient, discussing/explaining the results of his recent abdominal ultrasound, reviewing his abnormal alkaline phosphatase levels and what this could possibly mean, educating him on avoidance of things that could hurt his liver such as alcohol and Tylenol , and deciding on the need for gastroenterologist referral.  Also spent time reviewing efficacy of opioid pain management, reviewing criteria for lung cancer screening and helping him decide on whether to screen or not, and reviewing home glucoses with him trying to determine patterns that may help us  better dose his insulin .  We had a conversation about seeing the specialist for help and we are getting this set up. I placed multiple orders for blood tests and for lung cancer screening CT imaging and refilled several medications.  I documented all of this in the electronic health record.   FOLLOW UP: Return in about 3 months (around 01/08/2024) for routine chronic illness f/u.  Signed:  Gerlene Hockey, MD           10/08/2023

## 2023-10-09 ENCOUNTER — Encounter: Payer: Self-pay | Admitting: Family Medicine

## 2023-10-09 MED ORDER — TAMSULOSIN HCL 0.4 MG PO CAPS: 0.8000 mg | ORAL_CAPSULE | Freq: Every day | ORAL | 0 refills | Status: DC

## 2023-10-09 NOTE — Telephone Encounter (Signed)
 Patient was advised Prior Auth approved

## 2023-10-09 NOTE — Telephone Encounter (Signed)
 Pharmacy Patient Advocate Encounter  Received notification from [employer] that Prior Authorization for OXYCODONE  5MG  TABS has been APPROVED from 10/09/23 to 04/06/24   PA #/Case ID/Reference #: [id number] '

## 2023-10-10 ENCOUNTER — Telehealth: Payer: Self-pay

## 2023-10-10 NOTE — Telephone Encounter (Signed)
 Received mychart message from pt that PA was needed for [employer]    Pharmacy provided cover my meds key for [place] 3 plus: [id number] and Libre 3:[id number]  Please assist with PA

## 2023-10-14 ENCOUNTER — Other Ambulatory Visit (HOSPITAL_COMMUNITY): Payer: Self-pay

## 2023-10-14 ENCOUNTER — Telehealth: Payer: Self-pay

## 2023-10-14 NOTE — Telephone Encounter (Addendum)
 A user error has taken place: orders placed in error, not carried out on this patient.

## 2023-10-14 NOTE — Telephone Encounter (Signed)
 Pharmacy Patient Advocate Encounter   Received notification from Pt Calls Messages that prior authorization for FreeStyle Libre 3 Plus Sensor is required/requested.   Insurance verification completed.   The patient is insured through [employer] .   Per test claim: PA required; PA submitted to above mentioned insurance via Latent Key/confirmation #/EOC [id number] Status is pending

## 2023-10-15 ENCOUNTER — Other Ambulatory Visit (HOSPITAL_COMMUNITY): Payer: Self-pay

## 2023-10-15 NOTE — Telephone Encounter (Signed)
 Pharmacy Patient Advocate Encounter  Received notification from [employer] that Prior Authorization for FreeStyle Libre 3 Plus Sensor /FreeStyle Libre 3 Sensor  has been APPROVED from 10/14/2023 to 04/11/2024    PA #/Case ID/Reference #: [id number]

## 2023-10-16 NOTE — Telephone Encounter (Signed)
 Pt advised.

## 2023-10-17 ENCOUNTER — Other Ambulatory Visit: Payer: Self-pay | Admitting: Family Medicine

## 2023-10-22 ENCOUNTER — Ambulatory Visit (HOSPITAL_BASED_OUTPATIENT_CLINIC_OR_DEPARTMENT_OTHER)
Admission: RE | Admit: 2023-10-22 | Discharge: 2023-10-22 | Disposition: A | Discharge status: Home / Self Care | Source: Ambulatory Visit | Attending: Family Medicine | Admitting: Family Medicine

## 2023-10-22 DIAGNOSIS — Z122 Encounter for screening for malignant neoplasm of respiratory organs: Secondary | ICD-10-CM | POA: Insufficient documentation

## 2023-10-22 DIAGNOSIS — Z87891 Personal history of nicotine dependence: Secondary | ICD-10-CM | POA: Insufficient documentation

## 2023-10-24 ENCOUNTER — Other Ambulatory Visit: Payer: Self-pay | Admitting: Family Medicine

## 2023-11-02 ENCOUNTER — Encounter: Payer: Self-pay | Admitting: Family Medicine

## 2023-11-04 ENCOUNTER — Encounter: Payer: Self-pay | Admitting: Family Medicine

## 2023-11-04 ENCOUNTER — Other Ambulatory Visit: Payer: Self-pay | Admitting: Family Medicine

## 2023-11-04 ENCOUNTER — Ambulatory Visit: Payer: Self-pay | Admitting: Family Medicine

## 2023-11-05 ENCOUNTER — Other Ambulatory Visit (HOSPITAL_COMMUNITY): Payer: Self-pay

## 2023-11-05 ENCOUNTER — Telehealth: Payer: Self-pay

## 2023-11-05 NOTE — Telephone Encounter (Signed)
 Pharmacy Patient Advocate Encounter   Received notification from Onbase that prior authorization for Basaglar  Tempo Pen 100UNIT/ML pen-injectors  is required/requested.   Insurance verification completed.   The patient is insured through [employer] .   Per test claim: PA required; PA submitted to above mentioned insurance via Latent Key/confirmation #/EOC [facility] Status is pending

## 2023-11-06 ENCOUNTER — Other Ambulatory Visit: Payer: Self-pay | Admitting: Family Medicine

## 2023-11-06 ENCOUNTER — Other Ambulatory Visit (HOSPITAL_COMMUNITY): Payer: Self-pay

## 2023-11-06 NOTE — Telephone Encounter (Signed)
 Requesting: oxycodone  Contract: N/A for this med UDS: 03/21/23 Last Visit: 10/08/23 Next Visit: 01/07/24 Last Refill: 10/08/23 (90,0)  Please Advise. Rx pending

## 2023-11-06 NOTE — Telephone Encounter (Signed)
 Pharmacy Patient Advocate Encounter  Received notification from [employer] that Prior Authorization for Basaglar  Tempo Pen 100UNIT/ML pen-injectors  has been APPROVED from 11/05/23 to 11/04/26. Ran test claim, Copay is $4. This test claim was processed through [facility]- copay amounts may vary at other pharmacies due to pharmacy/plan contracts, or as the patient moves through the different stages of their insurance plan.   PA #/Case ID/Reference #: [id number]

## 2023-11-12 LAB — HEMOGLOBIN A1C: Hemoglobin A1C: 7.2

## 2023-11-19 ENCOUNTER — Other Ambulatory Visit: Payer: Self-pay | Admitting: Family Medicine

## 2023-11-19 DIAGNOSIS — N138 Other obstructive and reflux uropathy: Secondary | ICD-10-CM

## 2023-12-05 ENCOUNTER — Other Ambulatory Visit: Payer: Self-pay | Admitting: Family Medicine

## 2023-12-05 NOTE — Telephone Encounter (Signed)
 Refill requested for Oxycodone  sent to [facility] pharmacy. Last OV 8/27 Next Ov 11/26

## 2024-01-05 ENCOUNTER — Other Ambulatory Visit: Payer: Self-pay | Admitting: Family Medicine

## 2024-01-05 NOTE — Telephone Encounter (Signed)
 Requesting: oxycodone  Contract: N/A for this med UDS: 03/21/23 Last Visit: 10/08/23 Next Visit: 01/07/24 Last Refill: 12/05/23 (90,0)  Please Advise. Rx pending

## 2024-01-06 NOTE — Patient Instructions (Signed)

## 2024-01-07 ENCOUNTER — Ambulatory Visit: Admitting: Family Medicine

## 2024-01-07 ENCOUNTER — Encounter: Payer: Self-pay | Admitting: Family Medicine

## 2024-01-07 VITALS — BP 125/76 | HR 69 | Temp 98.1°F | Ht 65.0 in | Wt 159.4 lb

## 2024-01-07 DIAGNOSIS — I2510 Atherosclerotic heart disease of native coronary artery without angina pectoris: Secondary | ICD-10-CM

## 2024-01-07 DIAGNOSIS — M79641 Pain in right hand: Secondary | ICD-10-CM

## 2024-01-07 DIAGNOSIS — N401 Enlarged prostate with lower urinary tract symptoms: Secondary | ICD-10-CM | POA: Diagnosis not present

## 2024-01-07 DIAGNOSIS — M19042 Primary osteoarthritis, left hand: Secondary | ICD-10-CM

## 2024-01-07 DIAGNOSIS — R748 Abnormal levels of other serum enzymes: Secondary | ICD-10-CM

## 2024-01-07 DIAGNOSIS — G894 Chronic pain syndrome: Secondary | ICD-10-CM

## 2024-01-07 DIAGNOSIS — M19032 Primary osteoarthritis, left wrist: Secondary | ICD-10-CM

## 2024-01-07 DIAGNOSIS — N138 Other obstructive and reflux uropathy: Secondary | ICD-10-CM

## 2024-01-07 DIAGNOSIS — K3189 Other diseases of stomach and duodenum: Secondary | ICD-10-CM | POA: Diagnosis not present

## 2024-01-07 DIAGNOSIS — M19031 Primary osteoarthritis, right wrist: Secondary | ICD-10-CM

## 2024-01-07 DIAGNOSIS — M19041 Primary osteoarthritis, right hand: Secondary | ICD-10-CM

## 2024-01-07 DIAGNOSIS — M79642 Pain in left hand: Secondary | ICD-10-CM

## 2024-01-07 LAB — COMPREHENSIVE METABOLIC PANEL WITH GFR
ALT: 13 U/L (ref 0–53)
AST: 13 U/L (ref 0–37)
Albumin: 4.3 g/dL (ref 3.5–5.2)
Alkaline Phosphatase: 123 U/L — ABNORMAL HIGH (ref 39–117)
BUN: 16 mg/dL (ref 6–23)
CO2: 33 meq/L — ABNORMAL HIGH (ref 19–32)
Calcium: 9.3 mg/dL (ref 8.4–10.5)
Chloride: 85 meq/L — ABNORMAL LOW (ref 96–112)
Creatinine, Ser: 1.13 mg/dL (ref 0.40–1.50)
GFR: 71.4 mL/min (ref >60.00)
Glucose, Bld: 165 mg/dL — ABNORMAL HIGH (ref 70–99)
Potassium: 4 meq/L (ref 3.5–5.1)
Sodium: 124 meq/L — ABNORMAL LOW (ref 135–145)
Total Bilirubin: 0.8 mg/dL (ref 0.2–1.2)
Total Protein: 6.9 g/dL (ref 6.0–8.3)

## 2024-01-07 LAB — GAMMA GT: GGT: 14 U/L (ref 7–51)

## 2024-01-07 LAB — LIPASE: Lipase: 5 U/L — ABNORMAL LOW (ref 11.0–59.0)

## 2024-01-07 MED ORDER — LOSARTAN POTASSIUM 100 MG PO TABS: 100.0000 mg | ORAL_TABLET | Freq: Every day | ORAL | 3 refills | Status: AC

## 2024-01-07 MED ORDER — TAMSULOSIN HCL 0.4 MG PO CAPS
0.8000 mg | ORAL_CAPSULE | Freq: Every day | ORAL | 3 refills | Status: AC
Start: 2024-01-07 — End: ?

## 2024-01-07 MED ORDER — CITALOPRAM HYDROBROMIDE 40 MG PO TABS: 40.0000 mg | ORAL_TABLET | Freq: Every day | ORAL | 3 refills | Status: AC

## 2024-01-07 MED ORDER — ATORVASTATIN CALCIUM 20 MG PO TABS: 20.0000 mg | ORAL_TABLET | Freq: Every day | ORAL | 3 refills | Status: AC

## 2024-01-07 MED ORDER — LEVOTHYROXINE SODIUM 175 MCG PO TABS: 175.0000 ug | ORAL_TABLET | Freq: Every day | ORAL | 3 refills | Status: AC

## 2024-01-07 MED ORDER — AMITRIPTYLINE HCL 25 MG PO TABS: ORAL_TABLET | ORAL | 3 refills | Status: AC

## 2024-01-07 MED ORDER — BUPROPION HCL ER (XL) 150 MG PO TB24: 150.0000 mg | ORAL_TABLET | Freq: Every day | ORAL | 3 refills | Status: AC

## 2024-01-07 NOTE — Progress Notes (Signed)
 OFFICE VISIT  01/07/2024  CC:  Chief Complaint  Patient presents with   Medical Management of Chronic Issues    Pt is fasting    Patient is a 58 y.o. male who presents for 27-month follow-up chronic pain syndrome, hypertension, elevated alkaline phosphatase. A/P as of last visit: 1  LADA, fairly well-controlled. POC Hba1c today is 7.2%, same as 3 months ago. He will continue 22 units Basaglar  daily and 6 to 10 units of NovoLog  with each meal. He has an initial appointment with an endocrinologist on October 1 for further expert management, possibly consider insulin  pump. Monitoring serum creatinine today.   2.  Hypertension, well-controlled on hydralazine   25 mg 3 times a day,  losartan  100 mg a day, and Toprol -XL 150 mg a day. Electrolytes and creatinine today.   3.  Chronic pain syndrome.  Chronic bilateral hand and wrist pain due to severe osteoarthritis. Stable. Continue oxycodone  5 mg, 1-2 4 times daily as needed. Consult substance contract and UDS up-to-date.   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 Increase Flomax  to 0.8 mg nightly.   5.  Chronic elevation of alkaline phosphatase. GGT is elevated as well.  Limited right upper quadrant ultrasound a couple of months ago showed some subtle changes consistent with fatty liver but otherwise normal. Remainder of liver panel has been normal. He does not drink alcohol, does not take Tylenol . Will refer to GI to see if anything further needs to be done in the workup. Check liver panel and lipase today.   #6 lung cancer screening. He is a current smoker and has no cough or shortness of breath or wheezing. He has a 40-pack-year history of smoking cigarettes. Through shared decision making process today , he is in favor of getting lung cancer screening CT--> ordered today.  INTERIM HX: His chest CT did not show any nodules but he did have coronary calcifications and I started him on aspirin .  He saw endocrinology  on 11/12/2023, [facility] in [place]. He was continued on Basaglar  and NovoLog , hemoglobin A1c was 7.2%.  He has follow-up set up with them to discuss possible transition to insulin  pump.  Pain control is good in bilateral hands and wrists and neck. He takes meloxicam  15 mg daily and oxycodone  5 mg, 1-2 4 times per day.  His appetite is good. He notes improvement in his lower urinary tract obstructive symptoms on 0 point milligram Flomax  nightly.  PMP AWARE reviewed today: most recent rx for oxycodone  5mg  was filled 01/05/24, # 90, rx by me. No red flags.  Review of system: No abdominal pain, no nausea, no vomiting, no constipation, no diarrhea, no abnormal weight loss, no night sweats or fevers. No dysphagia.  Past Medical History:  Diagnosis Date   Aortic atherosclerosis    CAD (coronary artery disease)    noted on CT lung ca screen 10/2023   Carpal tunnel syndrome on both sides    Cervical spinal stenosis    multilevel (CT 08/2022)   Elevated alkaline phosphatase level    Gastric wall thickening    noted on CT lung ca screen 10/2023   History of low back pain 06/24/2011   Hyperlipidemia    Hypothyroidism    LADA (latent autoimmune diabetes in adults), managed as type 1 (HCC) Dx'd 2014   Dr. Hosie.   Migraines    Neuropathic pain of right lower extremity    Osteoarthritis of hands, bilateral    Osteoarthritis of wrists, bilateral  Paresthesia    2024.  Right side of face and right side of neck-->intermittent, imaging ok.   PLANTAR WART, RIGHT 10/09/2009   Snoring    Sleep study neg  OSA 04/2018   Tobacco dependence    quit 2012; restarted not long after    Past Surgical History:  Procedure Laterality Date   BACK SURGERY  02/12/2007   lower lumbar fusion- rubber disc in back   CARDIOVASCULAR STRESS TEST  03/21/2014   No ischemia/low risk study; however, LV function showed EF 49% and global LV hypokinesis--echo recommended and it showed normal  LV fxn.   CARPAL TUNNEL RELEASE     bilat 2023/24   LUNG CA SCREENING CT     10/2023 NEG. Rpt 1 yr   NASAL SINUS SURGERY  02/11/2005   Polysomnogram  04/2018   NO OSA (Dr. Buck)   TRANSTHORACIC ECHOCARDIOGRAM  03/24/2014   Normal LV fxn, EF 50-55%, no wall motion abnormalities: (grade 1 diast dysfxn)   WRIST SURGERY     2023/24 L wrist [facility] arthroplasty    Outpatient Medications Prior to Visit  Medication Sig Dispense Refill   ACCU-CHEK GUIDE TEST test strip Use to monitor blood glucose 4 time(s) daily     Accu-Chek Softclix Lancets lancets Use to monitor blood glucose 4 time(s) daily.     BASAGLAR  TEMPO PEN 100 UNIT/ML SOPN INJECT 22 UNITS SUBCUTANEOUSLY ONCE DAILY 15 mL 1   Continuous Glucose Receiver (FREESTYLE LIBRE 2 READER) DEVI USE TO CHECK GLUCOSE. 2 each 2   Continuous Glucose Sensor (FREESTYLE LIBRE 3 PLUS SENSOR) MISC Change sensor every 15 days. 2 each 3   Continuous Glucose Sensor (FREESTYLE LIBRE 3 SENSOR) MISC USE TO CHECK GLUCOSE 2 each 3   fluticasone  (FLONASE ) 50 MCG/ACT nasal spray Place 2 sprays into both nostrils daily. 16 g 3   hydrochlorothiazide  (HYDRODIURIL ) 25 MG tablet Take 1 tablet (25 mg total) by mouth daily. 90 tablet 3   insulin  aspart (NOVOLOG  FLEXPEN) 100 UNIT/ML FlexPen INJECT 15 UNITS INTO THE SKIN BEFORE EVERY A MEAL 15 mL 1   Insulin  Pen Needle (SURE COMFORT PEN NEEDLES) 32G X 4 MM MISC USE FOUR TIMES DAILY. 200 each 5   ipratropium (ATROVENT ) 0.03 % nasal spray Place 2 sprays into both nostrils every 12 (twelve) hours. 30 mL 12   meloxicam  (MOBIC ) 15 MG tablet Take 1 tablet (15 mg total) by mouth daily. 90 tablet 1   metoprolol  succinate (TOPROL -XL) 100 MG 24 hr tablet TAKE ONE TABLET BY MOUTH EVERY MORNING AND ONE-HALF TABLET BY MOUTH EVERY EVENING 135 tablet 1   oxyCODONE  (OXY IR/ROXICODONE ) 5 MG immediate release tablet TAKE ONE TO TWO TABLETS BY MOUTH FOUR TIMES DAILY AS NEEDED 90 tablet 0   pantoprazole  (PROTONIX ) 40 MG tablet Take 1 tablet (40  mg total) by mouth daily. 90 tablet 1   sildenafil  (VIAGRA ) 100 MG tablet Take 1 tablet (100 mg total) by mouth daily as needed for erectile dysfunction. 10 tablet 11   amitriptyline  (ELAVIL ) 25 MG tablet TAKE ONE TABLET BY MOUTH EVERY MORNING AND TAKE THREE TABLETS AT BEDTIME for leg pain 90 tablet 0   atorvastatin  (LIPITOR) 20 MG tablet Take 1 tablet (20 mg total) by mouth daily. 90 tablet 0   buPROPion  (WELLBUTRIN  XL) 150 MG 24 hr tablet Take 1 tablet (150 mg total) by mouth daily. 90 tablet 0   citalopram  (CELEXA ) 40 MG tablet Take 1 tablet (40 mg total) by mouth daily. 30 tablet 0  levothyroxine  (SYNTHROID ) 175 MCG tablet Take 1 tablet (175 mcg total) by mouth daily. 90 tablet 1   losartan  (COZAAR ) 100 MG tablet Take 1 tablet (100 mg total) by mouth daily. 90 tablet 1   tamsulosin  (FLOMAX ) 0.4 MG CAPS capsule Take 2 capsules (0.8 mg total) by mouth daily after supper. 60 capsule 0   No facility-administered medications prior to visit.    Allergies  Allergen Reactions   Amlodipine  Other (See Comments)    Headache   Augmentin  [Amoxicillin -Pot Clavulanate] Nausea And Vomiting   Oxycodone -Acetaminophen      REACTION: itching   Levothyroxine  Rash    Had rash associated with generic levothyroxine , but not brand-name Synthroid .  Likely allergic to a dye or filler in the generic preparation.   Lisinopril Cough    Review of Systems As per HPI  PE:    01/07/2024    8:12 AM 10/08/2023    8:51 AM 07/08/2023    7:59 AM  Vitals with BMI  Height 5' 5 5' 5   Weight 159 lbs 6 oz 163 lbs 10 oz 159 lbs 6 oz  BMI 26.53 27.22   Systolic 125 137 868  Diastolic 76 80 76  Pulse 69 92 65     Physical Exam  Gen: Alert, well appearing.  Patient is oriented to person, place, time, and situation. No icterus or jaundice. Abdomen soft and nontender.  Bowel sounds normal.  No HSM or mass.  No bruit.   Extremities: No edema  LABS:  Last CBC Lab Results  Component Value Date   WBC 8.6  06/17/2022   HGB 15.1 06/17/2022   HCT 44.5 06/17/2022   MCV 90.8 06/17/2022   MCH 31.5 12/01/2020   RDW 13.7 06/17/2022   PLT 293.0 06/17/2022   Lab Results  Component Value Date   LIPASE 16.0 10/08/2023   Last metabolic panel Lab Results  Component Value Date   GLUCOSE 304 (H) 10/08/2023   NA 127 (L) 10/08/2023   K 3.9 10/08/2023   CL 88 (L) 10/08/2023   CO2 33 (H) 10/08/2023   BUN 13 10/08/2023   CREATININE 0.99 10/08/2023   GFR 83.83 10/08/2023   CALCIUM  8.8 10/08/2023   PROT 6.6 10/08/2023   ALBUMIN 4.1 10/08/2023   BILITOT 0.6 10/08/2023   ALKPHOS 134 (H) 10/08/2023   AST 15 10/08/2023   ALT 16 10/08/2023   ANIONGAP 10 09/15/2018   Last lipids Lab Results  Component Value Date   CHOL 116 07/08/2023   HDL 55.20 07/08/2023   LDLCALC 45 07/08/2023   TRIG 77.0 07/08/2023   CHOLHDL 2 07/08/2023   Last hemoglobin A1c Lab Results  Component Value Date   HGBA1C 7.2 11/12/2023   Last thyroid  functions Lab Results  Component Value Date   TSH 0.43 05/02/2023   T3TOTAL 91 01/12/2021   FREET4 1.23 01/12/2021   THYROIDAB 1680.0 (H) 10/09/2009   Last vitamin D  Lab Results  Component Value Date   VD25OH 30.38 03/31/2015   Last vitamin B12 and Folate Lab Results  Component Value Date   VITAMINB12 >1500 (H) 03/31/2015   IMPRESSION AND PLAN:  #1 Hypertension, well-controlled on hydralazine   25 mg 3 times a day,  losartan  100 mg a day, and Toprol -XL 150 mg a day. Electrolytes and creatinine today.   2.  Chronic pain syndrome.  Chronic bilateral hand and wrist pain due to severe osteoarthritis. Stable. Continue oxycodone  5 mg, 1-2 4 times daily as needed. Consult substance contract and UDS up-to-date. A  new prescription was not needed today.   #3 BPH with lower urinary tract obstructive symptoms. Significant improvement on Flomax  0.8 mg nightly.   4.  Chronic elevation of alkaline phosphatase. GGT is elevated as well.  Limited right upper quadrant  ultrasound a couple of months ago showed some subtle changes consistent with fatty liver but otherwise normal. Remainder of liver panel has been normal. He does not drink alcohol, does not take Tylenol . Lung cancer screening CT 10/22/2023 showed gastric wall thickening. Will refer to GI today.   Check liver panel and lipase today.  #5 coronary artery disease, noted on lung cancer screening CT. He is taking aspirin  81 mg a day now. Discussed importance of good control of diabetes, hypertension, and cholesterol.  Also stressed the importance of stopping smoking (he still smokes 1 pack/day).  6.  Diabetes. Control is pretty good, managed per endocrinology now. They are considering switching over to insulin  pump.  An After Visit Summary was printed and given to the patient.  FOLLOW UP: Return in about 3 months (around 04/08/2024) for routine chronic illness f/u.  Signed:  Gerlene Hockey, MD           01/07/2024

## 2024-01-09 ENCOUNTER — Other Ambulatory Visit: Payer: Self-pay | Admitting: Family Medicine

## 2024-01-09 LAB — PTH, INTACT AND CALCIUM
Calcium: 9.1 mg/dL (ref 8.6–10.3)
PTH: 18 pg/mL (ref 16–77)

## 2024-01-10 ENCOUNTER — Encounter: Payer: Self-pay | Admitting: Family Medicine

## 2024-01-10 ENCOUNTER — Ambulatory Visit: Payer: Self-pay | Admitting: Family Medicine

## 2024-01-13 ENCOUNTER — Other Ambulatory Visit: Payer: Self-pay | Admitting: Family Medicine

## 2024-01-15 DIAGNOSIS — Z1211 Encounter for screening for malignant neoplasm of colon: Secondary | ICD-10-CM | POA: Diagnosis not present

## 2024-01-15 DIAGNOSIS — K219 Gastro-esophageal reflux disease without esophagitis: Secondary | ICD-10-CM | POA: Diagnosis not present

## 2024-01-15 DIAGNOSIS — R933 Abnormal findings on diagnostic imaging of other parts of digestive tract: Secondary | ICD-10-CM | POA: Diagnosis not present

## 2024-01-15 DIAGNOSIS — R748 Abnormal levels of other serum enzymes: Secondary | ICD-10-CM | POA: Diagnosis not present

## 2024-02-01 LAB — COLOGUARD: COLOGUARD: POSITIVE — AB

## 2024-02-02 ENCOUNTER — Other Ambulatory Visit: Payer: Self-pay | Admitting: Family Medicine

## 2024-02-02 ENCOUNTER — Other Ambulatory Visit: Payer: Self-pay | Admitting: Urgent Care

## 2024-02-02 DIAGNOSIS — J0101 Acute recurrent maxillary sinusitis: Secondary | ICD-10-CM

## 2024-02-02 DIAGNOSIS — R0981 Nasal congestion: Secondary | ICD-10-CM

## 2024-02-03 ENCOUNTER — Other Ambulatory Visit: Payer: Self-pay

## 2024-02-03 ENCOUNTER — Encounter: Payer: Self-pay | Admitting: Family Medicine

## 2024-02-03 ENCOUNTER — Other Ambulatory Visit: Payer: Self-pay | Admitting: Family Medicine

## 2024-02-03 ENCOUNTER — Telehealth: Payer: Self-pay

## 2024-02-03 MED ORDER — NOVOLOG FLEXPEN 100 UNIT/ML ~~LOC~~ SOPN: PEN_INJECTOR | SUBCUTANEOUS | 1 refills | Status: DC

## 2024-02-03 NOTE — Telephone Encounter (Signed)
 Requesting: oxycodone  Contract: N/A for this med UDS: 03/21/23 Last Visit: 10/08/23 Next Visit: 01/07/24 Last Refill: 01/05/24 (90,0)   Please Advise. Rx pending

## 2024-02-03 NOTE — Telephone Encounter (Signed)
 Pt completed cologuard screening as of 12/14, positive results. His last GI visit was 12/4   Please advise if pt made aware of results and further recommendations.

## 2024-02-03 NOTE — Telephone Encounter (Signed)
 Source  April (Other)   Subject  Kosek, Daniel Stevenson (Patient)   Topic  Clinical - Prescription Issue    Communication  Reason for CRM: max units per day is needed in order for insulin  aspart (NOVOLOG  FLEXPEN) 100 UNIT/ML FlexPen Rx to be filled due to insurance. Please reach out to pharmacy ASAP.    Please confirm max dose, last RF completed by PCP. Pt has endo (Dr.Lambeth)

## 2024-02-03 NOTE — Telephone Encounter (Signed)
 He established care for his diabetes with Dr. Hosie in October this year. His insulin  RF requests need to be sent to Dr. Hosie.

## 2024-02-04 ENCOUNTER — Other Ambulatory Visit (HOSPITAL_COMMUNITY): Payer: Self-pay

## 2024-02-04 ENCOUNTER — Telehealth: Payer: Self-pay

## 2024-02-04 NOTE — Telephone Encounter (Signed)
 Pharmacy Patient Advocate Encounter   Received notification from Onbase that prior authorization for NovoLOG  FlexPen 100UNIT/ML pen-injectors  is required/requested.   Insurance verification completed.   The patient is insured through [employer].   Per test claim: PA required; PA submitted to above mentioned insurance via Latent Key/confirmation #/EOC [id number] Status is pending

## 2024-02-06 ENCOUNTER — Other Ambulatory Visit (HOSPITAL_COMMUNITY): Payer: Self-pay

## 2024-02-06 NOTE — Telephone Encounter (Signed)
 Please Advise

## 2024-02-06 NOTE — Telephone Encounter (Signed)
 He gets his insulin  prescribed by his endocrinologist, Dr. Hosie. Pls forward this request to their office.thx

## 2024-02-06 NOTE — Telephone Encounter (Signed)
 Pharmacy Patient Advocate Encounter  Received notification from [employer] that Prior Authorization for NovoLOG  FlexPen 100UNIT/ML pen-injectors   has been DENIED.  See denial reason below. No denial letter attached in CMM. Will attach denial letter to Media tab once received.   PA #/Case ID/Reference #: [id number]     Per test claim: insulin  aspart U-100 (generic Novolog ), insulin  lispro U-100 (generic Humalog ), or Relion Novolog  U-100. is preferred by the insurance.  If suggested medication is appropriate, Please send in a new RX and discontinue this one. If not, please advise as to why it's not appropriate so that we may request a Prior Authorization. Please note, some preferred medications may still require a PA.  If the suggested medications have not been trialed and there are no contraindications to their use, the PA will not be submitted, as it will not be approved.

## 2024-02-06 NOTE — Telephone Encounter (Signed)
 Spoke with pt, advised him to contact Dr.Lambeth's office for the refill. He stated he has enough for next week and possibly until his follow up on January 6.

## 2024-02-11 ENCOUNTER — Other Ambulatory Visit: Payer: Self-pay | Admitting: Family Medicine

## 2024-02-11 NOTE — Telephone Encounter (Unsigned)
 Copied from CRM #[id number]. Topic: Clinical - Medication Refill >> Feb 11, 2024 12:49 PM Jayma L wrote: Medication: insulin  aspart (NOVOLOG  FLEXPEN) 100 UNIT/ML FlexPen  Has the patient contacted their pharmacy? Yes (Agent: If no, request that the patient contact the pharmacy for the refill. If patient does not wish to contact the pharmacy document the reason why and proceed with request.) (Agent: If yes, when and what did the pharmacy advise?)  This is the patient's preferred pharmacy:  [facility] [place], [place] - [address] [place] [address] Phone: [phone] Fax: [phone]  Is this the correct pharmacy for this prescription? Yes If no, delete pharmacy and type the correct one.   Has the prescription been filled recently? No  Is the patient out of the medication? Yes  Has the patient been seen for an appointment in the last year OR does the patient have an upcoming appointment? Yes  Can we respond through MyChart? Yes  Agent: Please be advised that Rx refills may take up to 3 business days. We ask that you follow-up with your pharmacy.

## 2024-02-26 ENCOUNTER — Other Ambulatory Visit: Payer: Self-pay | Admitting: Family Medicine

## 2024-03-03 ENCOUNTER — Other Ambulatory Visit: Payer: Self-pay | Admitting: Family Medicine

## 2024-04-06 IMAGING — DX DG HAND COMPLETE 3+V*L*
3 series · 3 of 3 positions shown · non-contrast
Comparison: None Available.

CLINICAL DATA: Chronic hand and finger pain.

EXAM:
LEFT HAND - COMPLETE 3+ VIEW

[hand pa]
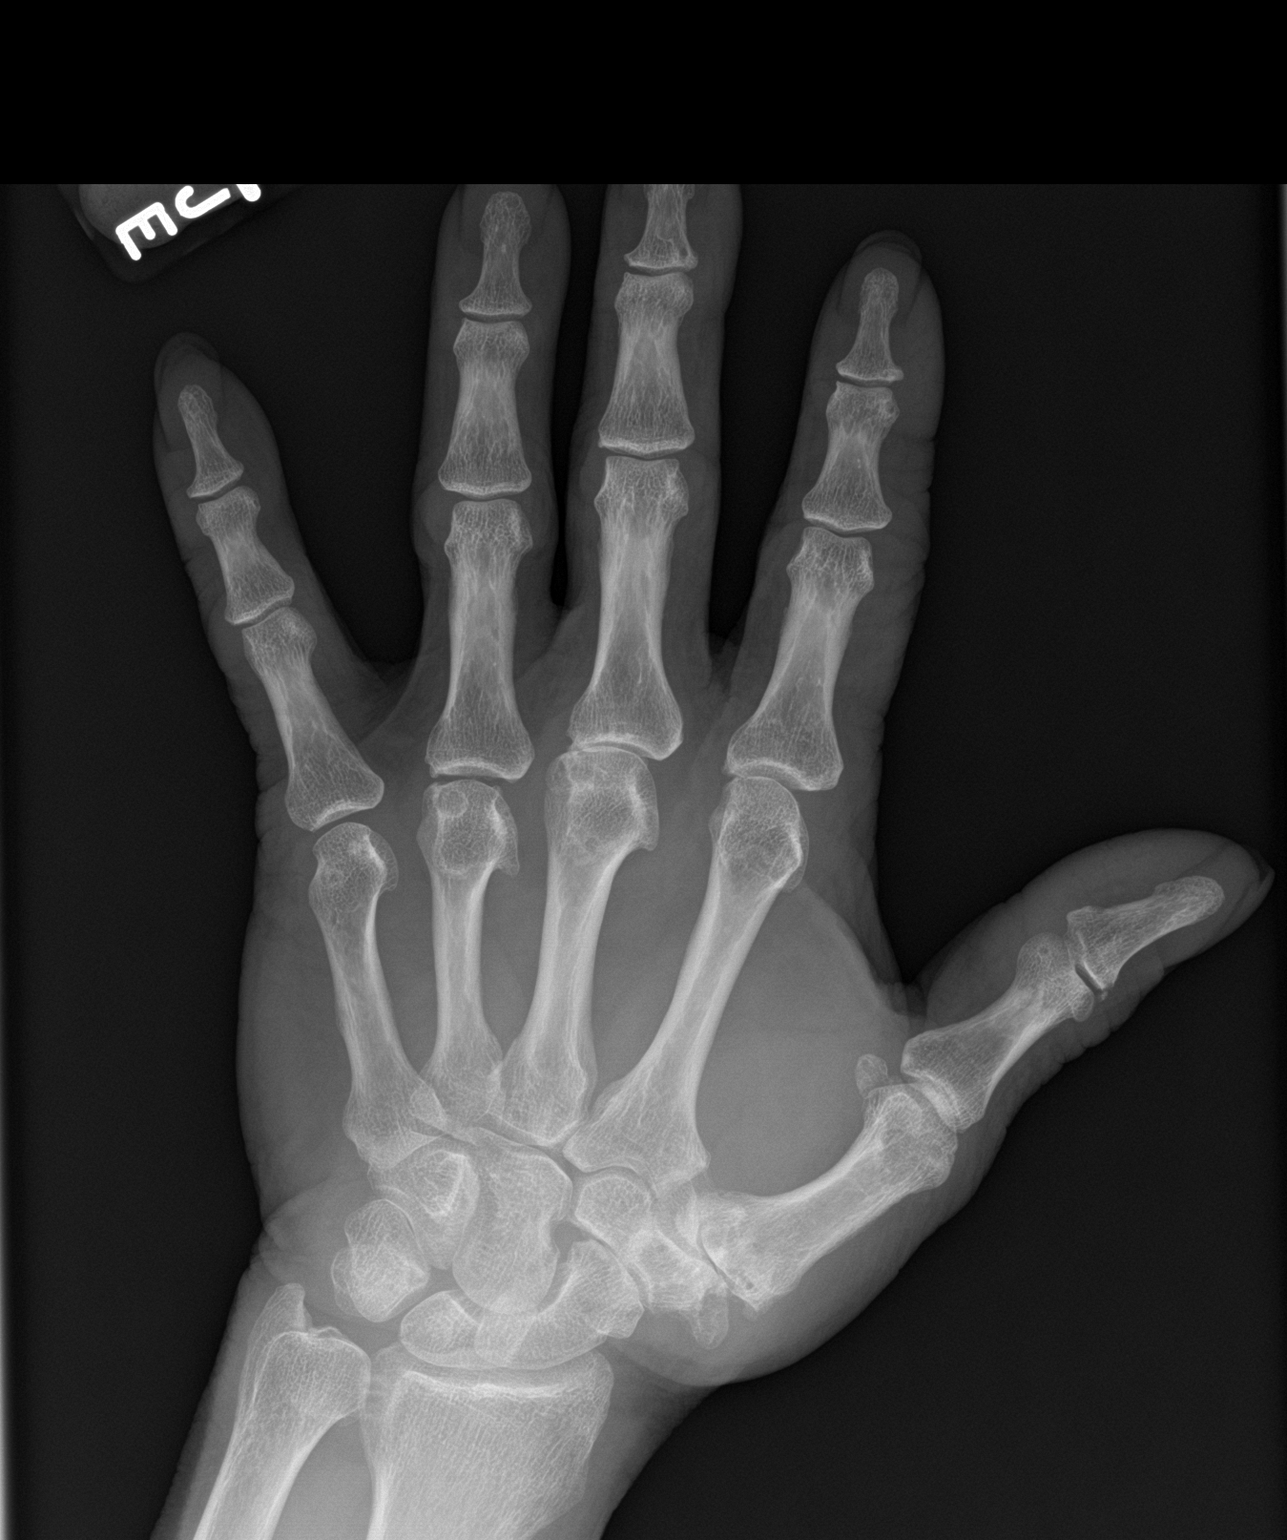

[hand obl]
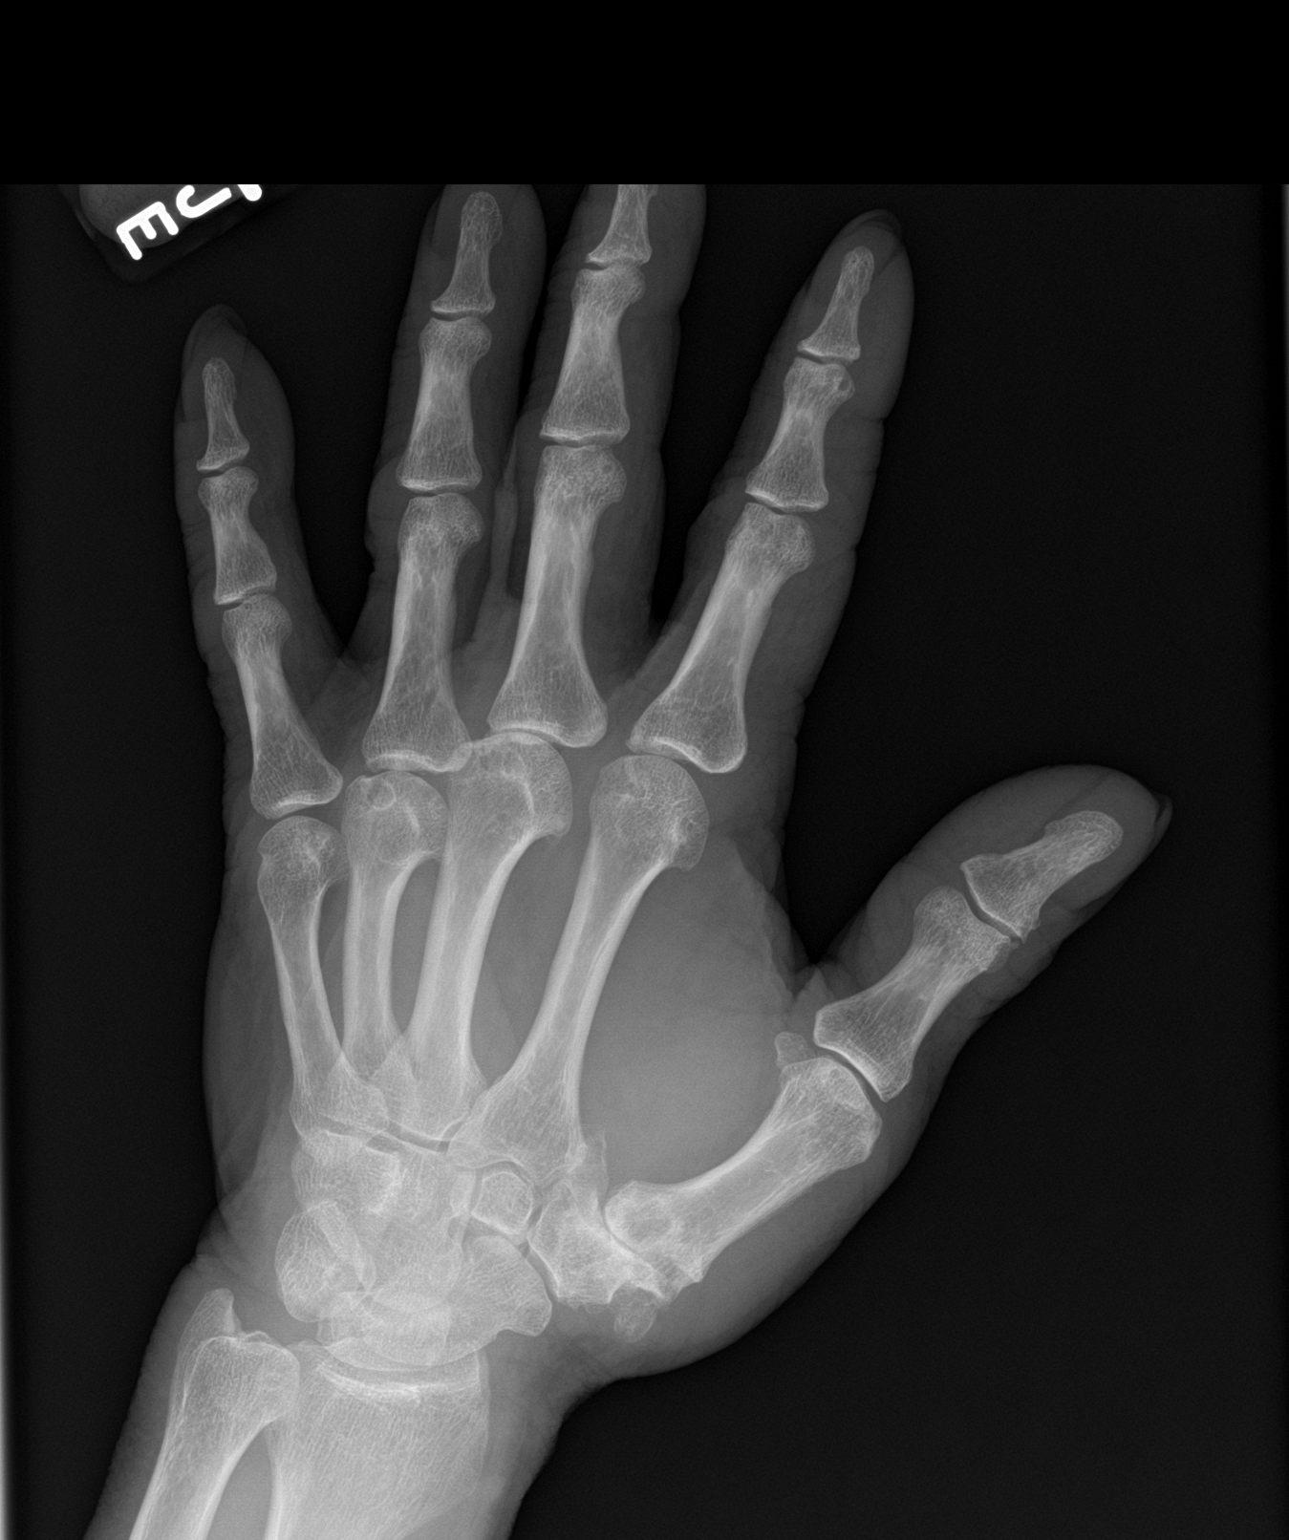

[hand lat]
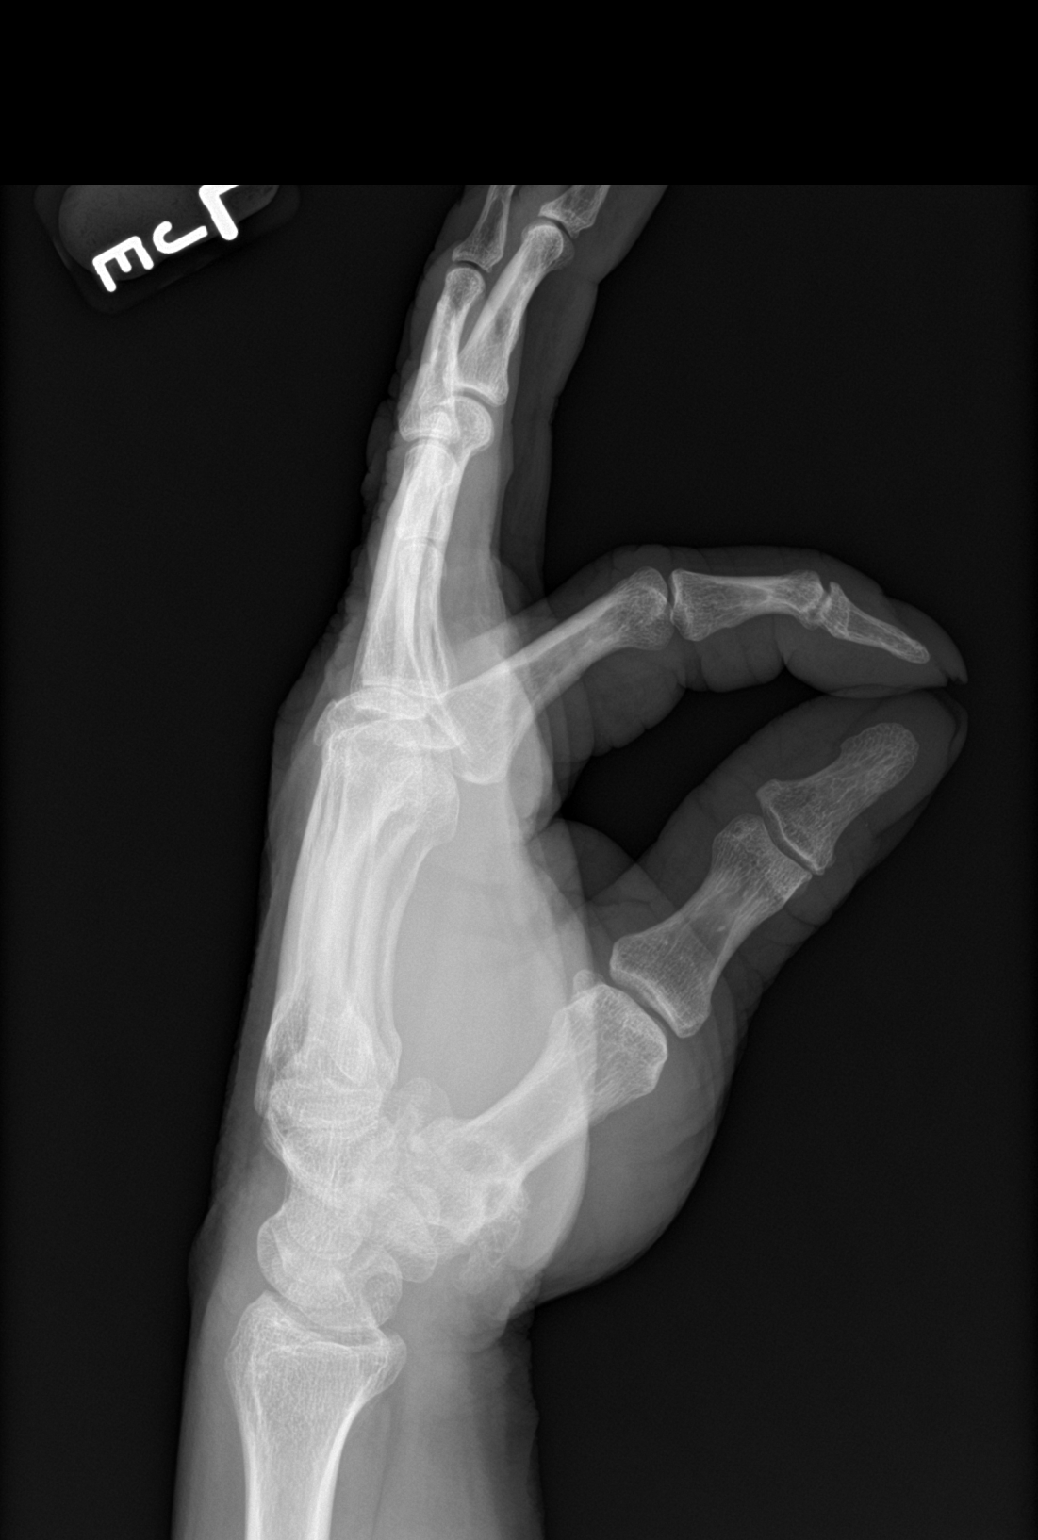

[3 of 3 positions shown; findings below may reference images not displayed]

FINDINGS: Severe thumb carpometacarpal joint space narrowing subchondral
sclerosis and peripheral osteophytosis. Mild-to-moderate index
finger DIP and mild thumb interphalangeal joint space narrowing and
peripheral osteophytosis. Moderate to severe third and mild second
and fourth metacarpophalangeal joint space narrowing. Moderate to
severe triscaphe joint space narrowing. No acute fracture is seen.
No dislocation.
IMPRESSION: Osteoarthritis greatest within the thumb carpometacarpal, triscaphe,
and third metacarpophalangeal joints.
